# Patient Record
Sex: Male | Born: 1935 | Race: White | Hispanic: No | Smoking: Never smoker
Health system: Southern US, Community
[De-identification: ages and names within clinical notes are randomized; demographics above are authoritative.]

## PROBLEM LIST (undated history)

## (undated) DIAGNOSIS — I10 Essential (primary) hypertension: Secondary | ICD-10-CM

## (undated) DIAGNOSIS — E785 Hyperlipidemia, unspecified: Secondary | ICD-10-CM

## (undated) DIAGNOSIS — I2510 Atherosclerotic heart disease of native coronary artery without angina pectoris: Secondary | ICD-10-CM

## (undated) DIAGNOSIS — G629 Polyneuropathy, unspecified: Secondary | ICD-10-CM

## (undated) HISTORY — PX: MEDIAL PARTIAL KNEE REPLACEMENT: SHX5965

## (undated) HISTORY — PX: CORONARY ANGIOPLASTY WITH STENT PLACEMENT: SHX49

---

## 2020-01-21 ENCOUNTER — Encounter (HOSPITAL_COMMUNITY): Payer: Self-pay | Admitting: Emergency Medicine

## 2020-01-21 ENCOUNTER — Other Ambulatory Visit: Payer: Self-pay

## 2020-01-21 ENCOUNTER — Inpatient Hospital Stay (HOSPITAL_COMMUNITY)
Admission: EM | Disposition: A | Discharge status: Skilled Nursing Facility | Payer: Self-pay | Source: Home / Self Care | Attending: Thoracic Surgery (Cardiothoracic Vascular Surgery)

## 2020-01-21 ENCOUNTER — Inpatient Hospital Stay (HOSPITAL_COMMUNITY)
Admission: EM | Admit: 2020-01-21 | Discharge: 2020-02-06 | DRG: 233 | Disposition: A | Discharge status: Skilled Nursing Facility | Payer: Medicare Other | Attending: Thoracic Surgery (Cardiothoracic Vascular Surgery) | Admitting: Thoracic Surgery (Cardiothoracic Vascular Surgery)

## 2020-01-21 ENCOUNTER — Emergency Department (HOSPITAL_COMMUNITY): Payer: Medicare Other

## 2020-01-21 ENCOUNTER — Inpatient Hospital Stay (HOSPITAL_COMMUNITY): Payer: Medicare Other

## 2020-01-21 DIAGNOSIS — Z96653 Presence of artificial knee joint, bilateral: Secondary | ICD-10-CM | POA: Diagnosis present

## 2020-01-21 DIAGNOSIS — E7800 Pure hypercholesterolemia, unspecified: Secondary | ICD-10-CM

## 2020-01-21 DIAGNOSIS — E871 Hypo-osmolality and hyponatremia: Secondary | ICD-10-CM | POA: Diagnosis not present

## 2020-01-21 DIAGNOSIS — E861 Hypovolemia: Secondary | ICD-10-CM | POA: Diagnosis present

## 2020-01-21 DIAGNOSIS — N183 Chronic kidney disease, stage 3 unspecified: Secondary | ICD-10-CM | POA: Diagnosis present

## 2020-01-21 DIAGNOSIS — I9581 Postprocedural hypotension: Secondary | ICD-10-CM | POA: Diagnosis not present

## 2020-01-21 DIAGNOSIS — N179 Acute kidney failure, unspecified: Secondary | ICD-10-CM

## 2020-01-21 DIAGNOSIS — Z7901 Long term (current) use of anticoagulants: Secondary | ICD-10-CM | POA: Diagnosis not present

## 2020-01-21 DIAGNOSIS — Z955 Presence of coronary angioplasty implant and graft: Secondary | ICD-10-CM | POA: Diagnosis not present

## 2020-01-21 DIAGNOSIS — I480 Paroxysmal atrial fibrillation: Secondary | ICD-10-CM | POA: Diagnosis not present

## 2020-01-21 DIAGNOSIS — Z9889 Other specified postprocedural states: Secondary | ICD-10-CM

## 2020-01-21 DIAGNOSIS — I2510 Atherosclerotic heart disease of native coronary artery without angina pectoris: Secondary | ICD-10-CM | POA: Diagnosis present

## 2020-01-21 DIAGNOSIS — I252 Old myocardial infarction: Secondary | ICD-10-CM | POA: Diagnosis not present

## 2020-01-21 DIAGNOSIS — N184 Chronic kidney disease, stage 4 (severe): Secondary | ICD-10-CM | POA: Diagnosis not present

## 2020-01-21 DIAGNOSIS — Z20822 Contact with and (suspected) exposure to covid-19: Secondary | ICD-10-CM | POA: Diagnosis present

## 2020-01-21 DIAGNOSIS — I5043 Acute on chronic combined systolic (congestive) and diastolic (congestive) heart failure: Secondary | ICD-10-CM | POA: Diagnosis present

## 2020-01-21 DIAGNOSIS — R627 Adult failure to thrive: Secondary | ICD-10-CM | POA: Diagnosis present

## 2020-01-21 DIAGNOSIS — I340 Nonrheumatic mitral (valve) insufficiency: Secondary | ICD-10-CM

## 2020-01-21 DIAGNOSIS — E1122 Type 2 diabetes mellitus with diabetic chronic kidney disease: Secondary | ICD-10-CM | POA: Diagnosis present

## 2020-01-21 DIAGNOSIS — D62 Acute posthemorrhagic anemia: Secondary | ICD-10-CM | POA: Diagnosis not present

## 2020-01-21 DIAGNOSIS — I513 Intracardiac thrombosis, not elsewhere classified: Secondary | ICD-10-CM | POA: Diagnosis present

## 2020-01-21 DIAGNOSIS — N390 Urinary tract infection, site not specified: Secondary | ICD-10-CM | POA: Diagnosis not present

## 2020-01-21 DIAGNOSIS — Z452 Encounter for adjustment and management of vascular access device: Secondary | ICD-10-CM

## 2020-01-21 DIAGNOSIS — I351 Nonrheumatic aortic (valve) insufficiency: Secondary | ICD-10-CM | POA: Diagnosis not present

## 2020-01-21 DIAGNOSIS — Z6826 Body mass index (BMI) 26.0-26.9, adult: Secondary | ICD-10-CM | POA: Diagnosis not present

## 2020-01-21 DIAGNOSIS — L8990 Pressure ulcer of unspecified site, unspecified stage: Secondary | ICD-10-CM | POA: Insufficient documentation

## 2020-01-21 DIAGNOSIS — J939 Pneumothorax, unspecified: Secondary | ICD-10-CM

## 2020-01-21 DIAGNOSIS — D696 Thrombocytopenia, unspecified: Secondary | ICD-10-CM | POA: Diagnosis not present

## 2020-01-21 DIAGNOSIS — E785 Hyperlipidemia, unspecified: Secondary | ICD-10-CM | POA: Diagnosis present

## 2020-01-21 DIAGNOSIS — I10 Essential (primary) hypertension: Secondary | ICD-10-CM | POA: Diagnosis not present

## 2020-01-21 DIAGNOSIS — I5021 Acute systolic (congestive) heart failure: Secondary | ICD-10-CM | POA: Diagnosis not present

## 2020-01-21 DIAGNOSIS — Z951 Presence of aortocoronary bypass graft: Secondary | ICD-10-CM

## 2020-01-21 DIAGNOSIS — I25110 Atherosclerotic heart disease of native coronary artery with unstable angina pectoris: Secondary | ICD-10-CM | POA: Diagnosis present

## 2020-01-21 DIAGNOSIS — I361 Nonrheumatic tricuspid (valve) insufficiency: Secondary | ICD-10-CM

## 2020-01-21 DIAGNOSIS — I214 Non-ST elevation (NSTEMI) myocardial infarction: Secondary | ICD-10-CM | POA: Diagnosis present

## 2020-01-21 DIAGNOSIS — Z8249 Family history of ischemic heart disease and other diseases of the circulatory system: Secondary | ICD-10-CM | POA: Diagnosis not present

## 2020-01-21 DIAGNOSIS — Z79899 Other long term (current) drug therapy: Secondary | ICD-10-CM

## 2020-01-21 DIAGNOSIS — J9601 Acute respiratory failure with hypoxia: Secondary | ICD-10-CM

## 2020-01-21 DIAGNOSIS — Z01810 Encounter for preprocedural cardiovascular examination: Secondary | ICD-10-CM | POA: Diagnosis not present

## 2020-01-21 DIAGNOSIS — I255 Ischemic cardiomyopathy: Secondary | ICD-10-CM | POA: Diagnosis present

## 2020-01-21 DIAGNOSIS — I130 Hypertensive heart and chronic kidney disease with heart failure and stage 1 through stage 4 chronic kidney disease, or unspecified chronic kidney disease: Secondary | ICD-10-CM | POA: Diagnosis present

## 2020-01-21 DIAGNOSIS — Z4682 Encounter for fitting and adjustment of non-vascular catheter: Secondary | ICD-10-CM

## 2020-01-21 DIAGNOSIS — I200 Unstable angina: Secondary | ICD-10-CM

## 2020-01-21 DIAGNOSIS — J90 Pleural effusion, not elsewhere classified: Secondary | ICD-10-CM

## 2020-01-21 DIAGNOSIS — R079 Chest pain, unspecified: Secondary | ICD-10-CM | POA: Diagnosis present

## 2020-01-21 DIAGNOSIS — E876 Hypokalemia: Secondary | ICD-10-CM | POA: Diagnosis not present

## 2020-01-21 DIAGNOSIS — G629 Polyneuropathy, unspecified: Secondary | ICD-10-CM

## 2020-01-21 HISTORY — DX: Polyneuropathy, unspecified: G62.9

## 2020-01-21 HISTORY — DX: Hyperlipidemia, unspecified: E78.5

## 2020-01-21 HISTORY — PX: LEFT HEART CATH AND CORONARY ANGIOGRAPHY: CATH118249

## 2020-01-21 HISTORY — DX: Essential (primary) hypertension: I10

## 2020-01-21 HISTORY — DX: Atherosclerotic heart disease of native coronary artery without angina pectoris: I25.10

## 2020-01-21 HISTORY — PX: CORONARY/GRAFT ACUTE MI REVASCULARIZATION: CATH118305

## 2020-01-21 LAB — ECHOCARDIOGRAM COMPLETE
AR max vel: 2.08 cm2
AV Area VTI: 2.18 cm2
AV Area mean vel: 1.78 cm2
AV Mean grad: 5.4 mmHg
AV Peak grad: 8.9 mmHg
Ao pk vel: 1.49 m/s
Area-P 1/2: 6.6 cm2
Height: 69.5 in
MV M vel: 4.59 m/s
MV Peak grad: 84.3 mmHg
P 1/2 time: 454 msec
Radius: 0.3 cm
S' Lateral: 3.65 cm
Weight: 2867.74 oz

## 2020-01-21 LAB — COMPREHENSIVE METABOLIC PANEL
ALT: 27 U/L (ref 0–44)
AST: 44 U/L — ABNORMAL HIGH (ref 15–41)
Albumin: 3.8 g/dL (ref 3.5–5.0)
Alkaline Phosphatase: 81 U/L (ref 38–126)
Anion gap: 15 (ref 5–15)
BUN: 45 mg/dL — ABNORMAL HIGH (ref 8–23)
CO2: 19 mmol/L — ABNORMAL LOW (ref 22–32)
Calcium: 9.9 mg/dL (ref 8.9–10.3)
Chloride: 103 mmol/L (ref 98–111)
Creatinine, Ser: 1.8 mg/dL — ABNORMAL HIGH (ref 0.61–1.24)
GFR calc Af Amer: 38 mL/min — ABNORMAL LOW (ref >60)
GFR calc non Af Amer: 33 mL/min — ABNORMAL LOW (ref >60)
Glucose, Bld: 249 mg/dL — ABNORMAL HIGH (ref 70–99)
Potassium: 4 mmol/L (ref 3.5–5.1)
Sodium: 137 mmol/L (ref 135–145)
Total Bilirubin: 1.2 mg/dL (ref 0.3–1.2)
Total Protein: 7 g/dL (ref 6.5–8.1)

## 2020-01-21 LAB — SARS CORONAVIRUS 2 BY RT PCR (HOSPITAL ORDER, PERFORMED IN ~~LOC~~ HOSPITAL LAB): SARS Coronavirus 2: NEGATIVE

## 2020-01-21 LAB — HEMOGLOBIN A1C
Hgb A1c MFr Bld: 6 % — ABNORMAL HIGH (ref 4.8–5.6)
Mean Plasma Glucose: 125.5 mg/dL

## 2020-01-21 LAB — LIPID PANEL
Cholesterol: 181 mg/dL (ref 0–200)
HDL: 61 mg/dL (ref >40)
LDL Cholesterol: 107 mg/dL — ABNORMAL HIGH (ref 0–99)
Total CHOL/HDL Ratio: 3 RATIO
Triglycerides: 63 mg/dL (ref <150)
VLDL: 13 mg/dL (ref 0–40)

## 2020-01-21 LAB — TROPONIN I (HIGH SENSITIVITY)
Troponin I (High Sensitivity): 1825 ng/L (ref <18)
Troponin I (High Sensitivity): 4392 ng/L (ref <18)
Troponin I (High Sensitivity): 5974 ng/L (ref <18)

## 2020-01-21 LAB — HEPARIN LEVEL (UNFRACTIONATED): Heparin Unfractionated: 0.2 IU/mL — ABNORMAL LOW (ref 0.30–0.70)

## 2020-01-21 LAB — CBC
HCT: 41.2 % (ref 39.0–52.0)
Hemoglobin: 13.5 g/dL (ref 13.0–17.0)
MCH: 31.8 pg (ref 26.0–34.0)
MCHC: 32.8 g/dL (ref 30.0–36.0)
MCV: 97.2 fL (ref 80.0–100.0)
Platelets: 328 10*3/uL (ref 150–400)
RBC: 4.24 MIL/uL (ref 4.22–5.81)
RDW: 14.4 % (ref 11.5–15.5)
WBC: 13.8 10*3/uL — ABNORMAL HIGH (ref 4.0–10.5)
nRBC: 0 % (ref 0.0–0.2)

## 2020-01-21 LAB — GLUCOSE, CAPILLARY
Glucose-Capillary: 210 mg/dL — ABNORMAL HIGH (ref 70–99)
Glucose-Capillary: 188 mg/dL — ABNORMAL HIGH (ref 70–99)

## 2020-01-21 LAB — PROTIME-INR
INR: 1 (ref 0.8–1.2)
Prothrombin Time: 12.4 seconds (ref 11.4–15.2)

## 2020-01-21 LAB — MRSA PCR SCREENING: MRSA by PCR: NEGATIVE

## 2020-01-21 LAB — APTT: aPTT: 33 seconds (ref 24–36)

## 2020-01-21 LAB — BRAIN NATRIURETIC PEPTIDE: B Natriuretic Peptide: 393.8 pg/mL — ABNORMAL HIGH (ref 0.0–100.0)

## 2020-01-21 SURGERY — LEFT HEART CATH AND CORONARY ANGIOGRAPHY
Anesthesia: LOCAL

## 2020-01-21 MED ORDER — SODIUM CHLORIDE 0.9% FLUSH: 3.0000 mL | INTRAVENOUS | Status: DC | PRN

## 2020-01-21 MED ORDER — SODIUM CHLORIDE 0.9 % IV SOLN: INTRAVENOUS | Status: AC

## 2020-01-21 MED ORDER — NITROGLYCERIN IN D5W 200-5 MCG/ML-% IV SOLN
5.0000 ug/min | INTRAVENOUS | Status: DC
  Administered 2020-01-21: 5 ug/min via INTRAVENOUS

## 2020-01-21 MED ORDER — IOHEXOL 350 MG/ML SOLN
INTRAVENOUS | Status: DC | PRN
  Administered 2020-01-21: 50 mL

## 2020-01-21 MED ORDER — MIDAZOLAM HCL 2 MG/2ML IJ SOLN
INTRAMUSCULAR | Status: AC
  Filled 2020-01-21: qty 2

## 2020-01-21 MED ORDER — HEPARIN (PORCINE) IN NACL 1000-0.9 UT/500ML-% IV SOLN
INTRAVENOUS | Status: AC
  Filled 2020-01-21: qty 1500

## 2020-01-21 MED ORDER — HYDRALAZINE HCL 20 MG/ML IJ SOLN: 10.0000 mg | INTRAMUSCULAR | Status: AC | PRN

## 2020-01-21 MED ORDER — MIDAZOLAM HCL 2 MG/2ML IJ SOLN
INTRAMUSCULAR | Status: DC | PRN
  Administered 2020-01-21: 0.5 mg via INTRAVENOUS

## 2020-01-21 MED ORDER — ONDANSETRON HCL 4 MG/2ML IJ SOLN
INTRAMUSCULAR | Status: AC
  Administered 2020-01-21: 4 mg via INTRAVENOUS
  Filled 2020-01-21: qty 2

## 2020-01-21 MED ORDER — ASPIRIN 81 MG PO CHEW
324.0000 mg | CHEWABLE_TABLET | Freq: Once | ORAL | Status: AC
  Administered 2020-01-21: 324 mg via ORAL

## 2020-01-21 MED ORDER — NITROGLYCERIN 1 MG/10 ML FOR IR/CATH LAB
INTRA_ARTERIAL | Status: AC
  Filled 2020-01-21: qty 10

## 2020-01-21 MED ORDER — PROSIGHT PO TABS
1.0000 | ORAL_TABLET | Freq: Two times a day (BID) | ORAL | Status: DC
  Administered 2020-01-21 – 2020-02-06 (×31): 1 via ORAL
  Filled 2020-01-21 (×33): qty 1

## 2020-01-21 MED ORDER — VERAPAMIL HCL 2.5 MG/ML IV SOLN
INTRAVENOUS | Status: DC | PRN
  Administered 2020-01-21: 10 mL via INTRA_ARTERIAL

## 2020-01-21 MED ORDER — LIDOCAINE HCL (PF) 1 % IJ SOLN
INTRAMUSCULAR | Status: AC
  Filled 2020-01-21: qty 30

## 2020-01-21 MED ORDER — LABETALOL HCL 5 MG/ML IV SOLN: 10.0000 mg | INTRAVENOUS | Status: AC | PRN

## 2020-01-21 MED ORDER — NITROGLYCERIN IN D5W 200-5 MCG/ML-% IV SOLN
INTRAVENOUS | Status: AC | PRN
  Administered 2020-01-21: 5 ug/min via INTRAVENOUS

## 2020-01-21 MED ORDER — CHLORHEXIDINE GLUCONATE 0.12 % MT SOLN
15.0000 mL | Freq: Two times a day (BID) | OROMUCOSAL | Status: DC
  Administered 2020-01-21 – 2020-01-22 (×4): 15 mL via OROMUCOSAL
  Filled 2020-01-21 (×3): qty 15

## 2020-01-21 MED ORDER — ONDANSETRON HCL 4 MG/2ML IJ SOLN
4.0000 mg | Freq: Four times a day (QID) | INTRAMUSCULAR | Status: DC | PRN
  Administered 2020-01-21: 4 mg via INTRAVENOUS
  Filled 2020-01-21 (×2): qty 2

## 2020-01-21 MED ORDER — HEPARIN SODIUM (PORCINE) 1000 UNIT/ML IJ SOLN
INTRAMUSCULAR | Status: AC
  Filled 2020-01-21: qty 1

## 2020-01-21 MED ORDER — NITROGLYCERIN 0.4 MG SL SUBL: 0.4000 mg | SUBLINGUAL_TABLET | SUBLINGUAL | Status: DC | PRN

## 2020-01-21 MED ORDER — HEPARIN SODIUM (PORCINE) 5000 UNIT/ML IJ SOLN
4000.0000 [IU] | Freq: Once | INTRAMUSCULAR | Status: AC
  Administered 2020-01-21: 4000 [IU] via INTRAVENOUS

## 2020-01-21 MED ORDER — LIDOCAINE HCL (PF) 1 % IJ SOLN
INTRAMUSCULAR | Status: DC | PRN
  Administered 2020-01-21: 2 mL

## 2020-01-21 MED ORDER — ATORVASTATIN CALCIUM 80 MG PO TABS
80.0000 mg | ORAL_TABLET | Freq: Every day | ORAL | Status: DC
  Administered 2020-01-22 – 2020-02-06 (×16): 80 mg via ORAL
  Filled 2020-01-21 (×16): qty 1

## 2020-01-21 MED ORDER — MELATONIN 3 MG PO TABS
3.0000 mg | ORAL_TABLET | Freq: Every day | ORAL | Status: DC
  Administered 2020-01-21 – 2020-02-05 (×16): 3 mg via ORAL
  Filled 2020-01-21 (×17): qty 1

## 2020-01-21 MED ORDER — NITROGLYCERIN IN D5W 200-5 MCG/ML-% IV SOLN
INTRAVENOUS | Status: AC
  Filled 2020-01-21: qty 250

## 2020-01-21 MED ORDER — HEPARIN (PORCINE) 25000 UT/250ML-% IV SOLN
1100.0000 [IU]/h | INTRAVENOUS | Status: DC
  Filled 2020-01-21: qty 250

## 2020-01-21 MED ORDER — CHLORHEXIDINE GLUCONATE CLOTH 2 % EX PADS
6.0000 | MEDICATED_PAD | Freq: Every day | CUTANEOUS | Status: DC
  Administered 2020-01-21: 6 via TOPICAL

## 2020-01-21 MED ORDER — ACETAMINOPHEN 325 MG PO TABS
650.0000 mg | ORAL_TABLET | ORAL | Status: DC | PRN
  Administered 2020-01-22 (×3): 650 mg via ORAL
  Filled 2020-01-21 (×3): qty 2

## 2020-01-21 MED ORDER — HEPARIN BOLUS VIA INFUSION
1000.0000 [IU] | Freq: Once | INTRAVENOUS | Status: AC
  Administered 2020-01-21: 1000 [IU] via INTRAVENOUS
  Filled 2020-01-21: qty 1000

## 2020-01-21 MED ORDER — SODIUM CHLORIDE 0.9% FLUSH
3.0000 mL | Freq: Two times a day (BID) | INTRAVENOUS | Status: DC
  Administered 2020-01-21 – 2020-01-22 (×4): 3 mL via INTRAVENOUS

## 2020-01-21 MED ORDER — MELATONIN 3 MG PO TABS: 3.0000 mg | ORAL_TABLET | Freq: Every day | ORAL | Status: DC

## 2020-01-21 MED ORDER — PROCHLORPERAZINE EDISYLATE 10 MG/2ML IJ SOLN
10.0000 mg | INTRAMUSCULAR | Status: DC | PRN
  Administered 2020-01-21 – 2020-01-22 (×3): 10 mg via INTRAVENOUS
  Filled 2020-01-21 (×4): qty 2

## 2020-01-21 MED ORDER — SODIUM CHLORIDE 0.9% FLUSH: 3.0000 mL | Freq: Once | INTRAVENOUS | Status: DC

## 2020-01-21 MED ORDER — PERFLUTREN LIPID MICROSPHERE
1.0000 mL | INTRAVENOUS | Status: AC | PRN
  Administered 2020-01-21: 3 mL via INTRAVENOUS
  Filled 2020-01-21: qty 10

## 2020-01-21 MED ORDER — ORAL CARE MOUTH RINSE
15.0000 mL | Freq: Two times a day (BID) | OROMUCOSAL | Status: DC
  Administered 2020-01-21 – 2020-01-22 (×4): 15 mL via OROMUCOSAL

## 2020-01-21 MED ORDER — ONDANSETRON HCL 4 MG/2ML IJ SOLN
4.0000 mg | Freq: Once | INTRAMUSCULAR | Status: AC
  Administered 2020-01-21: 4 mg via INTRAVENOUS

## 2020-01-21 MED ORDER — HEPARIN SODIUM (PORCINE) 1000 UNIT/ML IJ SOLN
INTRAMUSCULAR | Status: DC | PRN
  Administered 2020-01-21: 4000 [IU] via INTRAVENOUS

## 2020-01-21 MED ORDER — HEPARIN (PORCINE) IN NACL 1000-0.9 UT/500ML-% IV SOLN
INTRAVENOUS | Status: DC | PRN
  Administered 2020-01-21 (×2): 500 mL

## 2020-01-21 MED ORDER — ASPIRIN 81 MG PO CHEW
81.0000 mg | CHEWABLE_TABLET | Freq: Every day | ORAL | Status: DC
  Administered 2020-01-22: 81 mg via ORAL
  Filled 2020-01-21 (×2): qty 1

## 2020-01-21 MED ORDER — VERAPAMIL HCL 2.5 MG/ML IV SOLN
INTRAVENOUS | Status: AC
  Filled 2020-01-21: qty 2

## 2020-01-21 MED ORDER — FENTANYL CITRATE (PF) 100 MCG/2ML IJ SOLN
INTRAMUSCULAR | Status: DC | PRN
  Administered 2020-01-21: 25 ug via INTRAVENOUS

## 2020-01-21 MED ORDER — FENTANYL CITRATE (PF) 100 MCG/2ML IJ SOLN
INTRAMUSCULAR | Status: AC
  Filled 2020-01-21: qty 2

## 2020-01-21 MED ORDER — SODIUM CHLORIDE 0.9 % IV SOLN: 250.0000 mL | INTRAVENOUS | Status: DC | PRN

## 2020-01-21 MED ORDER — HEPARIN (PORCINE) 25000 UT/250ML-% IV SOLN
1250.0000 [IU]/h | INTRAVENOUS | Status: DC
  Administered 2020-01-21: 1100 [IU]/h via INTRAVENOUS
  Filled 2020-01-21 (×2): qty 250

## 2020-01-21 SURGICAL SUPPLY — 10 items
CATH 5FR JL3.5 JR4 ANG PIG MP (CATHETERS) ×2 IMPLANT
DEVICE RAD COMP TR BAND LRG (VASCULAR PRODUCTS) ×2 IMPLANT
GLIDESHEATH SLEND A-KIT 6F 22G (SHEATH) ×2 IMPLANT
GUIDEWIRE INQWIRE 1.5J.035X260 (WIRE) ×1 IMPLANT
INQWIRE 1.5J .035X260CM (WIRE) ×2
KIT HEART LEFT (KITS) ×2 IMPLANT
PACK CARDIAC CATHETERIZATION (CUSTOM PROCEDURE TRAY) ×2 IMPLANT
SHEATH PROBE COVER 6X72 (BAG) ×2 IMPLANT
TRANSDUCER W/STOPCOCK (MISCELLANEOUS) ×2 IMPLANT
TUBING CIL FLEX 10 FLL-RA (TUBING) ×2 IMPLANT

## 2020-01-21 NOTE — Progress Notes (Signed)
Progress Note  Patient Name: Bryan Dunn Date of Encounter: 01/21/2020  [facility] Cardiologist: Fransico Him, MD   Subjective   Patient admitted early this morning with epigastric pain nausea vomiting diarrhea and chest pain.  He was brought to the Cath Lab earlier this morning by Dr. Tamala Julian who revealed three-vessel calcification with left main/three-vessel disease with moderate LV dysfunction.  He was thought not to be a percutaneous interventional candidate and [facility] was consulted.  He continues to have epigastric discomfort with borderline hypotension.  Inpatient Medications    Scheduled Meds: . aspirin  81 mg Oral Daily  . atorvastatin  80 mg Oral Daily  . chlorhexidine  15 mL Mouth Rinse BID  . mouth rinse  15 mL Mouth Rinse q12n4p  . melatonin  3 mg Oral QHS  . ondansetron      . sodium chloride flush  3 mL Intravenous Once  . sodium chloride flush  3 mL Intravenous Q12H   Continuous Infusions: . sodium chloride    . heparin    . nitroGLYCERIN 5 mcg/min (01/21/20 0515)   PRN Meds: sodium chloride, acetaminophen, hydrALAZINE, labetalol, nitroGLYCERIN, ondansetron (ZOFRAN) IV, sodium chloride flush   Vital Signs    Vitals:   01/21/20 0800 01/21/20 0830 01/21/20 0900 01/21/20 0930  BP: 106/80 100/67 (!) 96/62 91/69  Pulse: 99 100 102 101  Resp: 21 21 20 16   Temp:      TempSrc:      SpO2: 96% 92% (!) 89% 92%  Weight:      Height:        Intake/Output Summary (Last 24 hours) at 01/21/2020 1009 Last data filed at 01/21/2020 0956 Gross per 24 hour  Intake --  Output 1 ml  Net -1 ml   Last 3 Weights 01/21/2020 01/21/2020  Weight (lbs) 179 lb 3.7 oz 174 lb  Weight (kg) 81.3 kg 78.926 kg      Telemetry    Sinus rhythm- Personally Reviewed  ECG    Sinus tachycardia 108 with ST segment elevation in lead aVR in lead III and diffuse ST segment depression otherwise.- Personally Reviewed  Physical Exam   GEN: No acute distress.   Neck: No JVD Cardiac:  RRR, no murmurs, rubs, or gallops.  Respiratory: Clear to auscultation bilaterally. GI: Soft, nontender, non-distended  MS: No edema; No deformity. Neuro:  Nonfocal  Psych: Normal affect  Extremities: Right radial puncture site stable  Labs    High Sensitivity Troponin:   Recent Labs  Lab 01/21/20 0325 01/21/20 0702 01/21/20 0851  TROPONINIHS 1,825* 4,392* 5,974*      Chemistry Recent Labs  Lab 01/21/20 0325  NA 137  K 4.0  CL 103  CO2 19*  GLUCOSE 249*  BUN 45*  CREATININE 1.80*  CALCIUM 9.9  PROT 7.0  ALBUMIN 3.8  AST 44*  ALT 27  ALKPHOS 81  BILITOT 1.2  GFRNONAA 33*  GFRAA 38*  ANIONGAP 15     Hematology Recent Labs  Lab 01/21/20 0325  WBC 13.8*  RBC 4.24  HGB 13.5  HCT 41.2  MCV 97.2  MCH 31.8  MCHC 32.8  RDW 14.4  PLT 328    BNP Recent Labs  Lab 01/21/20 0702  BNP 393.8*     DDimer No results for input(s): DDIMER in the last 168 hours.   Radiology    CARDIAC CATHETERIZATION  Result Date: 01/21/2020  Heavily calcified severe three-vessel coronary artery disease.  Decreased LVEF, 35 to 40% with anteroapical  moderate hypokinesis.  LVEDP 22%.  Acute on chronic combined systolic and diastolic heart failure.  30% proximal left main  Heavily calcified 95% proximal LAD followed by total occlusion in the mid vessel.  99% ostial to proximal circumflex proximal to a previously placed stent which is patent.  2 large obtuse marginal branches are noted distally.  RCA is dominant.  There is eccentric 85% possible acute lesion in the RCA.  Segmental 75 to 90% distal RCA with severe diffuse disease in the PDA and LV branches with left to right collaterals.  Large acute marginal branch supplies collaterals around the apex to the LAD. RECOMMENDATIONS:  Guarded prognosis given age, LV function, and multifocal high-grade three-vessel CAD.  Needs to be evaluated to determine if he is a surgical candidate.  IV nitroglycerin  Resume IV heparin  Cycle  cardiac markers   DG Chest Portable 1 View  Result Date: 01/21/2020 CLINICAL DATA:  Chest pain EXAM: PORTABLE CHEST 1 VIEW COMPARISON:  None. FINDINGS: Cardiac shadow is at the upper limits of normal in size. Increased vascular congestion with interstitial edema is noted consistent with CHF. No bony abnormality is seen. No sizable effusion is noted. IMPRESSION: Changes of CHF. Electronically Signed   By: Inez Catalina M.D.   On: 01/21/2020 03:56    Cardiac Studies   Chronic catheterization (01/21/2020)  Conclusion   Heavily calcified severe three-vessel coronary artery disease.  Decreased LVEF, 35 to 40% with anteroapical moderate hypokinesis.  LVEDP 22%.  Acute on chronic combined systolic and diastolic heart failure.  30% proximal left main  Heavily calcified 95% proximal LAD followed by total occlusion in the mid vessel.  99% ostial to proximal circumflex proximal to a previously placed stent which is patent.  2 large obtuse marginal branches are noted distally.  RCA is dominant.  There is eccentric 85% possible acute lesion in the RCA.  Segmental 75 to 90% distal RCA with severe diffuse disease in the PDA and LV branches with left to right collaterals.  Large acute marginal branch supplies collaterals around the apex to the LAD.  RECOMMENDATIONS:   Guarded prognosis given age, LV function, and multifocal high-grade three-vessel CAD.  Needs to be evaluated to determine if he is a surgical candidate.  IV nitroglycerin  Resume IV heparin  Cycle cardiac markers  Coronary Diagrams  Diagnostic Dominance: Right  Intervention  Implants      Patient Profile     86 y.o. male married Caucasian male who lives independently with his wife.  He goes to the gym 3 days a week.  He does have a history remote CAD status post intervention in [place] over 20 years ago.  He has treated hypertension hyperlipidemia.  He is had recent chest pain with pain that was worse last  night/this morning and was brought to the ER where he was evaluated by Dr. Radford Pax and take him urgently to the Cath Lab by Dr. Tamala Julian revealing highly calcified vessels with severe left main/three-vessel disease and moderate LV dysfunction.  [facility] was consulted.  Assessment & Plan    1: Coronary artery disease-troponin is 4000.  His EKG shows ST segment elevation in lead aVR with depression otherwise suggesting global subendocardial ischemia.  Cath revealed severe calcified three-vessel disease with severe left main and subtotally occluded ostial circumflex, subtotally occluded mid LAD with right to left collaterals.  His anatomy is not suitable for percutaneous intervention.  He is high risk for CABG but this may be his only option.  He is otherwise independent.  Dr. Roxy Manns from [facility] is going to be seeing him.  He apparently had PCI over 20 years ago with a stent in his proximal circumflex.  Go to restart his IV heparin without a bolus.  He is on low-dose nitroglycerin as well.  2: Essential hypertension-on ARB and diltiazem as an outpatient.  His blood pressure is soft and these are currently on hold.  3: Hyperlipidemia-on atorvastatin with total cholesterol 181 and HDL 107.  For questions or updates, please contact [place] Please consult [email or web address] for contact info under        Signed, Quay Burow, MD  01/21/2020, 10:09 AM

## 2020-01-21 NOTE — Progress Notes (Signed)
  Echocardiogram 2D Echocardiogram has been performed.  Jennette Dubin 01/21/2020, 4:42 PM

## 2020-01-21 NOTE — ED Notes (Signed)
Cardiology at bedside.

## 2020-01-21 NOTE — ED Provider Notes (Signed)
Emergency Department Provider Note   I have reviewed the triage vital signs and the nursing notes.   HISTORY  Chief Complaint Chest Pain and Code STEMI   HPI Bryan Dunn is a 86 y.o. male with past history of hypertension and high cholesterol presents with several nights of discomfort in the chest and shoulder along with nausea.  He came in tonight because he was having trouble sleeping due to the symptoms.  Symptoms occur mainly at night over the past 3 days.  He recently started some extended release diltiazem which he takes at night and had initially attributed the symptoms to that medication.  He is not having diaphoresis.  His discomfort at this time is 3 out of 10 in severity and nonradiating.  He has no prior history of CAD and does not have a cardiologist.  Past Medical History:  Diagnosis Date  . Benign essential HTN   . CAD (coronary artery disease)    remote PCI > 20 years ago in [place]  . HLD (hyperlipidemia)   . Neuropathy     Patient Active Problem List   Diagnosis Date Noted  . Unstable angina ([place]) 01/21/2020  . CAD in native artery 01/21/2020  . NSTEMI (non-ST elevated myocardial infarction) ([place]) 01/21/2020  . Benign essential HTN   . HLD (hyperlipidemia)   . Neuropathy   . CAD (coronary artery disease)     Past Surgical History:  Procedure Laterality Date  . CORONARY ANGIOPLASTY WITH STENT PLACEMENT    . CORONARY/GRAFT ACUTE MI REVASCULARIZATION N/A 01/21/2020   Procedure: Coronary/Graft Acute MI Revascularization;  Surgeon: Belva Crome, MD;  Location: [facility];  Service: Cardiovascular;  Laterality: N/A;  . LEFT HEART CATH AND CORONARY ANGIOGRAPHY N/A 01/21/2020   Procedure: LEFT HEART CATH AND CORONARY ANGIOGRAPHY;  Surgeon: Belva Crome, MD;  Location: [facility];  Service: Cardiovascular;  Laterality: N/A;  . MEDIAL PARTIAL KNEE REPLACEMENT Bilateral     Allergies Patient has no known allergies.  Family History  Problem  Relation Age of Onset  . CAD Father     Social History Social History   Tobacco Use  . Smoking status: Not on file  Substance Use Topics  . Alcohol use: Not on file  . Drug use: Not on file    Review of Systems  Constitutional: No fever/chills Eyes: No visual changes. ENT: No sore throat. Cardiovascular: Positive chest pain. Respiratory: Denies shortness of breath. Gastrointestinal: No abdominal pain.  Positive nausea, vomiting, and diarrhea.  No constipation. Genitourinary: Negative for dysuria. Musculoskeletal: Negative for back pain. Skin: Negative for rash. Neurological: Negative for headaches, focal weakness or numbness.  10-point ROS otherwise negative.  ____________________________________________   PHYSICAL EXAM:  VITAL SIGNS: ED Triage Vitals  Enc Vitals Group     BP 01/21/20 0307 126/82     Pulse Rate 01/21/20 0307 (!) 108     Resp 01/21/20 0307 18     Temp 01/21/20 0307 97.9 F (36.6 C)     Temp Source 01/21/20 0307 Oral     SpO2 01/21/20 0307 96 %     Weight 01/21/20 0307 174 lb (78.9 kg)     Height 01/21/20 0307 5' 9.5" (1.765 m)   Constitutional: Alert and oriented. Well appearing and in no acute distress. Eyes: Conjunctivae are normal.  Head: Atraumatic. Nose: No congestion/rhinnorhea. Mouth/Throat: Mucous membranes are moist.   Neck: No stridor.   Cardiovascular: Normal rate, regular rhythm. Good peripheral circulation. Grossly normal  heart sounds.   Respiratory: Normal respiratory effort.  No retractions. Lungs CTAB. Gastrointestinal: Soft and nontender. No distention.  Musculoskeletal: No lower extremity tenderness nor edema. No gross deformities of extremities. Neurologic:  Normal speech and language. No gross focal neurologic deficits are appreciated.  Skin:  Skin is warm, dry and intact. No rash noted.  ____________________________________________   LABS (all labs ordered are listed, but only abnormal results are displayed)  Labs  Reviewed  CBC - Abnormal; Notable for the following components:      Result Value   WBC 13.8 (*)    All other components within normal limits  HEMOGLOBIN A1C - Abnormal; Notable for the following components:   Hgb A1c MFr Bld 6.0 (*)    All other components within normal limits  COMPREHENSIVE METABOLIC PANEL - Abnormal; Notable for the following components:   CO2 19 (*)    Glucose, Bld 249 (*)    BUN 45 (*)    Creatinine, Ser 1.80 (*)    AST 44 (*)    GFR calc non Af Amer 33 (*)    GFR calc Af Amer 38 (*)    All other components within normal limits  LIPID PANEL - Abnormal; Notable for the following components:   LDL Cholesterol 107 (*)    All other components within normal limits  BRAIN NATRIURETIC PEPTIDE - Abnormal; Notable for the following components:   B Natriuretic Peptide 393.8 (*)    All other components within normal limits  HEPARIN LEVEL (UNFRACTIONATED) - Abnormal; Notable for the following components:   Heparin Unfractionated 0.20 (*)    All other components within normal limits  GLUCOSE, CAPILLARY - Abnormal; Notable for the following components:   Glucose-Capillary 210 (*)    All other components within normal limits  CBC - Abnormal; Notable for the following components:   WBC 13.4 (*)    RBC 4.08 (*)    All other components within normal limits  COMPREHENSIVE METABOLIC PANEL - Abnormal; Notable for the following components:   CO2 19 (*)    Glucose, Bld 179 (*)    BUN 43 (*)    Creatinine, Ser 1.37 (*)    Total Protein 6.3 (*)    Albumin 3.3 (*)    AST 105 (*)    Total Bilirubin 1.4 (*)    GFR calc non Af Amer 46 (*)    GFR calc Af Amer 53 (*)    All other components within normal limits  LIPID PANEL - Abnormal; Notable for the following components:   LDL Cholesterol 112 (*)    All other components within normal limits  GLUCOSE, CAPILLARY - Abnormal; Notable for the following components:   Glucose-Capillary 188 (*)    All other components within normal  limits  GLUCOSE, CAPILLARY - Abnormal; Notable for the following components:   Glucose-Capillary 168 (*)    All other components within normal limits  GLUCOSE, CAPILLARY - Abnormal; Notable for the following components:   Glucose-Capillary 212 (*)    All other components within normal limits  GLUCOSE, CAPILLARY - Abnormal; Notable for the following components:   Glucose-Capillary 215 (*)    All other components within normal limits  HEPARIN LEVEL (UNFRACTIONATED) - Abnormal; Notable for the following components:   Heparin Unfractionated 0.20 (*)    All other components within normal limits  COMPREHENSIVE METABOLIC PANEL - Abnormal; Notable for the following components:   Sodium 134 (*)    CO2 20 (*)    Glucose, Bld  190 (*)    BUN 53 (*)    Creatinine, Ser 1.66 (*)    Albumin 3.2 (*)    AST 64 (*)    Total Bilirubin 1.6 (*)    GFR calc non Af Amer 37 (*)    GFR calc Af Amer 42 (*)    All other components within normal limits  CBC - Abnormal; Notable for the following components:   WBC 14.9 (*)    RBC 4.10 (*)    All other components within normal limits  GLUCOSE, CAPILLARY - Abnormal; Notable for the following components:   Glucose-Capillary 179 (*)    All other components within normal limits  HEMOGLOBIN AND HEMATOCRIT, BLOOD - Abnormal; Notable for the following components:   Hemoglobin 8.8 (*)    HCT 25.7 (*)    All other components within normal limits  CBC - Abnormal; Notable for the following components:   WBC 15.5 (*)    RBC 2.86 (*)    Hemoglobin 9.2 (*)    HCT 28.1 (*)    Platelets 141 (*)    All other components within normal limits  PROTIME-INR - Abnormal; Notable for the following components:   Prothrombin Time 18.2 (*)    INR 1.6 (*)    All other components within normal limits  APTT - Abnormal; Notable for the following components:   aPTT 40 (*)    All other components within normal limits  CBC - Abnormal; Notable for the following components:   WBC  11.6 (*)    RBC 3.16 (*)    Hemoglobin 10.1 (*)    HCT 30.7 (*)    Platelets 125 (*)    All other components within normal limits  BASIC METABOLIC PANEL - Abnormal; Notable for the following components:   Glucose, Bld 150 (*)    BUN 47 (*)    Creatinine, Ser 1.46 (*)    Calcium 8.0 (*)    GFR calc non Af Amer 43 (*)    GFR calc Af Amer 49 (*)    All other components within normal limits  MAGNESIUM - Abnormal; Notable for the following components:   Magnesium 3.3 (*)    All other components within normal limits  GLUCOSE, CAPILLARY - Abnormal; Notable for the following components:   Glucose-Capillary 154 (*)    All other components within normal limits  GLUCOSE, CAPILLARY - Abnormal; Notable for the following components:   Glucose-Capillary 173 (*)    All other components within normal limits  GLUCOSE, CAPILLARY - Abnormal; Notable for the following components:   Glucose-Capillary 172 (*)    All other components within normal limits  GLUCOSE, CAPILLARY - Abnormal; Notable for the following components:   Glucose-Capillary 158 (*)    All other components within normal limits  GLUCOSE, CAPILLARY - Abnormal; Notable for the following components:   Glucose-Capillary 152 (*)    All other components within normal limits  CBC - Abnormal; Notable for the following components:   RBC 2.84 (*)    Hemoglobin 9.1 (*)    HCT 27.2 (*)    Platelets 119 (*)    All other components within normal limits  BASIC METABOLIC PANEL - Abnormal; Notable for the following components:   Glucose, Bld 139 (*)    BUN 45 (*)    Creatinine, Ser 1.45 (*)    Calcium 7.9 (*)    GFR calc non Af Amer 43 (*)    GFR calc Af Amer 50 (*)  All other components within normal limits  MAGNESIUM - Abnormal; Notable for the following components:   Magnesium 2.6 (*)    All other components within normal limits  GLUCOSE, CAPILLARY - Abnormal; Notable for the following components:   Glucose-Capillary 127 (*)    All  other components within normal limits  GLUCOSE, CAPILLARY - Abnormal; Notable for the following components:   Glucose-Capillary 139 (*)    All other components within normal limits  GLUCOSE, CAPILLARY - Abnormal; Notable for the following components:   Glucose-Capillary 148 (*)    All other components within normal limits  GLUCOSE, CAPILLARY - Abnormal; Notable for the following components:   Glucose-Capillary 150 (*)    All other components within normal limits  GLUCOSE, CAPILLARY - Abnormal; Notable for the following components:   Glucose-Capillary 129 (*)    All other components within normal limits  GLUCOSE, CAPILLARY - Abnormal; Notable for the following components:   Glucose-Capillary 128 (*)    All other components within normal limits  GLUCOSE, CAPILLARY - Abnormal; Notable for the following components:   Glucose-Capillary 145 (*)    All other components within normal limits  GLUCOSE, CAPILLARY - Abnormal; Notable for the following components:   Glucose-Capillary 139 (*)    All other components within normal limits  GLUCOSE, CAPILLARY - Abnormal; Notable for the following components:   Glucose-Capillary 132 (*)    All other components within normal limits  GLUCOSE, CAPILLARY - Abnormal; Notable for the following components:   Glucose-Capillary 136 (*)    All other components within normal limits  POCT I-STAT, CHEM 8 - Abnormal; Notable for the following components:   BUN 48 (*)    Creatinine, Ser 1.40 (*)    Glucose, Bld 203 (*)    TCO2 20 (*)    All other components within normal limits  POCT I-STAT, CHEM 8 - Abnormal; Notable for the following components:   BUN 51 (*)    Creatinine, Ser 1.50 (*)    Glucose, Bld 189 (*)    Hemoglobin 12.9 (*)    HCT 38.0 (*)    All other components within normal limits  POCT I-STAT 7, (LYTES, BLD GAS, ICA,H+H) - Abnormal; Notable for the following components:   pH, Arterial 7.315 (*)    pO2, Arterial 286 (*)    Calcium, Ion 1.07  (*)    HCT 28.0 (*)    Hemoglobin 9.5 (*)    All other components within normal limits  POCT I-STAT, CHEM 8 - Abnormal; Notable for the following components:   BUN 48 (*)    Creatinine, Ser 1.40 (*)    Glucose, Bld 165 (*)    Calcium, Ion 1.10 (*)    Hemoglobin 8.8 (*)    HCT 26.0 (*)    All other components within normal limits  POCT I-STAT 7, (LYTES, BLD GAS, ICA,H+H) - Abnormal; Notable for the following components:   pO2, Arterial 281 (*)    Calcium, Ion 1.10 (*)    HCT 26.0 (*)    Hemoglobin 8.8 (*)    All other components within normal limits  POCT I-STAT, CHEM 8 - Abnormal; Notable for the following components:   BUN 44 (*)    Glucose, Bld 140 (*)    TCO2 21 (*)    Hemoglobin 8.5 (*)    HCT 25.0 (*)    All other components within normal limits  POCT I-STAT 7, (LYTES, BLD GAS, ICA,H+H) - Abnormal; Notable for the following components:  pO2, Arterial 415 (*)    HCT 26.0 (*)    Hemoglobin 8.8 (*)    All other components within normal limits  TROPONIN I (HIGH SENSITIVITY) - Abnormal; Notable for the following components:   Troponin I (High Sensitivity) 1,825 (*)    All other components within normal limits  TROPONIN I (HIGH SENSITIVITY) - Abnormal; Notable for the following components:   Troponin I (High Sensitivity) 4,392 (*)    All other components within normal limits  TROPONIN I (HIGH SENSITIVITY) - Abnormal; Notable for the following components:   Troponin I (High Sensitivity) 5,974 (*)    All other components within normal limits  SARS CORONAVIRUS 2 BY RT PCR (HOSPITAL ORDER, [facility])  MRSA PCR SCREENING  PROTIME-INR  APTT  HEPARIN LEVEL (UNFRACTIONATED)  HEPARIN LEVEL (UNFRACTIONATED)  SODIUM, URINE, RANDOM  PLATELET COUNT  BLOOD GAS, ARTERIAL  BASIC METABOLIC PANEL  MAGNESIUM  CBC  TYPE AND SCREEN  ABO/RH   ____________________________________________  EKG   EKG Interpretation  Date/Time:  Monday January 21 2020  03:31:30 EDT Ventricular Rate:  108 PR Interval:    QRS Duration: 114 QT Interval:  347 QTC Calculation: 466 R Axis:   -24 Text Interpretation: Sinus or ectopic atrial tachycardia LVH with IVCD and secondary repol abnrm Probable inferior infarct, acute >>> Acute MI <<< Confirmed by Randal Buba, April ([id number]) on 01/22/2020 8:56:00 AM       ____________________________________________  RADIOLOGY  CXR reviewed.  ____________________________________________   PROCEDURES  Procedure(s) performed:   Procedures  CRITICAL CARE Performed by: Margette Fast Total critical care time: 30 minutes Critical care time was exclusive of separately billable procedures and treating other patients. Critical care was necessary to treat or prevent imminent or life-threatening deterioration. Critical care was time spent personally by me on the following activities: development of treatment plan with patient and/or surrogate as well as nursing, discussions with consultants, evaluation of patient's response to treatment, examination of patient, obtaining history from patient or surrogate, ordering and performing treatments and interventions, ordering and review of laboratory studies, ordering and review of radiographic studies, pulse oximetry and re-evaluation of patient's condition.  Nanda Quinton, MD Emergency Medicine  ____________________________________________   INITIAL IMPRESSION / ASSESSMENT AND PLAN / ED COURSE  Pertinent labs & imaging results that were available during my care of the patient were reviewed by me and considered in my medical decision making (see chart for details).   Patient presents to the emergency department with discomfort in the chest and shoulders with associated nausea.  I was brought in EKG from triage showing ST elevation and aVR with associated depressions in 4 through 6 and 2 through aVF.  I immediately went out to triage to see the patient.  He is having some active  chest discomfort along with nausea.  I activated a code STEMI in this setting.  He is very well-appearing with normal vitals.   Discussed patient's case with Cardiology, Dr. Tamala Julian to request admission. Patient and family (if present) updated with plan. Care transferred to Cardiology service.  I reviewed all nursing notes, vitals, pertinent old records, EKGs, labs, imaging (as available).  ____________________________________________  FINAL CLINICAL IMPRESSION(S) / ED DIAGNOSES  Final diagnoses:  Acute hypoxemic respiratory failure (HCC)  S/P CABG x 4     MEDICATIONS GIVEN DURING THIS VISIT:  Medications  labetalol (NORMODYNE) injection 10 mg (has no administration in time range)  hydrALAZINE (APRESOLINE) injection 10 mg (has no administration in time range)  0.9 %  sodium chloride infusion ( Intravenous Rate/Dose Verify 01/21/20 1000)  atorvastatin (LIPITOR) tablet 80 mg ( Oral MAR Unhold 01/23/20 1307)  melatonin tablet 3 mg (3 mg Oral Not Given 01/23/20 2237)  multivitamin (PROSIGHT) tablet 1 tablet (1 tablet Oral Given 01/24/20 0036)  perflutren lipid microspheres (DEFINITY) IV suspension (3 mLs Intravenous Given 01/21/20 1612)  lip balm (CARMEX) ointment ( Topical MAR Unhold 01/23/20 1307)  levalbuterol (XOPENEX) nebulizer solution 0.63 mg ( Nebulization MAR Unhold 01/23/20 1307)  ipratropium (ATROVENT) nebulizer solution 0.5 mg ( Nebulization MAR Unhold 01/23/20 1307)  0.45 % sodium chloride infusion ( Intravenous Stopped 01/24/20 0544)  lactated ringers infusion ( Intravenous Not Given 01/23/20 1332)  lactated ringers infusion ( Intravenous IV Pump Association 01/23/20 1331)  sodium chloride flush (NS) 0.9 % injection 3 mL (has no administration in time range)  sodium chloride flush (NS) 0.9 % injection 3 mL (has no administration in time range)  0.9 %  sodium chloride infusion (250 mLs Intravenous Not Given 01/24/20 0540)  0.9 %  sodium chloride infusion ( Intravenous IV Pump  Association 01/23/20 1355)  nitroGLYCERIN 50 mg in dextrose 5 % 250 mL (0.2 mg/mL) infusion (0 mcg/min Intravenous IV Pump Association 01/23/20 1336)  phenylephrine (NEOSYNEPHRINE) 20-0.9 MG/250ML-% infusion (75 mcg/min Intravenous IV Pump Association 01/23/20 1338)  lactated ringers infusion 500 mL (has no administration in time range)  potassium chloride 10 mEq in 50 mL *CENTRAL LINE* IVPB (10 mEq Intravenous Not Given 01/23/20 1402)  cefUROXime (ZINACEF) 1.5 g in sodium chloride 0.9 % 100 mL IVPB ( Intravenous Stopped 01/24/20 0451)  acetaminophen (TYLENOL) tablet 1,000 mg (1,000 mg Oral Given 01/24/20 0505)    Or  acetaminophen (TYLENOL) 160 MG/5ML solution 1,000 mg ( Per Tube See Alternative 01/24/20 0505)  traMADol (ULTRAM) tablet 50-100 mg (has no administration in time range)  oxyCODONE (Oxy IR/ROXICODONE) immediate release tablet 5-10 mg (5 mg Oral Given 01/24/20 0505)  morphine 2 MG/ML injection 1-4 mg (has no administration in time range)  midazolam (VERSED) injection 2 mg (has no administration in time range)  docusate sodium (COLACE) capsule 200 mg (has no administration in time range)  bisacodyl (DULCOLAX) EC tablet 10 mg (has no administration in time range)    Or  bisacodyl (DULCOLAX) suppository 10 mg (has no administration in time range)  ondansetron (ZOFRAN) injection 4 mg (has no administration in time range)  metoprolol tartrate (LOPRESSOR) injection 2.5-5 mg (has no administration in time range)  metoprolol tartrate (LOPRESSOR) tablet 12.5 mg (12.5 mg Oral Not Given 01/23/20 1713)    Or  metoprolol tartrate (LOPRESSOR) 25 mg/10 mL oral suspension 12.5 mg ( Per Tube See Alternative 01/23/20 1713)  pantoprazole (PROTONIX) EC tablet 40 mg (has no administration in time range)  dexmedetomidine (PRECEDEX) 400 MCG/100ML (4 mcg/mL) infusion (0 mcg/kg/hr  82.3 kg Intravenous Stopped 01/23/20 1538)  insulin regular, human (MYXREDLIN) 100 units/ 100 mL infusion ( Intravenous Rate/Dose  Verify 01/24/20 0600)  dextrose 50 % solution 0-50 mL (has no administration in time range)  EPINEPHrine (ADRENALIN) 4 mg in NS 250 mL (0.016 mg/mL) premix infusion (7 mcg/min Intravenous New Bag/Given 01/23/20 2024)  norepinephrine (LEVOPHED) 4mg  in 272mL premix infusion (2 mcg/min Intravenous IV Pump Association 01/23/20 1900)  milrinone (PRIMACOR) 20 MG/100 ML (0.2 mg/mL) infusion (0 mcg/kg/min  82.3 kg Intravenous Hold 01/23/20 1354)  DOBUTamine (DOBUTREX) infusion 4000 mcg/mL (9.964 mcg/kg/min  82.3 kg Intravenous IV Pump Association 01/23/20 2150)  DULoxetine (CYMBALTA) DR capsule 20 mg (has no  administration in time range)  apixaban (ELIQUIS) tablet 5 mg (has no administration in time range)  aspirin EC tablet 81 mg (has no administration in time range)    Or  aspirin chewable tablet 81 mg (has no administration in time range)  sodium chloride flush (NS) 0.9 % injection 10-40 mL (10 mLs Intracatheter Given 01/23/20 2236)  sodium chloride flush (NS) 0.9 % injection 10-40 mL (10 mLs Intracatheter Given 01/23/20 1356)  Chlorhexidine Gluconate Cloth 2 % PADS 6 each (has no administration in time range)  vasopressin (PITRESSIN) 20 Units in sodium chloride 0.9 % 100 mL infusion-*FOR SHOCK* (0.04 Units/min Intravenous New Bag/Given 01/24/20 0335)  potassium chloride 10 mEq in 50 mL *CENTRAL LINE* IVPB (10 mEq Intravenous New Bag/Given 01/24/20 0653)  aspirin chewable tablet 324 mg (324 mg Oral Given 01/21/20 0333)  heparin injection 4,000 Units (4,000 Units Intravenous Given 01/21/20 0333)  ondansetron (ZOFRAN) injection 4 mg (4 mg Intravenous Given 01/21/20 0354)  ondansetron (ZOFRAN) 4 MG/2ML injection (  Not Given 01/21/20 0401)  nitroGLYCERIN 50 mg in dextrose 5 % 250 mL (0.2 mg/mL) infusion (5 mcg/min Intravenous New Bag/Given 01/21/20 0446)  heparin bolus via infusion 1,000 Units (1,000 Units Intravenous Bolus from Bag 01/21/20 1945)  furosemide (LASIX) injection 40 mg (40 mg Intravenous Given 01/22/20  0600)  furosemide (LASIX) injection 40 mg (40 mg Intravenous Given 01/22/20 1459)  chlorhexidine (PERIDEX) 0.12 % solution 15 mL (15 mLs Mouth/Throat Given 01/23/20 0518)  bisacodyl (DULCOLAX) EC tablet 5 mg (5 mg Oral Given 01/22/20 1135)  temazepam (RESTORIL) capsule 15 mg (15 mg Oral Given 01/22/20 2104)  metoprolol tartrate (LOPRESSOR) tablet 12.5 mg (12.5 mg Oral Given 01/23/20 0518)  Chlorhexidine Gluconate Cloth 2 % PADS 6 each (6 each Topical Given 01/22/20 2122)    And  Chlorhexidine Gluconate Cloth 2 % PADS 6 each (6 each Topical Given 01/22/20 1735)  dexmedetomidine (PRECEDEX) 400 MCG/100ML (4 mcg/mL) infusion (0.7 mcg/kg/hr  82.3 kg Intravenous Rate/Dose Change 01/23/20 1228)  insulin regular, human (MYXREDLIN) 100 units/ 100 mL infusion (1.1 Units/hr Intravenous Rate/Dose Change 01/23/20 1227)  EPINEPHrine (ADRENALIN) 4 mg in NS 250 mL (0.016 mg/mL) premix infusion (7 mcg/min Intravenous Rate/Dose Change 01/23/20 1200)  norepinephrine (LEVOPHED) 4mg  in 238mL premix infusion (4 mcg/min Intravenous Rate/Dose Change 01/23/20 1312)  phenylephrine (NEOSYNEPHRINE) 20-0.9 MG/250ML-% infusion (0 mcg/min Intravenous Stopped 01/23/20 1007)  tranexamic acid (CYKLOKAPRON) bolus via infusion - over 30 minutes 1,234.5 mg (1,234.5 mg Intravenous Given 01/23/20 0825)  tranexamic acid (CYKLOKAPRON) 2,500 mg in sodium chloride 0.9 % 250 mL (10 mg/mL) infusion (0 mg/kg/hr  82.3 kg Intravenous Stopped 01/23/20 1712)  vancomycin (VANCOREADY) IVPB 1500 mg/300 mL (1,500 mg Intravenous Given 01/23/20 0812)  cefUROXime (ZINACEF) 1.5 g in sodium chloride 0.9 % 100 mL IVPB (0 g Intravenous Stopped 01/23/20 1310)  cefUROXime (ZINACEF) 750 mg in sodium chloride 0.9 % 100 mL IVPB (750 mg Intravenous New Bag/Given 01/23/20 1213)  furosemide (LASIX) injection 60 mg (60 mg Intravenous Given 01/23/20 0100)  albumin human 5 % solution 12.5 g (12.5 g Intravenous New Bag/Given 01/23/20 1647)  magnesium sulfate IVPB 4 g 100 mL (  Intravenous Stopped 01/23/20 1815)  vancomycin (VANCOCIN) IVPB 1000 mg/200 mL premix ( Intravenous Stopped 01/23/20 2124)  acetaminophen (TYLENOL) 160 MG/5ML solution 650 mg (650 mg Per Tube Not Given 01/23/20 1713)    Or  acetaminophen (TYLENOL) suppository 650 mg ( Rectal See Alternative 01/23/20 1713)  famotidine (PEPCID) IVPB 20 mg premix ( Intravenous Rate/Dose Verify 01/23/20 2300)  chlorhexidine (PERIDEX) 0.12 % solution 15 mL (15 mLs Mouth/Throat Given 01/23/20 1355)  sodium bicarbonate injection 50 mEq (50 mEq Intravenous Given 01/23/20 1353)    Note:  This document was prepared using Dragon voice recognition software and may include unintentional dictation errors.  Nanda Quinton, MD, [facility] Emergency Medicine    Veltri, Wonda [facility], MD 01/24/20 0700

## 2020-01-21 NOTE — Progress Notes (Signed)
ANTICOAGULATION CONSULT NOTE - Initial Consult  Pharmacy Consult for heparin Indication: chest pain/ACS  No Known Allergies  Patient Measurements: Height: 5' 9.5" (176.5 cm) Weight: 78.9 kg (174 lb) IBW/kg (Calculated) : 71.85 Heparin Dosing Weight: 78.9 kg  Vital Signs: Temp: 97.9 F (36.6 C) (07/26 0307) Temp Source: Oral (07/26 0307) BP: 105/72 (07/26 0442) Pulse Rate: 93 (07/26 0442)  Labs: Recent Labs    01/21/20 0325  HGB 13.5  HCT 41.2  PLT 328  APTT 33  LABPROT 12.4  INR 1.0  CREATININE 1.80*  TROPONINIHS 1,825*    Estimated Creatinine Clearance: 29.4 mL/min (A) (by C-G formula based on SCr of 1.8 mg/dL (H)).   Medical History: Past Medical History:  Diagnosis Date  . Benign essential HTN   . CAD (coronary artery disease)    remote PCI > 20 years ago in [place]  . HLD (hyperlipidemia)   . Neuropathy     Medications:  No medications prior to admission.    Assessment: 86 yo man s/p cath to start heparin 8 hr after sheath pull.  Hg13.5, PTLC 328 Goal of Therapy:  Heparin level 0.3-0.7 units/ml Monitor platelets by anticoagulation protocol: Yes   Plan:  Start heparin at 12:45 at 1100 units/hr Check heparin level 6-8 hours after start  Bryan Dunn 01/21/2020,5:17 AM

## 2020-01-21 NOTE — H&P (Signed)
Cardiology Admission History and Physical:   Patient ID: Bryan Dunn MRN: [id number]; DOB: 1931-04-11   Admission date: 01/21/2020  Primary Care Provider: System, Pcp Not In Primary Cardiologist: None (NEW) Primary Electrophysiologist:  None   Chief Complaint:  NSTEMI  Patient Profile:   Bryan Dunn is a 86 y.o. male with a hx of very remote CAD with PCI over 20 years ago in [place], HTN, HLD and neuropathy who presented to [facility] with complaints of chest pain and found to have markedly abnormal EKG and elevated hsTrop.  History of Present Illness:   [name] is a 86 y.o. male with a hx of very remote CAD with PCI over 20 years ago in [place], HTN, HLD and neuropathy who presented to [facility] with complaints of chest pain and found to have markedly abnormal EKG and elevated hsTrop.  He states that he recently had his meds changed from taking his ARB in the evening and Diltiazem in the daytime and his PCP reversed them.  For the past 3 nights he has had chest pain on the left side with radiation to his left arm that occurs nightly around 10pm but is fine during the day.  He attributed it to the change in medication.  Tonight he had the discomfort again with radiation into his left arm and told his son as they were planning to leave to go to [place].  His son brought him to the ER for evaluation.   In the ER he continues to complain of left sided chest pain with achiness in his left arm rated a 4/10 currently.  There is no SOB or diaphoresis but each time he gets this pain he develops nausea and vomiting.  In ER EKG is markedly abnormal with ST elevation of 32mm in aVR and V1 and marked diffuse ST depression throughout the rest of the EKG.  hsTrop elevated at 1825.  Creatinine elevated at 1.8 and BS 249.  HbA1C elevated at 6%.      Past Medical History:  Diagnosis Date  . Benign essential HTN   . CAD (coronary artery disease)    remote PCI > 20 years ago in [place]  . HLD (hyperlipidemia)   . Neuropathy     Past  Surgical History:  Procedure Laterality Date  . CORONARY ANGIOPLASTY WITH STENT PLACEMENT    . MEDIAL PARTIAL KNEE REPLACEMENT Bilateral      Medications Prior to Admission: Prior to Admission medications   Not on File     Allergies:   No Known Allergies  Social History:   Social History   Socioeconomic History  . Marital status: Married    Spouse name: Not on file  . Number of children: Not on file  . Years of education: Not on file  . Highest education level: Not on file  Occupational History  . Not on file  Tobacco Use  . Smoking status: Not on file  Substance and Sexual Activity  . Alcohol use: Not on file  . Drug use: Not on file  . Sexual activity: Not on file  Other Topics Concern  . Not on file  Social History Narrative  . Not on file   Social Determinants of Health   Financial Resource Strain:   . Difficulty of Paying Living Expenses:   Food Insecurity:   . Worried About Charity fundraiser in the Last Year:   . Arboriculturist in the Last Year:   Transportation Needs:   . Lack  of Transportation (Medical):   Marland Kitchen Lack of Transportation (Non-Medical):   Physical Activity:   . Days of Exercise per Week:   . Minutes of Exercise per Session:   Stress:   . Feeling of Stress :   Social Connections:   . Frequency of Communication with Friends and Family:   . Frequency of Social Gatherings with Friends and Family:   . Attends Religious Services:   . Active Member of Clubs or Organizations:   . Attends Archivist Meetings:   Marland Kitchen Marital Status:   Intimate Partner Violence:   . Fear of Current or Ex-Partner:   . Emotionally Abused:   Marland Kitchen Physically Abused:   . Sexually Abused:     Family History:   The patient's family history includes CAD in his father.    ROS:  Please see the history of present illness.  All other ROS reviewed and negative.     Physical Exam/Data:   Vitals:   01/21/20 0427 01/21/20 0432 01/21/20 0437 01/21/20 0442  BP: (!)  123/88 122/84 114/80 105/72  Pulse: 97 (!) 108 97 93  Resp: 15 19 (!) 8 20  Temp:      TempSrc:      SpO2: 94% (!) 88% 91% 91%  Weight:      Height:       No intake or output data in the 24 hours ending 01/21/20 0519 Last 3 Weights 01/21/2020  Weight (lbs) 174 lb  Weight (kg) 78.926 kg     Body mass index is 25.33 kg/m.  General:  Well nourished, well developed, in no acute distress HEENT: normal Lymph: no adenopathy Neck: no JVD Endocrine:  No thryomegaly Vascular: No carotid bruits; decrease LE pulses Cardiac:  normal S1, S2; RRR; no murmur  Lungs:  clear to auscultation bilaterally, no wheezing, rhonchi or rales  Abd: soft, nontender, no hepatomegaly  Ext: no edema Musculoskeletal:  No deformities, BUE and BLE strength normal and equal Skin: warm and dry  Neuro:  CNs 2-12 intact, no focal abnormalities noted Psych:  Normal affect    EKG:  The ECG that was done in ER was personally reviewed and demonstrates NSR with 94mm ST elevation in aVR and V1 and marked ST depression diffusely through rest of leads  Relevant CV Studies: none  Laboratory Data:  High Sensitivity Troponin:   Recent Labs  Lab 01/21/20 0325  TROPONINIHS 1,825*      Chemistry Recent Labs  Lab 01/21/20 0325  NA 137  K 4.0  CL 103  CO2 19*  GLUCOSE 249*  BUN 45*  CREATININE 1.80*  CALCIUM 9.9  GFRNONAA 33*  GFRAA 38*  ANIONGAP 15    Recent Labs  Lab 01/21/20 0325  PROT 7.0  ALBUMIN 3.8  AST 44*  ALT 27  ALKPHOS 81  BILITOT 1.2   Hematology Recent Labs  Lab 01/21/20 0325  WBC 13.8*  RBC 4.24  HGB 13.5  HCT 41.2  MCV 97.2  MCH 31.8  MCHC 32.8  RDW 14.4  PLT 328   BNPNo results for input(s): BNP, PROBNP in the last 168 hours.  DDimer No results for input(s): DDIMER in the last 168 hours.   Radiology/Studies:  DG Chest Portable 1 View  Result Date: 01/21/2020 CLINICAL DATA:  Chest pain EXAM: PORTABLE CHEST 1 VIEW COMPARISON:  None. FINDINGS: Cardiac shadow is at the  upper limits of normal in size. Increased vascular congestion with interstitial edema is noted consistent with CHF. No bony abnormality  is seen. No sizable effusion is noted. IMPRESSION: Changes of CHF. Electronically Signed   By: Inez Catalina M.D.   On: 01/21/2020 03:56       TIMI Risk Score for Unstable Angina or Non-ST Elevation MI:   The patient's TIMI risk score is 7, which indicates a 41% risk of all cause mortality, new or recurrent myocardial infarction or need for urgent revascularization in the next 14 days.   Assessment and Plan:   1. NSTEMI -presented with 3 day hx of chest pain with radiation to the left arm associated with N/v -patient thought it was related to changing his diltiazem to evening because he was fine during the day -tonight had another episode and EKG markedly ischemic in the ER with elevated hsTrop > 1000 -patient seen with Dr. Tamala Julian and plan to take emergently to cath lab due to ongoing 4/10 CP and left arm pain -suspect will have LM dz or severe 3v CAD -start IV Heparin gtt, ASA, statin -no BB or ARB at this time due to soft BP -check FLP, HbA1C -check 2D echo in am to assess LVF  2.  ASCAD -hx of remote PCI in [place] >  20 years ago -continue ASA -will get a list of home meds from his son and verify with pharmacy  3.  HTN -BP soft at present so will hold ARB and Cardizem  4.  HLD -LDL goal < 70 -check FLP and ALT -high dose statin>>atorva 80mg  daily   Severity of Illness: The appropriate patient status for this patient is INPATIENT. Inpatient status is judged to be reasonable and necessary in order to provide the required intensity of service to ensure the patient's safety. The patient's presenting symptoms, physical exam findings, and initial radiographic and laboratory data in the context of their chronic comorbidities is felt to place them at high risk for further clinical deterioration. Furthermore, it is not anticipated that the patient will be  medically stable for discharge from the hospital within 2 midnights of admission. The following factors support the patient status of inpatient.   " The patient's presenting symptoms include chest and arm pain with N/V. " The worrisome physical exam findings include nonw. " The initial radiographic and laboratory data are worrisome because of elevated hsTroponin, AKI, abnormal EKG. " The chronic co-morbidities include CAD, HTN, HLD.   * I certify that at the point of admission it is my clinical judgment that the patient will require inpatient hospital care spanning beyond 2 midnights from the point of admission due to high intensity of service, high risk for further deterioration and high frequency of surveillance required.*    For questions or updates, please contact [place] Please consult [email or web address] for contact info under        Signed, Fransico Him, MD  01/21/2020 5:19 AM

## 2020-01-21 NOTE — Consult Note (Addendum)
[address]       [place] [address]             [phone]        Bryan Dunn Medical Record #[id number] Date of Birth: 04-Mar-1931  Referring: Berry/Smith Primary Care: System, Pcp Not In Primary Cardiologist:Bryan Radford Pax, MD  Chief Complaint:    Chief Complaint  Patient presents with  . Chest Pain  . Code STEMI   History of Present Illness:      [name] is an 86 yo white male with known history of peripheral neuropathy, HTN, Hyperlipidemia, and history of CAD with PCI to his OM.  He presented to the ED with a 3 day complaint of chest pain, nausea, vomiting, and diarrhea.  He also admits that his medications were changed recently by his PCP with him taking his ARB nightly and Cardizem in the daytime.  The patient's pain had been occurring nightly with radiation to his left arm.  His son brought him to the ED for evaluation.  EKG was obtained and was markedly abnormal with ST elevation in aVR and V1.  There was diffuse ST depression throughout the rest of the EKG.  His troponin level was also elevated and he was ruled in for NSTEMI.  His creatinine level was also elevated at 1.8.  He was admitted for further care.  He underwent cardiac catheterization this morning which showed a reduced EF of 30% and multivessel CAD with LM involvement.  TCTS consult has been requested for possible coronary bypass procedure.  Currently the patient is chest pain free.  He does continue to have nausea, vomiting, and diarrhea at times.  He overall is fairly active.  He works out 200 min per week, mostly on a stationary bike or a rowing machine.  He is able to walk some, but overall is limited due to the neuropathy of his lower extremity which is worse on the left side.  He denies history of smoking.  He denies history of diabetes.  He wishes to have surgery if he is felt to be a surgical candidate.        Current Activity/ Functional Status: Patient is independent with  mobility/ambulation, transfers, ADL's, IADL's.   Zubrod Score: At the time of surgery this patient's most appropriate activity status/level should be described as: []     0    Normal activity, no symptoms []     1    Restricted in physical strenuous activity but ambulatory, able to do out light work []     2    Ambulatory and capable of self care, unable to do work activities, up and about                 more than 50%  Of the time                            []     3    Only limited self care, in bed greater than 50% of waking hours []     4    Completely disabled, no self care, confined to bed or chair []     5    Moribund  Past Medical History:  Diagnosis Date  . Benign essential HTN   . CAD (coronary artery disease)    remote PCI > 20 years ago in [place]  . HLD (hyperlipidemia)   . Neuropathy  Past Surgical History:  Procedure Laterality Date  . CORONARY ANGIOPLASTY WITH STENT PLACEMENT    . CORONARY/GRAFT ACUTE MI REVASCULARIZATION N/A 01/21/2020   Procedure: Coronary/Graft Acute MI Revascularization;  Surgeon: Bryan Crome, MD;  Location: [facility];  Service: Cardiovascular;  Laterality: N/A;  . LEFT HEART CATH AND CORONARY ANGIOGRAPHY N/A 01/21/2020   Procedure: LEFT HEART CATH AND CORONARY ANGIOGRAPHY;  Surgeon: Bryan Crome, MD;  Location: [facility];  Service: Cardiovascular;  Laterality: N/A;  . MEDIAL PARTIAL KNEE REPLACEMENT Bilateral     Social History   Tobacco Use  Smoking Status Not on file    Social History   Substance and Sexual Activity  Alcohol Use None     No Known Allergies  Current Facility-Administered Medications  Medication Dose Route Frequency Provider Last Rate Last Admin  . 0.9 %  sodium chloride infusion  250 mL Intravenous PRN Bryan Margarita, MD 10 mL/hr at 01/21/20 1021 IV [employer] at 01/21/20 1021  . acetaminophen (TYLENOL) tablet 650 mg  650 mg Oral Q4H PRN Bryan Margarita, MD      . aspirin chewable tablet 81 mg   81 mg Oral Daily Bryan Dunn, Bryan R, MD      . atorvastatin (LIPITOR) tablet 80 mg  80 mg Oral Daily Bryan Dunn, Bryan R, MD      . chlorhexidine (PERIDEX) 0.12 % solution 15 mL  15 mL Mouth Rinse BID Bryan Crome, MD      . heparin ADULT infusion 100 units/mL (25000 units/226mL sodium chloride 0.45%)  1,100 Units/hr Intravenous Continuous Bryan Dunn, RPH 11 mL/hr at 01/21/20 1017 1,100 Units/hr at 01/21/20 1017  . MEDLINE mouth rinse  15 mL Mouth Rinse q12n4p Bryan Crome, MD      . melatonin tablet 3 mg  3 mg Oral QHS Bryan Crome, MD   3 mg at 01/21/20 1093  . multivitamin (PROSIGHT) tablet 1 tablet  1 tablet Oral BID Bryan Harp, MD      . nitroGLYCERIN (NITROSTAT) SL tablet 0.4 mg  0.4 mg Sublingual Q5 Min x 3 PRN Bryan Him R, MD      . nitroGLYCERIN 50 mg in dextrose 5 % 250 mL (0.2 mg/mL) infusion  5 mcg/min Intravenous Titrated Bryan Him R, MD 1.5 mL/hr at 01/21/20 1000 5 mcg/min at 01/21/20 1000  . ondansetron (ZOFRAN) 4 MG/2ML injection           . ondansetron (ZOFRAN) injection 4 mg  4 mg Intravenous Q6H PRN Bryan Margarita, MD   4 mg at 01/21/20 0616  . sodium chloride flush (NS) 0.9 % injection 3 mL  3 mL Intravenous Once Bryan Dunn, Bryan Olds, MD      . sodium chloride flush (NS) 0.9 % injection 3 mL  3 mL Intravenous Q12H Bryan Margarita, MD   3 mL at 01/21/20 1109  . sodium chloride flush (NS) 0.9 % injection 3 mL  3 mL Intravenous PRN Bryan Margarita, MD        Medications Prior to Admission  Medication Sig Dispense Refill Last Dose  . acetaminophen (TYLENOL) 500 MG tablet Take 1,000 mg by mouth every 6 (six) hours as needed for mild pain.   unk  . atorvastatin (LIPITOR) 40 MG tablet Take 40 mg by mouth daily.   01/20/2020 at Unknown time  . diltiazem (DILACOR XR) 120 MG 24 hr capsule Take 120 mg by mouth daily.   01/20/2020 at  Unknown time  . diphenhydramine-acetaminophen (TYLENOL PM) 25-500 MG TABS tablet Take 1 tablet by mouth at bedtime as needed (sleep).    01/20/2020 at Unknown time  . DULoxetine (CYMBALTA) 20 MG capsule Take 20 mg by mouth daily as needed (neuropathy).    01/19/2020  . hydrochlorothiazide (HYDRODIURIL) 25 MG tablet Take 25 mg by mouth daily.   01/20/2020 at Unknown time  . irbesartan (AVAPRO) 150 MG tablet Take 150 mg by mouth daily.   01/20/2020 at Unknown time  . Multiple Vitamin (MULTIVITAMIN) capsule Take 1 capsule by mouth daily.   01/20/2020 at Unknown time  . Multiple Vitamins-Minerals (PRESERVISION AREDS 2 PO) Take 1 tablet by mouth in the morning and at bedtime.   01/20/2020 at Unknown time    Family History  Problem Relation Age of Onset  . CAD Father    Review of Systems:   ROS    Cardiac Review of Systems: Y or  [    ]= no  Chest Pain [ Y, resolved   ]  Resting SOB [   ] Exertional SOB  [  ]  Orthopnea [  ]   Pedal Edema [   ]    Palpitations [  ] Syncope  [  ]   Presyncope [   ]  General Review of Systems: [Y] = yes [  ]=no Constitional: recent weight change [  ]; anorexia [  ]; fatigue [  ]; nausea [Y  ]; night sweats [  ]; fever [  ]; or chills [  ]                                                               Dental: Last Dentist visit:   Eye : blurred vision [  ]; diplopia [   ]; vision changes [  ];  Amaurosis fugax[  ]; Resp: cough [ N ];  wheezing[  ];  hemoptysis[  ]; shortness of breath[  ]; paroxysmal nocturnal dyspnea[ N ]; dyspnea on exertion[N  ]; or orthopnea[  ];  GI:  gallstones[  ], vomiting[Y  ];  dysphagia[  ]; melena[  ];  hematochezia [  ]; heartburn[  ];   Hx of  Colonoscopy[  ]; GU: kidney stones [  ]; hematuria[  ];   dysuria [  ];  nocturia[  ];  history of     obstruction [  ]; urinary frequency [  ]             Skin: rash, swelling[  ];, hair loss[  ];  peripheral edema[  ];  or itching[  ]; Musculosketetal: myalgias[  ];  joint swelling[  ];  joint erythema[  ];  joint pain[  ];  back pain[  ];  Heme/Lymph: bruising[  ];  bleeding[  ];  anemia[  ];  Neuro: TIA[  ];  headaches[  ];   stroke[  ];  vertigo[  ];  seizures[  ];   paresthesias[  ];  difficulty walking[  ];  Psych:depression[  ]; anxiety[  ];  Endocrine: diabetes[N  ];  thyroid dysfunction[N  ];  Physical Exam: BP 113/78   Pulse 97   Temp 98.2 F (36.8 C) (Oral)   Resp (!) 25   Ht 5' 9.5" (  1.765 m)   Wt 81.3 kg   SpO2 91%   BMI 26.09 kg/m   General appearance: alert, cooperative and no distress Head: Normocephalic, without obvious abnormality, atraumatic Neck: no adenopathy, no carotid bruit, no JVD, supple, symmetrical, trachea midline and thyroid not enlarged, symmetric, no tenderness/mass/nodules Resp: clear to auscultation bilaterally Cardio: regular rate and rhythm GI: soft, non-tender; bowel sounds normal; no masses,  no organomegaly Extremities: varicose veins noted Neurologic: Grossly normal  Diagnostic Studies & Laboratory data:     Recent Radiology Findings:   CARDIAC CATHETERIZATION  Result Date: 01/21/2020  Heavily calcified severe three-vessel coronary artery disease.  Decreased LVEF, 35 to 40% with anteroapical moderate hypokinesis.  LVEDP 22%.  Acute on chronic combined systolic and diastolic heart failure.  30% proximal left main  Heavily calcified 95% proximal LAD followed by total occlusion in the mid vessel.  99% ostial to proximal circumflex proximal to a previously placed stent which is patent.  2 large obtuse marginal branches are noted distally.  RCA is dominant.  There is eccentric 85% possible acute lesion in the RCA.  Segmental 75 to 90% distal RCA with severe diffuse disease in the PDA and LV branches with left to right collaterals.  Large acute marginal branch supplies collaterals around the apex to the LAD. RECOMMENDATIONS:  Guarded prognosis given age, LV function, and multifocal high-grade three-vessel CAD.  Needs to be evaluated to determine if he is a surgical candidate.  IV nitroglycerin  Resume IV heparin  Cycle cardiac markers   DG Chest Portable 1  View  Result Date: 01/21/2020 CLINICAL DATA:  Chest pain EXAM: PORTABLE CHEST 1 VIEW COMPARISON:  None. FINDINGS: Cardiac shadow is at the upper limits of normal in size. Increased vascular congestion with interstitial edema is noted consistent with CHF. No bony abnormality is seen. No sizable effusion is noted. IMPRESSION: Changes of CHF. Electronically Signed   By: Bryan Dunn M.D.   On: 01/21/2020 03:56     I have independently reviewed the above radiologic studies and discussed with the patient   Recent Lab Findings: Lab Results  Component Value Date   WBC 13.8 (H) 01/21/2020   HGB 13.5 01/21/2020   HCT 41.2 01/21/2020   PLT 328 01/21/2020   GLUCOSE 249 (H) 01/21/2020   CHOL 181 01/21/2020   TRIG 63 01/21/2020   HDL 61 01/21/2020   LDLCALC 107 (H) 01/21/2020   ALT 27 01/21/2020   AST 44 (H) 01/21/2020   NA 137 01/21/2020   K 4.0 01/21/2020   CL 103 01/21/2020   CREATININE 1.80 (H) 01/21/2020   BUN 45 (H) 01/21/2020   CO2 19 (L) 01/21/2020   INR 1.0 01/21/2020   HGBA1C 6.0 (H) 01/21/2020    Assessment / Plan:      1. CAD, H/O PCI approximately 20 years with patent stent, presented with NSTEMI with 3 vessel disease, reduced EF 30% and LM involvement.   Currently chest pain free, N/V persists 2. H/O Peripheral neuropathy- limits ambulation, but is active working out several days per week 3. H/O HTN 4. H/O Hyperlipidemia 5. Elevated creatinine, on admission was 1.8, currently on IV fluids, repeat labs ordered for tomorrow 6. Dispo- patient stable, would like to proceed with surgery if a candidate.. he denies chest pain, but N/V persists.  Dr. Kipp Dunn will follow up with further recommendations.  I  spent 55 minutes counseling the patient face to face.   Bryan Barrett, PA-C 01/21/2020 11:41 AM  Agree with above. This  is an 86 year old gentleman that presents with an NSTEMI and three-vessel coronary disease.  He does have a history of coronary artery disease and has a  patent stent, but has heavily calcified three-vessel disease.  He does have good distal targets on review of his left heart cath.  His ejection fraction is slightly down, but I am awaiting the echocardiogram for formal review of his right and left heart function.  Additionally will need further evaluation for any valvular disease.  He appears quite functional, thus likely would recover well from open heart surgery.  I would like for further evaluation of his kidney function.  On review of his labs he appears hypovolemic, but I explained to him that after contrast load he may develop a further kidney injury.  He is agreeable with following up on this tomorrow.  I will have a conversation with his family members now for further discussion of operative timing and recovery.  He is tentatively scheduled for 7/28 for CABG x4.  Bryan Dunn

## 2020-01-21 NOTE — CV Procedure (Signed)
   Presentation with nausea, vomiting, diarrhea, and shoulder discomfort.  EKG performed demonstrated widespread ST segment depression (deep) and aVR ST elevation.  CKD 4 with creatinine 1.8.  Contrast used 45 cc.  All 3 coronaries are heavily calcified.  90%, possibly thrombus containing proximal RCA.  Severe diffuse disease distally after the acute marginal branch with occlusion of the PDA and 99% stenosis of the distal vessel before the origin of the PDA and LV branches.  RCA fills distal LAD by apical collaterals.  LAD contains ostial to proximal eccentric 99% stenosis and is totally occluded after the second diagonal.  Ostial 95% circumflex stenosis within a heavily calcified segment.  2 large obtuse marginal branches arise distally.  The previously placed proximal circumflex stent is patent.   Left main contains ostial 30% narrowing.  Inferior wall hypokinesis with anterior and apical more significant hypokinesis.  Estimated ejection fraction 35%.  LVEDP 22 mmHg.  RECOMMENDATIONS: IV heparin, IV nitroglycerin, TCTS consultation to determine if the patient is a surgical revascularization candidate.  Anatomy is not amenable to PCI.  The proximal RCA could be stented but in the grand scheme of things not certain this would be very helpful.

## 2020-01-21 NOTE — ED Triage Notes (Signed)
Pt reports a change in his mediation a week ago.  Pt has been experiencing emesis for the past few days, has loose stools tonight.

## 2020-01-21 NOTE — Progress Notes (Signed)
ANTICOAGULATION CONSULT NOTE - Initial Consult  Pharmacy Consult for heparin Indication: chest pain/ACS  No Known Allergies  Patient Measurements: Height: 5' 9.5" (176.5 cm) Weight: 81.3 kg (179 lb 3.7 oz) IBW/kg (Calculated) : 71.85 Heparin Dosing Weight: 78.9 kg  Vital Signs: Temp: 98.6 F (37 C) (07/26 1500) Temp Source: Oral (07/26 1500) BP: 102/71 (07/26 1900) Pulse Rate: 94 (07/26 1900)  Labs: Recent Labs    01/21/20 0325 01/21/20 0702 01/21/20 0851 01/21/20 1829  HGB 13.5  --   --   --   HCT 41.2  --   --   --   PLT 328  --   --   --   APTT 33  --   --   --   LABPROT 12.4  --   --   --   INR 1.0  --   --   --   HEPARINUNFRC  --   --   --  0.20*  CREATININE 1.80*  --   --   --   TROPONINIHS 1,825* 4,392* 5,974*  --     Estimated Creatinine Clearance: 29.4 mL/min (A) (by C-G formula based on SCr of 1.8 mg/dL (H)).  Assessment: 86 yo m on heparin for ACS - planned CABG 7/28  Initial hep lvl low at 0.2  Goal of Therapy:  Heparin level 0.3-0.7 units/ml Monitor platelets by anticoagulation protocol: Yes   Plan:  Bolus heparin 1000 units x 1 Increase drip to 1250 units/hr Recheck lvls with am labs  Barth Kirks, PharmD, BCPS, BCCCP Clinical Pharmacist [phone]  Please check AMION for all [place] numbers  01/21/2020 7:21 PM

## 2020-01-21 NOTE — Progress Notes (Signed)
TCTS consulted for CABG evaluation. °

## 2020-01-22 ENCOUNTER — Encounter (HOSPITAL_COMMUNITY): Payer: Self-pay | Admitting: Certified Registered"

## 2020-01-22 ENCOUNTER — Inpatient Hospital Stay (HOSPITAL_COMMUNITY): Payer: Medicare Other

## 2020-01-22 DIAGNOSIS — I214 Non-ST elevation (NSTEMI) myocardial infarction: Secondary | ICD-10-CM | POA: Diagnosis not present

## 2020-01-22 DIAGNOSIS — I255 Ischemic cardiomyopathy: Secondary | ICD-10-CM

## 2020-01-22 DIAGNOSIS — I25110 Atherosclerotic heart disease of native coronary artery with unstable angina pectoris: Secondary | ICD-10-CM | POA: Diagnosis not present

## 2020-01-22 DIAGNOSIS — Z01810 Encounter for preprocedural cardiovascular examination: Secondary | ICD-10-CM

## 2020-01-22 LAB — GLUCOSE, CAPILLARY
Glucose-Capillary: 168 mg/dL — ABNORMAL HIGH (ref 70–99)
Glucose-Capillary: 212 mg/dL — ABNORMAL HIGH (ref 70–99)
Glucose-Capillary: 215 mg/dL — ABNORMAL HIGH (ref 70–99)
Glucose-Capillary: 179 mg/dL — ABNORMAL HIGH (ref 70–99)

## 2020-01-22 LAB — SODIUM, URINE, RANDOM: Sodium, Ur: 36 mmol/L

## 2020-01-22 LAB — CBC
HCT: 39.5 % (ref 39.0–52.0)
Hemoglobin: 13.3 g/dL (ref 13.0–17.0)
MCH: 32.6 pg (ref 26.0–34.0)
MCHC: 33.7 g/dL (ref 30.0–36.0)
MCV: 96.8 fL (ref 80.0–100.0)
Platelets: 233 10*3/uL (ref 150–400)
RBC: 4.08 MIL/uL — ABNORMAL LOW (ref 4.22–5.81)
RDW: 14.4 % (ref 11.5–15.5)
WBC: 13.4 10*3/uL — ABNORMAL HIGH (ref 4.0–10.5)
nRBC: 0 % (ref 0.0–0.2)

## 2020-01-22 LAB — LIPID PANEL
Cholesterol: 192 mg/dL (ref 0–200)
HDL: 68 mg/dL (ref >40)
LDL Cholesterol: 112 mg/dL — ABNORMAL HIGH (ref 0–99)
Total CHOL/HDL Ratio: 2.8 RATIO
Triglycerides: 61 mg/dL (ref <150)
VLDL: 12 mg/dL (ref 0–40)

## 2020-01-22 LAB — HEPARIN LEVEL (UNFRACTIONATED)
Heparin Unfractionated: 0.54 IU/mL (ref 0.30–0.70)
Heparin Unfractionated: 0.45 IU/mL (ref 0.30–0.70)

## 2020-01-22 LAB — COMPREHENSIVE METABOLIC PANEL
ALT: 31 U/L (ref 0–44)
AST: 105 U/L — ABNORMAL HIGH (ref 15–41)
Albumin: 3.3 g/dL — ABNORMAL LOW (ref 3.5–5.0)
Alkaline Phosphatase: 70 U/L (ref 38–126)
Anion gap: 13 (ref 5–15)
BUN: 43 mg/dL — ABNORMAL HIGH (ref 8–23)
CO2: 19 mmol/L — ABNORMAL LOW (ref 22–32)
Calcium: 9.1 mg/dL (ref 8.9–10.3)
Chloride: 105 mmol/L (ref 98–111)
Creatinine, Ser: 1.37 mg/dL — ABNORMAL HIGH (ref 0.61–1.24)
GFR calc Af Amer: 53 mL/min — ABNORMAL LOW (ref >60)
GFR calc non Af Amer: 46 mL/min — ABNORMAL LOW (ref >60)
Glucose, Bld: 179 mg/dL — ABNORMAL HIGH (ref 70–99)
Potassium: 4 mmol/L (ref 3.5–5.1)
Sodium: 137 mmol/L (ref 135–145)
Total Bilirubin: 1.4 mg/dL — ABNORMAL HIGH (ref 0.3–1.2)
Total Protein: 6.3 g/dL — ABNORMAL LOW (ref 6.5–8.1)

## 2020-01-22 LAB — ABO/RH: ABO/RH(D): A POS

## 2020-01-22 MED ORDER — TRANEXAMIC ACID 1000 MG/10ML IV SOLN
1.5000 mg/kg/h | INTRAVENOUS | Status: AC
  Administered 2020-01-23: 1.5 mg/kg/h via INTRAVENOUS
  Filled 2020-01-22: qty 25

## 2020-01-22 MED ORDER — TRANEXAMIC ACID (OHS) BOLUS VIA INFUSION
15.0000 mg/kg | INTRAVENOUS | Status: AC
  Administered 2020-01-23: 1234.5 mg via INTRAVENOUS
  Filled 2020-01-22: qty 1235

## 2020-01-22 MED ORDER — FUROSEMIDE 10 MG/ML IJ SOLN
40.0000 mg | Freq: Once | INTRAMUSCULAR | Status: AC
  Administered 2020-01-22: 40 mg via INTRAVENOUS
  Filled 2020-01-22: qty 4

## 2020-01-22 MED ORDER — SODIUM CHLORIDE 0.9 % IV SOLN
750.0000 mg | INTRAVENOUS | Status: AC
  Administered 2020-01-23: 750 mg via INTRAVENOUS
  Filled 2020-01-22: qty 750

## 2020-01-22 MED ORDER — CHLORHEXIDINE GLUCONATE CLOTH 2 % EX PADS
6.0000 | MEDICATED_PAD | Freq: Once | CUTANEOUS | Status: AC
  Administered 2020-01-22: 6 via TOPICAL

## 2020-01-22 MED ORDER — PLASMA-LYTE 148 IV SOLN
INTRAVENOUS | Status: DC
  Filled 2020-01-22: qty 2.5

## 2020-01-22 MED ORDER — VANCOMYCIN HCL 1500 MG/300ML IV SOLN
1500.0000 mg | INTRAVENOUS | Status: AC
  Administered 2020-01-23: 1500 mg via INTRAVENOUS
  Filled 2020-01-22: qty 300

## 2020-01-22 MED ORDER — POTASSIUM CHLORIDE 2 MEQ/ML IV SOLN
80.0000 meq | INTRAVENOUS | Status: DC
  Filled 2020-01-22: qty 40

## 2020-01-22 MED ORDER — INSULIN REGULAR(HUMAN) IN NACL 100-0.9 UT/100ML-% IV SOLN
INTRAVENOUS | Status: AC
  Administered 2020-01-23: 2.6 [IU]/h via INTRAVENOUS
  Filled 2020-01-22: qty 100

## 2020-01-22 MED ORDER — NITROGLYCERIN IN D5W 200-5 MCG/ML-% IV SOLN
2.0000 ug/min | INTRAVENOUS | Status: DC
  Filled 2020-01-22: qty 250

## 2020-01-22 MED ORDER — LIP MEDEX EX OINT
TOPICAL_OINTMENT | CUTANEOUS | Status: DC | PRN
  Filled 2020-01-22: qty 7

## 2020-01-22 MED ORDER — DEXMEDETOMIDINE HCL IN NACL 400 MCG/100ML IV SOLN
0.1000 ug/kg/h | INTRAVENOUS | Status: AC
  Administered 2020-01-23: .5 ug/kg/h via INTRAVENOUS
  Filled 2020-01-22: qty 100

## 2020-01-22 MED ORDER — BISACODYL 5 MG PO TBEC
5.0000 mg | DELAYED_RELEASE_TABLET | Freq: Once | ORAL | Status: AC
  Administered 2020-01-22: 5 mg via ORAL
  Filled 2020-01-22: qty 1

## 2020-01-22 MED ORDER — CHLORHEXIDINE GLUCONATE 0.12 % MT SOLN
15.0000 mL | Freq: Once | OROMUCOSAL | Status: AC
  Administered 2020-01-23: 15 mL via OROMUCOSAL
  Filled 2020-01-22: qty 15

## 2020-01-22 MED ORDER — INSULIN ASPART 100 UNIT/ML ~~LOC~~ SOLN
1.0000 [IU] | Freq: Three times a day (TID) | SUBCUTANEOUS | Status: DC
  Administered 2020-01-22 (×2): 3 [IU] via SUBCUTANEOUS

## 2020-01-22 MED ORDER — TRANEXAMIC ACID (OHS) PUMP PRIME SOLUTION
2.0000 mg/kg | INTRAVENOUS | Status: DC
  Filled 2020-01-22: qty 1.65

## 2020-01-22 MED ORDER — EPINEPHRINE HCL 5 MG/250ML IV SOLN IN NS
0.0000 ug/min | INTRAVENOUS | Status: AC
  Administered 2020-01-23: 12:00:00 5 ug/min via INTRAVENOUS
  Filled 2020-01-22: qty 250

## 2020-01-22 MED ORDER — MANNITOL 20 % IV SOLN
INTRAVENOUS | Status: DC
  Filled 2020-01-22: qty 13

## 2020-01-22 MED ORDER — SODIUM CHLORIDE 0.9 % IV SOLN
INTRAVENOUS | Status: DC
  Filled 2020-01-22: qty 30

## 2020-01-22 MED ORDER — NOREPINEPHRINE 4 MG/250ML-% IV SOLN
0.0000 ug/min | INTRAVENOUS | Status: AC
  Administered 2020-01-23: 2 ug/min via INTRAVENOUS
  Filled 2020-01-22: qty 250

## 2020-01-22 MED ORDER — TEMAZEPAM 15 MG PO CAPS
15.0000 mg | ORAL_CAPSULE | Freq: Once | ORAL | Status: AC | PRN
  Administered 2020-01-22: 15 mg via ORAL
  Filled 2020-01-22: qty 1

## 2020-01-22 MED ORDER — MILRINONE LACTATE IN DEXTROSE 20-5 MG/100ML-% IV SOLN
0.3000 ug/kg/min | INTRAVENOUS | Status: DC
  Filled 2020-01-22: qty 100

## 2020-01-22 MED ORDER — METOPROLOL TARTRATE 12.5 MG HALF TABLET
12.5000 mg | ORAL_TABLET | Freq: Once | ORAL | Status: AC
  Administered 2020-01-23: 12.5 mg via ORAL
  Filled 2020-01-22: qty 1

## 2020-01-22 MED ORDER — PHENYLEPHRINE HCL-NACL 20-0.9 MG/250ML-% IV SOLN
30.0000 ug/min | INTRAVENOUS | Status: AC
  Administered 2020-01-23: 25 ug/min via INTRAVENOUS
  Filled 2020-01-22: qty 250

## 2020-01-22 MED ORDER — SODIUM CHLORIDE 0.9 % IV SOLN
1.5000 g | INTRAVENOUS | Status: AC
  Administered 2020-01-23: 1.5 g via INTRAVENOUS
  Filled 2020-01-22: qty 1.5

## 2020-01-22 MED FILL — Heparin Sod (Porcine)-NaCl IV Soln 1000 Unit/500ML-0.9%: INTRAVENOUS | Qty: 500 | Status: AC

## 2020-01-22 MED FILL — Nitroglycerin IV Soln 100 MCG/ML in D5W: INTRA_ARTERIAL | Qty: 10 | Status: AC

## 2020-01-22 NOTE — Progress Notes (Signed)
Chaplain engaged in initial visit with Bryan Dunn.  Bryan Dunn shared with chaplain that he's having a procedure tomorrow and wanted prayer.  Chaplain and Bryan Dunn prayed together.  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    Chaplain will follow-up.

## 2020-01-22 NOTE — Progress Notes (Signed)
Discussed sternal precautions, IS (400 mL), mobility post op, and d/c planning with pt and two sons. Receptive, gave them materials.  [place], ACSM 2:21 PM 01/22/2020

## 2020-01-22 NOTE — Progress Notes (Signed)
[facility] for heparin Indication: chest pain/ACS  Labs: Recent Labs    01/21/20 0325 01/21/20 0702 01/21/20 0851 01/21/20 1829 01/22/20 0136  HGB 13.5  --   --   --  13.3  HCT 41.2  --   --   --  39.5  PLT 328  --   --   --  233  APTT 33  --   --   --   --   LABPROT 12.4  --   --   --   --   INR 1.0  --   --   --   --   HEPARINUNFRC  --   --   --  0.20* 0.54  CREATININE 1.80*  --   --   --  1.37*  TROPONINIHS 1,825* 4,392* 5,974*  --   --    Assessment: 86 yo m on heparin for ACS - planned CABG 7/28 Heparin level this am 0.54 units/ml  Goal of Therapy:  Heparin level 0.3-0.7 units/ml Monitor platelets by anticoagulation protocol: Yes   Plan:  Continue heparin drip at 1250 units/hr Recheck level in 6 hours to confirm  Thanks for allowing pharmacy to be a part of this patient's care.  Excell Seltzer, PharmD Clinical Pharmacist 01/22/2020 2:50 AM

## 2020-01-22 NOTE — Progress Notes (Signed)
Pre cabg has been completed.   Preliminary results in CV Proc.   Abram Sander 01/22/2020 11:41 AM

## 2020-01-22 NOTE — Progress Notes (Addendum)
Creatinine decreased to 1.37 Echo showed LVEF 30-35%, regional wall motion abnormalities, mild to moderate MR, mild AI Patient with some shortness of breath but better than prior to admission. He denies chest pain. Dr. Kipp Brood to discuss treatment options   Agree with above Back on nitro gtt for chest pain. Reduced LV function, no significant valvular disease.  No significant aortic calcifications on CT, but pt does have bilateral pleural effusions.  Awaiting carotid duplex.    Creatinine down  OR tomorrow for CABG 4.  Christin Mccreedy Bary Leriche

## 2020-01-22 NOTE — Anesthesia Preprocedure Evaluation (Addendum)
Anesthesia Evaluation  Patient identified by MRN, date of birth, ID band Patient awake    Reviewed: Allergy & Precautions, NPO status , Patient's Chart, lab work & pertinent test results, reviewed documented beta blocker date and time   Airway Mallampati: II  TM Distance: >3 FB Neck ROM: Full    Dental  (+) Teeth Intact, Dental Advisory Given, Poor Dentition   Pulmonary shortness of breath,    Pulmonary exam normal breath sounds clear to auscultation       Cardiovascular hypertension, Pt. on medications + angina + CAD and + Past MI  Normal cardiovascular exam Rhythm:Regular Rate:Normal  01/21/20 Echo: 1. Left ventricular ejection fraction, by estimation, is 30 to 35%. The  left ventricle has moderately decreased function. The left ventricle  demonstrates regional wall motion abnormalities (see scoring  diagram/findings for description). Left ventricular  diastolic parameters are consistent with Grade I diastolic dysfunction  (impaired relaxation).  2. Right ventricular systolic function is normal. The right ventricular  size is normal. There is mildly elevated pulmonary artery systolic  pressure. The estimated right ventricular systolic pressure is 14.7 mmHg.  3. Left atrial size was moderately dilated.  4. The mitral valve is degenerative. Mild to moderate mitral valve  regurgitation. No evidence of mitral stenosis.  5. The aortic valve was not well visualized. Aortic valve regurgitation  is mild. Mild aortic valve sclerosis is present, with no evidence of  aortic valve stenosis. Aortic regurgitation PHT measures 454 msec. Aortic  valve mean gradient measures 5.4 mmHg.  Aortic valve Vmax measures 1.49 m/s.  6. The inferior vena cava is normal in size with <50% respiratory  variability, suggesting right atrial pressure of 8 mmHg.   Conclusion(s)/Recommendation(s): Findings consistent with ischemic  cardiomyopathy.     Neuro/Psych negative neurological ROS  negative psych ROS   GI/Hepatic negative GI ROS, Neg liver ROS,   Endo/Other  negative endocrine ROS  Renal/GU negative Renal ROS     Musculoskeletal negative musculoskeletal ROS (+)   Abdominal   Peds  Hematology negative hematology ROS (+)   Anesthesia Other Findings   Reproductive/Obstetrics                           Anesthesia Physical Anesthesia Plan  ASA: IV  Anesthesia Plan: General   Post-op Pain Management:    Induction: Intravenous  PONV Risk Score and Plan: 2 and Treatment may vary due to age or medical condition  Airway Management Planned: Oral ETT  Additional Equipment: Arterial line, CVP, PA Cath, TEE and Ultrasound Guidance Line Placement  Intra-op Plan:   Post-operative Plan: Post-operative intubation/ventilation  Informed Consent: I have reviewed the patients History and Physical, chart, labs and discussed the procedure including the risks, benefits and alternatives for the proposed anesthesia with the patient or authorized representative who has indicated his/her understanding and acceptance.     Dental advisory given  Plan Discussed with: CRNA  Anesthesia Plan Comments:        Anesthesia Quick Evaluation

## 2020-01-22 NOTE — Progress Notes (Signed)
[facility] for heparin Indication: chest pain/ACS  Labs: Recent Labs    01/21/20 0325 01/21/20 0702 01/21/20 0851 01/21/20 1829 01/22/20 0136 01/22/20 0933  HGB 13.5  --   --   --  13.3  --   HCT 41.2  --   --   --  39.5  --   PLT 328  --   --   --  233  --   APTT 33  --   --   --   --   --   LABPROT 12.4  --   --   --   --   --   INR 1.0  --   --   --   --   --   HEPARINUNFRC  --   --   --  0.20* 0.54 0.45  CREATININE 1.80*  --   --   --  1.37*  --   TROPONINIHS 1,825* 4,392* 5,974*  --   --   --    Assessment: 86 yo m on heparin for ACS - planned CABG 7/28 Heparin level this am 0.45 units/ml on recheck.   Goal of Therapy:  Heparin level 0.3-0.7 units/ml Monitor platelets by anticoagulation protocol: Yes   Plan:  Continue heparin drip at 1250 units/hr  Thanks for allowing pharmacy to be a part of this patient's care.  Erin Hearing PharmD., BCPS Clinical Pharmacist 01/22/2020 11:06 AM

## 2020-01-22 NOTE — Plan of Care (Signed)
Briefly, 86 yo M with OM disease unable for revascularization.  Planned for CABG 01/23/20.  Called at 530 AM, as patient was unsettled.  Patient notes he cannot catch his breath or lie flat.  Increase in O2 requirement from 6 L to 10 L.   Lasix Naive Patient: Will trial 40 IV lasix with follow up urinary sodium.   Rudean Haskell MD

## 2020-01-22 NOTE — Progress Notes (Signed)
°   01/22/20 1500  Clinical Encounter Type  Visited With Patient and family together  Visit Type Initial;Other (Comment) (Advance Directive)  Referral From Nurse  Consult/Referral To Chaplain  This chaplain was paged by RN-Jessica to facilitate notarization of Pt. Advance Directive.  The Pt. two sons are bedside.  The Pt. stated he has no questions about the document. The document named Bryan Dunn primary HCPOA and Bryan Dunn as a second choice for HCPOA. The Pt. completed a Living Will.  Both documents were notarized. The original and two copies were given to the Pt.(folder) per David's instructions.  The third copy was put in the Pt. chart.  The chaplain understands the Pt. has another Forensic scientist in [place]. The Chaplain understands per conversation with the Spiritual Care Director, the [place] Advance Directive has authority in the state of [place].  F/U spiritual care is available as needed.

## 2020-01-22 NOTE — Progress Notes (Signed)
Progress Note  Patient Name: Bryan Dunn Date of Encounter: 01/22/2020  [facility] Cardiologist: Fransico Him, MD   Subjective   Mr. Schack is feeling better this morning.  He is on IV heparin and nitroglycerin.  He denies chest pain.  His shortness of breath improved with IV Lasix diuresing 300 cc of urine.  He apparently still on track for CABG tomorrow with Dr. Kipp Brood  Inpatient Medications    Scheduled Meds:  aspirin  81 mg Oral Daily   atorvastatin  80 mg Oral Daily   chlorhexidine  15 mL Mouth Rinse BID   Chlorhexidine Gluconate Cloth  6 each Topical Daily   mouth rinse  15 mL Mouth Rinse q12n4p   melatonin  3 mg Oral QHS   multivitamin  1 tablet Oral BID   sodium chloride flush  3 mL Intravenous Once   sodium chloride flush  3 mL Intravenous Q12H   Continuous Infusions:  sodium chloride 10 mL/hr at 01/21/20 2300   heparin 1,250 Units/hr (01/21/20 2300)   nitroGLYCERIN Stopped (01/21/20 1334)   PRN Meds: sodium chloride, acetaminophen, nitroGLYCERIN, ondansetron (ZOFRAN) IV, prochlorperazine, sodium chloride flush   Vital Signs    Vitals:   01/22/20 0500 01/22/20 0600 01/22/20 0650 01/22/20 0700  BP: 110/67 113/77  117/83  Pulse: 99 104  101  Resp: 17 12  (!) 25  Temp:   97.6 F (36.4 C)   TempSrc:   Oral   SpO2: 94% 96%  (!) 88%  Weight: 82.3 kg     Height:        Intake/Output Summary (Last 24 hours) at 01/22/2020 0824 Last data filed at 01/22/2020 0745 Gross per 24 hour  Intake 920.06 ml  Output 534 ml  Net 386.06 ml   Last 3 Weights 01/22/2020 01/21/2020 01/21/2020  Weight (lbs) 181 lb 7 oz 179 lb 3.7 oz 174 lb  Weight (kg) 82.3 kg 81.3 kg 78.926 kg      Telemetry    Sinus rhythm- Personally Reviewed  ECG    Sinus tachycardia 108 with ST segment elevation in lead aVR in lead III and diffuse ST segment depression otherwise.- Personally Reviewed  Physical Exam   GEN: No acute distress.   Neck: No JVD Cardiac: RRR, no  murmurs, rubs, or gallops.  Respiratory: Clear to auscultation bilaterally. GI: Soft, nontender, non-distended  MS: No edema; No deformity. Neuro:  Nonfocal  Psych: Normal affect  Extremities: Right radial puncture site stable  Labs    High Sensitivity Troponin:   Recent Labs  Lab 01/21/20 0325 01/21/20 0702 01/21/20 0851  TROPONINIHS 1,825* 4,392* 5,974*      Chemistry Recent Labs  Lab 01/21/20 0325 01/22/20 0136  NA 137 137  K 4.0 4.0  CL 103 105  CO2 19* 19*  GLUCOSE 249* 179*  BUN 45* 43*  CREATININE 1.80* 1.37*  CALCIUM 9.9 9.1  PROT 7.0 6.3*  ALBUMIN 3.8 3.3*  AST 44* 105*  ALT 27 31  ALKPHOS 81 70  BILITOT 1.2 1.4*  GFRNONAA 33* 46*  GFRAA 38* 53*  ANIONGAP 15 13     Hematology Recent Labs  Lab 01/21/20 0325 01/22/20 0136  WBC 13.8* 13.4*  RBC 4.24 4.08*  HGB 13.5 13.3  HCT 41.2 39.5  MCV 97.2 96.8  MCH 31.8 32.6  MCHC 32.8 33.7  RDW 14.4 14.4  PLT 328 233    BNP Recent Labs  Lab 01/21/20 0702  BNP 393.8*     DDimer No results  for input(s): DDIMER in the last 168 hours.   Radiology    CT CHEST WO CONTRAST  Result Date: 01/22/2020 CLINICAL DATA:  History of recent STEMI and chest pain, evaluate for aortic disease EXAM: CT CHEST WITHOUT CONTRAST TECHNIQUE: Multidetector CT imaging of the chest was performed following the standard protocol without IV contrast. COMPARISON:  Chest x-ray from the previous day. FINDINGS: Cardiovascular: Thoracic aorta demonstrates atherosclerotic calcifications of a mild degree. Mild dilatation of the ascending aorta to 4.3 cm is noted. Heavy coronary calcifications are noted consistent with the given clinical history. No cardiac enlargement is noted. Pulmonary artery as visualized appears within normal limits. Mediastinum/Nodes: Thoracic inlet is unremarkable. No sizable hilar or mediastinal adenopathy is noted. The esophagus is within normal limits. Lungs/Pleura: Lungs are well aerated bilaterally. Bilateral  pleural effusions are seen with mild lower lobe consolidation identified. Interstitial thickening is noted as well as patchy central airspace opacity most consistent with congestive failure. These changes are slightly greater on the right than the left. They appear to have progressed somewhat in the interval from the prior plain film examination. No pneumothorax is seen. Upper Abdomen: Calcification in the gallbladder wall is noted. No other focal abnormality in the upper abdomen is seen. Musculoskeletal: Degenerative changes of the thoracic spine are noted. IMPRESSION: Changes of congestive failure with bilateral pleural effusions increased from the prior plain film examination. Lower lobe consolidation is noted as well as pulmonary airspace and interstitial edema. 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 Recommend annual imaging followup by CTA or MRA. This recommendation follows 2010 ACCF/AHA/AATS/ACR/ASA/SCA/SCAI/SIR/STS/SVM Guidelines for the Diagnosis and Management of Patients with Thoracic Aortic Disease. Circulation. 2010; 121: U272-Z366. Aortic aneurysm NOS (ICD10-I71.9) Calcification in the gallbladder wall without focal mass. 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 Heavy coronary calcifications consistent with the given clinical history of recent STEMI. Aortic Atherosclerosis (ICD10-I70.0). Electronically Signed   By: Inez Catalina M.D.   On: 01/22/2020 00:27   CARDIAC CATHETERIZATION  Result Date: 01/21/2020  Heavily calcified severe three-vessel coronary artery disease.  Decreased LVEF, 35 to 40% with anteroapical moderate hypokinesis.  LVEDP 22%.  Acute on chronic combined systolic and diastolic heart failure.  30% proximal left main  Heavily calcified 95% proximal LAD followed by total occlusion in the mid vessel.  99% ostial to proximal circumflex proximal to a previously placed stent which is patent.  2 large obtuse marginal branches are noted  distally.  RCA is dominant.  There is eccentric 85% possible acute lesion in the RCA.  Segmental 75 to 90% distal RCA with severe diffuse disease in the PDA and LV branches with left to right collaterals.  Large acute marginal branch supplies collaterals around the apex to the LAD. RECOMMENDATIONS:  Guarded prognosis given age, LV function, and multifocal high-grade three-vessel CAD.  Needs to be evaluated to determine if he is a surgical candidate.  IV nitroglycerin  Resume IV heparin  Cycle cardiac markers   DG Chest Portable 1 View  Result Date: 01/21/2020 CLINICAL DATA:  Chest pain EXAM: PORTABLE CHEST 1 VIEW COMPARISON:  None. FINDINGS: Cardiac shadow is at the upper limits of normal in size. Increased vascular congestion with interstitial edema is noted consistent with CHF. No bony abnormality is seen. No sizable effusion is noted. IMPRESSION: Changes of CHF. Electronically Signed   By: Inez Catalina M.D.   On: 01/21/2020 03:56   ECHOCARDIOGRAM COMPLETE  Result Date: 01/21/2020    ECHOCARDIOGRAM REPORT   Patient Name:  Moses Sheldon Date of Exam: 01/21/2020 Medical Rec #:  [id number]    Height:       69.5 in Accession #:    [id number]   Weight:       179.2 lb Date of Birth:  07-12-1930    BSA:          1.982 m Patient Age:    39 years     BP:           122/92 mmHg Patient Gender: M            HR:           99 bpm. Exam Location:  Inpatient Procedure: 2D Echo and Intracardiac Opacification Agent Indications:    Chest Pain R07.9  History:        Patient has no prior history of Echocardiogram examinations.                 CAD; Risk Factors:Hypertension and Dyslipidemia.  Sonographer:    Mikki Santee RDCS (AE) Referring Phys: [id number] TRACI R TURNER IMPRESSIONS  1. Left ventricular ejection fraction, by estimation, is 30 to 35%. The left ventricle has moderately decreased function. The left ventricle demonstrates regional wall motion abnormalities (see scoring diagram/findings for description). Left  ventricular  diastolic parameters are consistent with Grade I diastolic dysfunction (impaired relaxation).  2. Right ventricular systolic function is normal. The right ventricular size is normal. There is mildly elevated pulmonary artery systolic pressure. The estimated right ventricular systolic pressure is 34.1 mmHg.  3. Left atrial size was moderately dilated.  4. The mitral valve is degenerative. Mild to moderate mitral valve regurgitation. No evidence of mitral stenosis.  5. The aortic valve was not well visualized. Aortic valve regurgitation is mild. Mild aortic valve sclerosis is present, with no evidence of aortic valve stenosis. Aortic regurgitation PHT measures 454 msec. Aortic valve mean gradient measures 5.4 mmHg.  Aortic valve Vmax measures 1.49 m/s.  6. The inferior vena cava is normal in size with <50% respiratory variability, suggesting right atrial pressure of 8 mmHg. Conclusion(s)/Recommendation(s): Findings consistent with ischemic cardiomyopathy. FINDINGS  Left Ventricle: Left ventricular ejection fraction, by estimation, is 30 to 35%. The left ventricle has moderately decreased function. The left ventricle demonstrates regional wall motion abnormalities. Definity contrast agent was given IV to delineate the left ventricular endocardial borders. The left ventricular internal cavity size was normal in size. There is no left ventricular hypertrophy. Left ventricular diastolic parameters are consistent with Grade I diastolic dysfunction (impaired relaxation).  LV Wall Scoring: The mid and distal anterior wall, mid and distal anterior septum, apical inferior segment, and apex are hypokinetic. Right Ventricle: The right ventricular size is normal. No increase in right ventricular wall thickness. Right ventricular systolic function is normal. There is mildly elevated pulmonary artery systolic pressure. The tricuspid regurgitant velocity is 3.00  m/s, and with an assumed right atrial pressure of 8 mmHg,  the estimated right ventricular systolic pressure is 96.2 mmHg. Left Atrium: Left atrial size was moderately dilated. Right Atrium: Right atrial size was normal in size. Pericardium: There is no evidence of pericardial effusion. Mitral Valve: The mitral valve is degenerative in appearance. There is moderate calcification of the posterior mitral valve leaflet(s). Mild to moderate mitral annular calcification. Mild to moderate mitral valve regurgitation. No evidence of mitral valve stenosis. Tricuspid Valve: The tricuspid valve is grossly normal. Tricuspid valve regurgitation is mild . No evidence of tricuspid stenosis. Aortic Valve: The aortic valve was not well  visualized. Aortic valve regurgitation is mild. Aortic regurgitation PHT measures 454 msec. Mild aortic valve sclerosis is present, with no evidence of aortic valve stenosis. Aortic valve mean gradient measures  5.4 mmHg. Aortic valve peak gradient measures 8.9 mmHg. Aortic valve area, by VTI measures 2.18 cm. Pulmonic Valve: The pulmonic valve was grossly normal. Pulmonic valve regurgitation is not visualized. No evidence of pulmonic stenosis. Aorta: The aortic root and ascending aorta are structurally normal, with no evidence of dilitation. Venous: The inferior vena cava is normal in size with less than 50% respiratory variability, suggesting right atrial pressure of 8 mmHg. IAS/Shunts: The atrial septum is grossly normal.  LEFT VENTRICLE PLAX 2D LVIDd:         4.73 cm LVIDs:         3.65 cm LV PW:         1.04 cm LV IVS:        1.05 cm LVOT diam:     2.10 cm LV SV:         50 LV SV Index:   25 LVOT Area:     3.46 cm  RIGHT VENTRICLE RV S prime:     7.40 cm/s TAPSE (M-mode): 0.9 cm LEFT ATRIUM              Index       RIGHT ATRIUM           Index LA diam:        4.00 cm  2.02 cm/m  RA Area:     18.60 cm LA Vol (A2C):   113.0 ml 57.02 ml/m RA Volume:   46.60 ml  23.51 ml/m LA Vol (A4C):   91.5 ml  46.17 ml/m LA Biplane Vol: 103.0 ml 51.97 ml/m   AORTIC VALVE AV Area (Vmax):    2.08 cm AV Area (Vmean):   1.78 cm AV Area (VTI):     2.18 cm AV Vmax:           148.77 cm/s AV Vmean:          111.939 cm/s AV VTI:            0.228 m AV Peak Grad:      8.9 mmHg AV Mean Grad:      5.4 mmHg LVOT Vmax:         89.50 cm/s LVOT Vmean:        57.500 cm/s LVOT VTI:          0.143 m LVOT/AV VTI ratio: 0.63 AI PHT:            454 msec  AORTA Ao Root diam: 3.80 cm Ao Asc diam:  3.80 cm MITRAL VALVE                 TRICUSPID VALVE MV Area (PHT): 6.60 cm      TR Peak grad:   36.0 mmHg MV Decel Time: 115 msec      TR Vmax:        300.00 cm/s MR Peak grad:    84.3 mmHg MR Mean grad:    49.0 mmHg   SHUNTS MR Vmax:         459.00 cm/s Systemic VTI:  0.14 m MR Vmean:        326.0 cm/s  Systemic Diam: 2.10 cm MR PISA:         0.57 cm MR PISA Eff ROA: 5 mm MR PISA Radius:  0.30 cm MV E velocity: 134.50 cm/s  MV A velocity: 73.50 cm/s MV E/A ratio:  1.83 Eleonore Chiquito MD Electronically signed by Eleonore Chiquito MD Signature Date/Time: 01/21/2020/5:41:04 PM    Final     Cardiac Studies   Chronic catheterization (01/21/2020)  Conclusion   Heavily calcified severe three-vessel coronary artery disease.  Decreased LVEF, 35 to 40% with anteroapical moderate hypokinesis.  LVEDP 22%.  Acute on chronic combined systolic and diastolic heart failure.  30% proximal left main  Heavily calcified 95% proximal LAD followed by total occlusion in the mid vessel.  99% ostial to proximal circumflex proximal to a previously placed stent which is patent.  2 large obtuse marginal branches are noted distally.  RCA is dominant.  There is eccentric 85% possible acute lesion in the RCA.  Segmental 75 to 90% distal RCA with severe diffuse disease in the PDA and LV branches with left to right collaterals.  Large acute marginal branch supplies collaterals around the apex to the LAD.  RECOMMENDATIONS:   Guarded prognosis given age, LV function, and multifocal high-grade three-vessel CAD.   Needs to be evaluated to determine if he is a surgical candidate.  IV nitroglycerin  Resume IV heparin  Cycle cardiac markers  Coronary Diagrams  Diagnostic Dominance: Right  Intervention  Implants    2D echocardiogram (01/21/2020)  IMPRESSIONS    1. Left ventricular ejection fraction, by estimation, is 30 to 35%. The  left ventricle has moderately decreased function. The left ventricle  demonstrates regional wall motion abnormalities (see scoring  diagram/findings for description). Left ventricular  diastolic parameters are consistent with Grade I diastolic dysfunction  (impaired relaxation).  2. Right ventricular systolic function is normal. The right ventricular  size is normal. There is mildly elevated pulmonary artery systolic  pressure. The estimated right ventricular systolic pressure is 74.2 mmHg.  3. Left atrial size was moderately dilated.  4. The mitral valve is degenerative. Mild to moderate mitral valve  regurgitation. No evidence of mitral stenosis.  5. The aortic valve was not well visualized. Aortic valve regurgitation  is mild. Mild aortic valve sclerosis is present, with no evidence of  aortic valve stenosis. Aortic regurgitation PHT measures 454 msec. Aortic  valve mean gradient measures 5.4 mmHg.  Aortic valve Vmax measures 1.49 m/s.  6. The inferior vena cava is normal in size with <50% respiratory  variability, suggesting right atrial pressure of 8 mmHg.   Conclusion(s)/Recommendation(s): Findings consistent with ischemic  cardiomyopathy.    Patient Profile     86 y.o. male married Caucasian male who lives independently with his wife.  He goes to the gym 3 days a week.  He does have a history remote CAD status post intervention in [place] over 20 years ago.  He has treated hypertension hyperlipidemia.  He is had recent chest pain with pain that was worse last night/this morning and was brought to the ER where he was evaluated by Dr.  Radford Pax and take him urgently to the Cath Lab by Dr. Tamala Julian revealing highly calcified vessels with severe left main/three-vessel disease and moderate LV dysfunction.  [facility] was consulted.  Assessment & Plan    1: Coronary artery disease-troponin is 4000.  His EKG shows ST segment elevation in lead aVR with depression otherwise suggesting global subendocardial ischemia.  Cath revealed severe calcified three-vessel disease with severe left main and subtotally occluded ostial circumflex, subtotally occluded mid LAD with right to left collaterals.  His anatomy is not suitable for percutaneous intervention.  He is high risk for CABG  but this may be his only option.  He is otherwise independent.  Dr. Kipp Brood from [facility] saw him yesterday and agreed that he was an acceptable surgical candidate.  He wanted to follow his renal function and 2D echocardiogram.  He is on IV nitroglycerin.  He denies chest pain.  2: Essential hypertension-on ARB and diltiazem as an outpatient.  His blood pressure is soft and these are currently on hold.  3: Hyperlipidemia-on atorvastatin with total cholesterol 181 and HDL 107.  4: Ischemic cardiomyopathy-EF 30% by 2D echo with mild to moderate MR and mild left ear.  I suspect this will improve with revascularization.  He was complaining of shortness of breath and orthopnea which was bonded to 40 mg of IV Lasix with a moderate diuresis.  We will repeat this today.  5: Moderate renal insufficiency-serum creatinine improved from 1.8 down to 1.37.  LV dysfunction not unexpected.  Renal function improving.  Anticipate surgical revascularization tomorrow per Dr. Kipp Brood.  For questions or updates, please contact [place] Please consult [email or web address] for contact info under        Signed, Quay Burow, MD  01/22/2020, 8:24 AM

## 2020-01-23 ENCOUNTER — Inpatient Hospital Stay (HOSPITAL_COMMUNITY): Payer: Medicare Other

## 2020-01-23 ENCOUNTER — Inpatient Hospital Stay (HOSPITAL_COMMUNITY)
Admission: EM | Disposition: A | Discharge status: Skilled Nursing Facility | Payer: Self-pay | Source: Home / Self Care | Attending: Thoracic Surgery (Cardiothoracic Vascular Surgery)

## 2020-01-23 DIAGNOSIS — I214 Non-ST elevation (NSTEMI) myocardial infarction: Secondary | ICD-10-CM

## 2020-01-23 DIAGNOSIS — I25110 Atherosclerotic heart disease of native coronary artery with unstable angina pectoris: Secondary | ICD-10-CM

## 2020-01-23 HISTORY — PX: TEE WITHOUT CARDIOVERSION: SHX5443

## 2020-01-23 HISTORY — PX: CORONARY ARTERY BYPASS GRAFT: SHX141

## 2020-01-23 LAB — POCT I-STAT 7, (LYTES, BLD GAS, ICA,H+H)
Acid-Base Excess: 1 mmol/L (ref 0.0–2.0)
Acid-base deficit: 2 mmol/L (ref 0.0–2.0)
Acid-base deficit: 1 mmol/L (ref 0.0–2.0)
Bicarbonate: 24.2 mmol/L (ref 20.0–28.0)
Bicarbonate: 25.1 mmol/L (ref 20.0–28.0)
Bicarbonate: 24.9 mmol/L (ref 20.0–28.0)
Calcium, Ion: 1.07 mmol/L — ABNORMAL LOW (ref 1.15–1.40)
Calcium, Ion: 1.1 mmol/L — ABNORMAL LOW (ref 1.15–1.40)
Calcium, Ion: 1.27 mmol/L (ref 1.15–1.40)
HCT: 28 % — ABNORMAL LOW (ref 39.0–52.0)
HCT: 26 % — ABNORMAL LOW (ref 39.0–52.0)
HCT: 26 % — ABNORMAL LOW (ref 39.0–52.0)
Hemoglobin: 9.5 g/dL — ABNORMAL LOW (ref 13.0–17.0)
Hemoglobin: 8.8 g/dL — ABNORMAL LOW (ref 13.0–17.0)
Hemoglobin: 8.8 g/dL — ABNORMAL LOW (ref 13.0–17.0)
O2 Saturation: 100 %
O2 Saturation: 100 %
O2 Saturation: 100 %
Potassium: 3.8 mmol/L (ref 3.5–5.1)
Potassium: 4.7 mmol/L (ref 3.5–5.1)
Potassium: 4.1 mmol/L (ref 3.5–5.1)
Sodium: 137 mmol/L (ref 135–145)
Sodium: 137 mmol/L (ref 135–145)
Sodium: 138 mmol/L (ref 135–145)
TCO2: 26 mmol/L (ref 22–32)
TCO2: 26 mmol/L (ref 22–32)
TCO2: 26 mmol/L (ref 22–32)
pCO2 arterial: 47.5 mmHg (ref 32.0–48.0)
pCO2 arterial: 37.2 mmHg (ref 32.0–48.0)
pCO2 arterial: 44.4 mmHg (ref 32.0–48.0)
pH, Arterial: 7.315 — ABNORMAL LOW (ref 7.350–7.450)
pH, Arterial: 7.438 (ref 7.350–7.450)
pH, Arterial: 7.358 (ref 7.350–7.450)
pO2, Arterial: 286 mmHg — ABNORMAL HIGH (ref 83.0–108.0)
pO2, Arterial: 281 mmHg — ABNORMAL HIGH (ref 83.0–108.0)
pO2, Arterial: 415 mmHg — ABNORMAL HIGH (ref 83.0–108.0)

## 2020-01-23 LAB — POCT I-STAT, CHEM 8
BUN: 48 mg/dL — ABNORMAL HIGH (ref 8–23)
BUN: 51 mg/dL — ABNORMAL HIGH (ref 8–23)
BUN: 48 mg/dL — ABNORMAL HIGH (ref 8–23)
BUN: 44 mg/dL — ABNORMAL HIGH (ref 8–23)
Calcium, Ion: 1.21 mmol/L (ref 1.15–1.40)
Calcium, Ion: 1.25 mmol/L (ref 1.15–1.40)
Calcium, Ion: 1.1 mmol/L — ABNORMAL LOW (ref 1.15–1.40)
Calcium, Ion: 1.37 mmol/L (ref 1.15–1.40)
Chloride: 100 mmol/L (ref 98–111)
Chloride: 101 mmol/L (ref 98–111)
Chloride: 101 mmol/L (ref 98–111)
Chloride: 99 mmol/L (ref 98–111)
Creatinine, Ser: 1.4 mg/dL — ABNORMAL HIGH (ref 0.61–1.24)
Creatinine, Ser: 1.5 mg/dL — ABNORMAL HIGH (ref 0.61–1.24)
Creatinine, Ser: 1.4 mg/dL — ABNORMAL HIGH (ref 0.61–1.24)
Creatinine, Ser: 1.2 mg/dL (ref 0.61–1.24)
Glucose, Bld: 203 mg/dL — ABNORMAL HIGH (ref 70–99)
Glucose, Bld: 189 mg/dL — ABNORMAL HIGH (ref 70–99)
Glucose, Bld: 165 mg/dL — ABNORMAL HIGH (ref 70–99)
Glucose, Bld: 140 mg/dL — ABNORMAL HIGH (ref 70–99)
HCT: 40 % (ref 39.0–52.0)
HCT: 38 % — ABNORMAL LOW (ref 39.0–52.0)
HCT: 26 % — ABNORMAL LOW (ref 39.0–52.0)
HCT: 25 % — ABNORMAL LOW (ref 39.0–52.0)
Hemoglobin: 13.6 g/dL (ref 13.0–17.0)
Hemoglobin: 12.9 g/dL — ABNORMAL LOW (ref 13.0–17.0)
Hemoglobin: 8.8 g/dL — ABNORMAL LOW (ref 13.0–17.0)
Hemoglobin: 8.5 g/dL — ABNORMAL LOW (ref 13.0–17.0)
Potassium: 4.1 mmol/L (ref 3.5–5.1)
Potassium: 4.1 mmol/L (ref 3.5–5.1)
Potassium: 4 mmol/L (ref 3.5–5.1)
Potassium: 4.4 mmol/L (ref 3.5–5.1)
Sodium: 135 mmol/L (ref 135–145)
Sodium: 135 mmol/L (ref 135–145)
Sodium: 136 mmol/L (ref 135–145)
Sodium: 137 mmol/L (ref 135–145)
TCO2: 20 mmol/L — ABNORMAL LOW (ref 22–32)
TCO2: 22 mmol/L (ref 22–32)
TCO2: 25 mmol/L (ref 22–32)
TCO2: 21 mmol/L — ABNORMAL LOW (ref 22–32)

## 2020-01-23 LAB — CBC
HCT: 39.9 % (ref 39.0–52.0)
HCT: 28.1 % — ABNORMAL LOW (ref 39.0–52.0)
HCT: 30.7 % — ABNORMAL LOW (ref 39.0–52.0)
Hemoglobin: 13.4 g/dL (ref 13.0–17.0)
Hemoglobin: 9.2 g/dL — ABNORMAL LOW (ref 13.0–17.0)
Hemoglobin: 10.1 g/dL — ABNORMAL LOW (ref 13.0–17.0)
MCH: 32.7 pg (ref 26.0–34.0)
MCH: 32.2 pg (ref 26.0–34.0)
MCH: 32 pg (ref 26.0–34.0)
MCHC: 33.6 g/dL (ref 30.0–36.0)
MCHC: 32.7 g/dL (ref 30.0–36.0)
MCHC: 32.9 g/dL (ref 30.0–36.0)
MCV: 97.3 fL (ref 80.0–100.0)
MCV: 98.3 fL (ref 80.0–100.0)
MCV: 97.2 fL (ref 80.0–100.0)
Platelets: 286 10*3/uL (ref 150–400)
Platelets: 141 10*3/uL — ABNORMAL LOW (ref 150–400)
Platelets: 125 10*3/uL — ABNORMAL LOW (ref 150–400)
RBC: 4.1 MIL/uL — ABNORMAL LOW (ref 4.22–5.81)
RBC: 2.86 MIL/uL — ABNORMAL LOW (ref 4.22–5.81)
RBC: 3.16 MIL/uL — ABNORMAL LOW (ref 4.22–5.81)
RDW: 14.4 % (ref 11.5–15.5)
RDW: 14.2 % (ref 11.5–15.5)
RDW: 14.4 % (ref 11.5–15.5)
WBC: 14.9 10*3/uL — ABNORMAL HIGH (ref 4.0–10.5)
WBC: 15.5 10*3/uL — ABNORMAL HIGH (ref 4.0–10.5)
WBC: 11.6 10*3/uL — ABNORMAL HIGH (ref 4.0–10.5)
nRBC: 0 % (ref 0.0–0.2)
nRBC: 0 % (ref 0.0–0.2)
nRBC: 0 % (ref 0.0–0.2)

## 2020-01-23 LAB — COMPREHENSIVE METABOLIC PANEL
ALT: 41 U/L (ref 0–44)
AST: 64 U/L — ABNORMAL HIGH (ref 15–41)
Albumin: 3.2 g/dL — ABNORMAL LOW (ref 3.5–5.0)
Alkaline Phosphatase: 82 U/L (ref 38–126)
Anion gap: 13 (ref 5–15)
BUN: 53 mg/dL — ABNORMAL HIGH (ref 8–23)
CO2: 20 mmol/L — ABNORMAL LOW (ref 22–32)
Calcium: 9.2 mg/dL (ref 8.9–10.3)
Chloride: 101 mmol/L (ref 98–111)
Creatinine, Ser: 1.66 mg/dL — ABNORMAL HIGH (ref 0.61–1.24)
GFR calc Af Amer: 42 mL/min — ABNORMAL LOW (ref >60)
GFR calc non Af Amer: 37 mL/min — ABNORMAL LOW (ref >60)
Glucose, Bld: 190 mg/dL — ABNORMAL HIGH (ref 70–99)
Potassium: 4.5 mmol/L (ref 3.5–5.1)
Sodium: 134 mmol/L — ABNORMAL LOW (ref 135–145)
Total Bilirubin: 1.6 mg/dL — ABNORMAL HIGH (ref 0.3–1.2)
Total Protein: 6.5 g/dL (ref 6.5–8.1)

## 2020-01-23 LAB — HEPARIN LEVEL (UNFRACTIONATED): Heparin Unfractionated: 0.2 IU/mL — ABNORMAL LOW (ref 0.30–0.70)

## 2020-01-23 LAB — BASIC METABOLIC PANEL
Anion gap: 10 (ref 5–15)
BUN: 47 mg/dL — ABNORMAL HIGH (ref 8–23)
CO2: 22 mmol/L (ref 22–32)
Calcium: 8 mg/dL — ABNORMAL LOW (ref 8.9–10.3)
Chloride: 106 mmol/L (ref 98–111)
Creatinine, Ser: 1.46 mg/dL — ABNORMAL HIGH (ref 0.61–1.24)
GFR calc Af Amer: 49 mL/min — ABNORMAL LOW (ref >60)
GFR calc non Af Amer: 43 mL/min — ABNORMAL LOW (ref >60)
Glucose, Bld: 150 mg/dL — ABNORMAL HIGH (ref 70–99)
Potassium: 3.9 mmol/L (ref 3.5–5.1)
Sodium: 138 mmol/L (ref 135–145)

## 2020-01-23 LAB — PROTIME-INR
INR: 1.6 — ABNORMAL HIGH (ref 0.8–1.2)
Prothrombin Time: 18.2 seconds — ABNORMAL HIGH (ref 11.4–15.2)

## 2020-01-23 LAB — GLUCOSE, CAPILLARY
Glucose-Capillary: 154 mg/dL — ABNORMAL HIGH (ref 70–99)
Glucose-Capillary: 173 mg/dL — ABNORMAL HIGH (ref 70–99)
Glucose-Capillary: 172 mg/dL — ABNORMAL HIGH (ref 70–99)
Glucose-Capillary: 158 mg/dL — ABNORMAL HIGH (ref 70–99)
Glucose-Capillary: 152 mg/dL — ABNORMAL HIGH (ref 70–99)
Glucose-Capillary: 127 mg/dL — ABNORMAL HIGH (ref 70–99)
Glucose-Capillary: 139 mg/dL — ABNORMAL HIGH (ref 70–99)
Glucose-Capillary: 148 mg/dL — ABNORMAL HIGH (ref 70–99)
Glucose-Capillary: 150 mg/dL — ABNORMAL HIGH (ref 70–99)
Glucose-Capillary: 129 mg/dL — ABNORMAL HIGH (ref 70–99)

## 2020-01-23 LAB — ECHO INTRAOPERATIVE TEE
Height: 69.5 in
Weight: 2903.02 oz

## 2020-01-23 LAB — HEMOGLOBIN AND HEMATOCRIT, BLOOD
HCT: 25.7 % — ABNORMAL LOW (ref 39.0–52.0)
Hemoglobin: 8.8 g/dL — ABNORMAL LOW (ref 13.0–17.0)

## 2020-01-23 LAB — APTT: aPTT: 40 seconds — ABNORMAL HIGH (ref 24–36)

## 2020-01-23 LAB — PLATELET COUNT: Platelets: 162 10*3/uL (ref 150–400)

## 2020-01-23 LAB — MAGNESIUM: Magnesium: 3.3 mg/dL — ABNORMAL HIGH (ref 1.7–2.4)

## 2020-01-23 SURGERY — CORONARY ARTERY BYPASS GRAFTING (CABG)
Anesthesia: General | Site: Chest

## 2020-01-23 MED ORDER — ONDANSETRON HCL 4 MG/2ML IJ SOLN
4.0000 mg | Freq: Four times a day (QID) | INTRAMUSCULAR | Status: DC | PRN
  Administered 2020-01-26 – 2020-02-05 (×6): 4 mg via INTRAVENOUS
  Filled 2020-01-23 (×6): qty 2

## 2020-01-23 MED ORDER — INSULIN REGULAR(HUMAN) IN NACL 100-0.9 UT/100ML-% IV SOLN: INTRAVENOUS | Status: DC

## 2020-01-23 MED ORDER — IPRATROPIUM BROMIDE 0.02 % IN SOLN: 0.5000 mg | Freq: Four times a day (QID) | RESPIRATORY_TRACT | Status: DC | PRN

## 2020-01-23 MED ORDER — MORPHINE SULFATE (PF) 2 MG/ML IV SOLN: 1.0000 mg | INTRAVENOUS | Status: DC | PRN

## 2020-01-23 MED ORDER — ROCURONIUM BROMIDE 10 MG/ML (PF) SYRINGE
PREFILLED_SYRINGE | INTRAVENOUS | Status: DC | PRN
  Administered 2020-01-23: 50 mg via INTRAVENOUS
  Administered 2020-01-23: 100 mg via INTRAVENOUS
  Administered 2020-01-23 (×2): 50 mg via INTRAVENOUS

## 2020-01-23 MED ORDER — CHLORHEXIDINE GLUCONATE CLOTH 2 % EX PADS
6.0000 | MEDICATED_PAD | Freq: Every day | CUTANEOUS | Status: DC
  Administered 2020-01-24 – 2020-02-03 (×11): 6 via TOPICAL

## 2020-01-23 MED ORDER — POTASSIUM CHLORIDE 10 MEQ/50ML IV SOLN: 10.0000 meq | INTRAVENOUS | Status: AC

## 2020-01-23 MED ORDER — SODIUM CHLORIDE (PF) 0.9 % IJ SOLN
OROMUCOSAL | Status: DC | PRN
  Administered 2020-01-23 (×3): 4 mL via TOPICAL

## 2020-01-23 MED ORDER — VASOPRESSIN 20 UNITS/100 ML INFUSION FOR SHOCK
0.0000 [IU]/min | INTRAVENOUS | Status: DC
  Administered 2020-01-23 – 2020-01-25 (×5): 0.04 [IU]/min via INTRAVENOUS
  Filled 2020-01-23 (×6): qty 100

## 2020-01-23 MED ORDER — FUROSEMIDE 10 MG/ML IJ SOLN
60.0000 mg | Freq: Once | INTRAMUSCULAR | Status: AC
  Administered 2020-01-23: 60 mg via INTRAVENOUS
  Filled 2020-01-23: qty 6

## 2020-01-23 MED ORDER — SODIUM CHLORIDE 0.9% FLUSH: 3.0000 mL | INTRAVENOUS | Status: DC | PRN

## 2020-01-23 MED ORDER — MAGNESIUM SULFATE 4 GM/100ML IV SOLN: 4.0000 g | Freq: Once | INTRAVENOUS | Status: AC

## 2020-01-23 MED ORDER — ACETAMINOPHEN 650 MG RE SUPP
650.0000 mg | Freq: Once | RECTAL | Status: AC
  Administered 2020-01-23: 650 mg via RECTAL

## 2020-01-23 MED ORDER — BISACODYL 5 MG PO TBEC
10.0000 mg | DELAYED_RELEASE_TABLET | Freq: Every day | ORAL | Status: DC
  Administered 2020-01-24 – 2020-02-06 (×14): 10 mg via ORAL
  Filled 2020-01-23 (×14): qty 2

## 2020-01-23 MED ORDER — FUROSEMIDE 10 MG/ML IJ SOLN
10.0000 mg/h | INTRAVENOUS | Status: DC
  Administered 2020-01-23: 10 mg/h via INTRAVENOUS
  Filled 2020-01-23: qty 25

## 2020-01-23 MED ORDER — NOREPINEPHRINE 4 MG/250ML-% IV SOLN
0.0000 ug/min | INTRAVENOUS | Status: DC
  Administered 2020-01-24: 5 ug/min via INTRAVENOUS
  Administered 2020-01-24: 6 ug/min via INTRAVENOUS
  Filled 2020-01-23 (×2): qty 250

## 2020-01-23 MED ORDER — LACTATED RINGERS IV SOLN: INTRAVENOUS | Status: DC | PRN

## 2020-01-23 MED ORDER — FAMOTIDINE IN NACL 20-0.9 MG/50ML-% IV SOLN
INTRAVENOUS | Status: AC
  Administered 2020-01-23: 20 mg
  Filled 2020-01-23: qty 50

## 2020-01-23 MED ORDER — VANCOMYCIN HCL IN DEXTROSE 1-5 GM/200ML-% IV SOLN
1000.0000 mg | Freq: Once | INTRAVENOUS | Status: AC
  Administered 2020-01-23: 1000 mg via INTRAVENOUS
  Filled 2020-01-23: qty 200

## 2020-01-23 MED ORDER — NITROGLYCERIN IN D5W 200-5 MCG/ML-% IV SOLN: 0.0000 ug/min | INTRAVENOUS | Status: DC

## 2020-01-23 MED ORDER — ACETAMINOPHEN 160 MG/5ML PO SOLN: 1000.0000 mg | Freq: Four times a day (QID) | ORAL | Status: DC

## 2020-01-23 MED ORDER — POTASSIUM CHLORIDE ER 10 MEQ PO TBCR
20.0000 meq | EXTENDED_RELEASE_TABLET | Freq: Every day | ORAL | Status: DC
  Administered 2020-01-23: 20 meq via ORAL
  Filled 2020-01-23: qty 2

## 2020-01-23 MED ORDER — DEXMEDETOMIDINE HCL IN NACL 400 MCG/100ML IV SOLN: 0.0000 ug/kg/h | INTRAVENOUS | Status: DC

## 2020-01-23 MED ORDER — EPINEPHRINE HCL 5 MG/250ML IV SOLN IN NS
0.0000 ug/min | INTRAVENOUS | Status: DC
  Administered 2020-01-23: 20:00:00 7 ug/min via INTRAVENOUS
  Filled 2020-01-23: qty 250

## 2020-01-23 MED ORDER — ORAL CARE MOUTH RINSE: 15.0000 mL | OROMUCOSAL | Status: DC

## 2020-01-23 MED ORDER — DOBUTAMINE IN D5W 4-5 MG/ML-% IV SOLN
2.5000 ug/kg/min | INTRAVENOUS | Status: DC
  Administered 2020-01-24: 7.5 ug/kg/min via INTRAVENOUS
  Administered 2020-01-26: 5 ug/kg/min via INTRAVENOUS
  Administered 2020-01-28: 6.974 ug/kg/min via INTRAVENOUS
  Administered 2020-01-29: 20 ug/kg/min via INTRAVENOUS
  Filled 2020-01-23 (×3): qty 250

## 2020-01-23 MED ORDER — BISACODYL 10 MG RE SUPP: 10.0000 mg | Freq: Every day | RECTAL | Status: DC

## 2020-01-23 MED ORDER — POTASSIUM CHLORIDE ER 10 MEQ PO TBCR: 20.0000 meq | EXTENDED_RELEASE_TABLET | Freq: Every day | ORAL | Status: DC

## 2020-01-23 MED ORDER — LACTATED RINGERS IV SOLN
INTRAVENOUS | Status: DC
  Administered 2020-01-27: 20 mL/h via INTRAVENOUS

## 2020-01-23 MED ORDER — ETOMIDATE 2 MG/ML IV SOLN
INTRAVENOUS | Status: DC | PRN
  Administered 2020-01-23: 16 mg via INTRAVENOUS

## 2020-01-23 MED ORDER — PHENYLEPHRINE HCL (PRESSORS) 10 MG/ML IV SOLN
INTRAVENOUS | Status: DC | PRN
Start: 2020-01-23 — End: 2020-01-23
  Administered 2020-01-23 (×4): 80 ug via INTRAVENOUS

## 2020-01-23 MED ORDER — SODIUM CHLORIDE 0.9% FLUSH
10.0000 mL | INTRAVENOUS | Status: DC | PRN
  Administered 2020-01-23: 10 mL

## 2020-01-23 MED ORDER — DOCUSATE SODIUM 100 MG PO CAPS
200.0000 mg | ORAL_CAPSULE | Freq: Every day | ORAL | Status: DC
  Administered 2020-01-24 – 2020-02-06 (×15): 200 mg via ORAL
  Filled 2020-01-23 (×15): qty 2

## 2020-01-23 MED ORDER — ACETAMINOPHEN 500 MG PO TABS
1000.0000 mg | ORAL_TABLET | Freq: Four times a day (QID) | ORAL | Status: DC
  Administered 2020-01-24 – 2020-01-27 (×14): 1000 mg via ORAL
  Filled 2020-01-23 (×14): qty 2

## 2020-01-23 MED ORDER — LACTATED RINGERS IV SOLN
INTRAVENOUS | Status: DC | PRN
Start: 2020-01-23 — End: 2020-01-23

## 2020-01-23 MED ORDER — ASPIRIN 81 MG PO CHEW
81.0000 mg | CHEWABLE_TABLET | Freq: Every day | ORAL | Status: DC
  Administered 2020-01-26 – 2020-01-28 (×2): 81 mg
  Filled 2020-01-23 (×2): qty 1

## 2020-01-23 MED ORDER — LACTATED RINGERS IV SOLN: INTRAVENOUS | Status: DC

## 2020-01-23 MED ORDER — SODIUM CHLORIDE 0.9% FLUSH
3.0000 mL | Freq: Two times a day (BID) | INTRAVENOUS | Status: DC
  Administered 2020-01-24 – 2020-02-03 (×17): 3 mL via INTRAVENOUS

## 2020-01-23 MED ORDER — MIDAZOLAM HCL 2 MG/2ML IJ SOLN: 2.0000 mg | INTRAMUSCULAR | Status: DC | PRN

## 2020-01-23 MED ORDER — LEVALBUTEROL HCL 0.63 MG/3ML IN NEBU: 0.6300 mg | INHALATION_SOLUTION | Freq: Four times a day (QID) | RESPIRATORY_TRACT | Status: DC | PRN

## 2020-01-23 MED ORDER — SODIUM CHLORIDE 0.9% FLUSH
10.0000 mL | Freq: Two times a day (BID) | INTRAVENOUS | Status: DC
  Administered 2020-01-23 – 2020-01-25 (×4): 10 mL
  Administered 2020-01-25: 20 mL
  Administered 2020-01-26 – 2020-02-01 (×12): 10 mL
  Administered 2020-02-01 – 2020-02-02 (×2): 20 mL
  Administered 2020-02-02 – 2020-02-05 (×6): 10 mL

## 2020-01-23 MED ORDER — ATORVASTATIN CALCIUM 40 MG PO TABS: 40.0000 mg | ORAL_TABLET | Freq: Every day | ORAL | Status: DC

## 2020-01-23 MED ORDER — SODIUM BICARBONATE 8.4 % IV SOLN
50.0000 meq | Freq: Once | INTRAVENOUS | Status: AC
  Administered 2020-01-23: 50 meq via INTRAVENOUS

## 2020-01-23 MED ORDER — CHLORHEXIDINE GLUCONATE 0.12 % MT SOLN
15.0000 mL | OROMUCOSAL | Status: AC
  Administered 2020-01-23: 15 mL via OROMUCOSAL

## 2020-01-23 MED ORDER — MAGNESIUM SULFATE 4 GM/100ML IV SOLN
INTRAVENOUS | Status: AC
  Administered 2020-01-23: 4 g via INTRAVENOUS
  Filled 2020-01-23: qty 100

## 2020-01-23 MED ORDER — SODIUM CHLORIDE 0.9 % IV SOLN
1.5000 g | Freq: Two times a day (BID) | INTRAVENOUS | Status: AC
  Administered 2020-01-23 – 2020-01-25 (×4): 1.5 g via INTRAVENOUS
  Filled 2020-01-23 (×4): qty 1.5

## 2020-01-23 MED ORDER — CHLORHEXIDINE GLUCONATE 0.12% ORAL RINSE (MEDLINE KIT)
15.0000 mL | Freq: Two times a day (BID) | OROMUCOSAL | Status: DC
  Administered 2020-01-23: 15 mL via OROMUCOSAL

## 2020-01-23 MED ORDER — PROPOFOL 10 MG/ML IV BOLUS
INTRAVENOUS | Status: AC
  Filled 2020-01-23: qty 20

## 2020-01-23 MED ORDER — ASPIRIN EC 81 MG PO TBEC
81.0000 mg | DELAYED_RELEASE_TABLET | Freq: Every day | ORAL | Status: DC
  Administered 2020-01-24 – 2020-02-06 (×13): 81 mg via ORAL
  Filled 2020-01-23 (×13): qty 1

## 2020-01-23 MED ORDER — MIDAZOLAM HCL 5 MG/5ML IJ SOLN
INTRAMUSCULAR | Status: DC | PRN
  Administered 2020-01-23 (×3): 2 mg via INTRAVENOUS

## 2020-01-23 MED ORDER — PLASMA-LYTE 148 IV SOLN
INTRAVENOUS | Status: DC | PRN
  Administered 2020-01-23: 500 mL

## 2020-01-23 MED ORDER — LACTATED RINGERS IV SOLN: 500.0000 mL | Freq: Once | INTRAVENOUS | Status: DC | PRN

## 2020-01-23 MED ORDER — SODIUM CHLORIDE 0.45 % IV SOLN: INTRAVENOUS | Status: DC | PRN

## 2020-01-23 MED ORDER — ASPIRIN EC 325 MG PO TBEC: 325.0000 mg | DELAYED_RELEASE_TABLET | Freq: Every day | ORAL | Status: DC

## 2020-01-23 MED ORDER — HEPARIN SODIUM (PORCINE) 1000 UNIT/ML IJ SOLN
INTRAMUSCULAR | Status: DC | PRN
  Administered 2020-01-23: 2500 [IU] via INTRAVENOUS
  Administered 2020-01-23: 22500 [IU] via INTRAVENOUS
  Administered 2020-01-23: 5000 [IU] via INTRAVENOUS

## 2020-01-23 MED ORDER — SODIUM CHLORIDE 0.9 % IV SOLN: 250.0000 mL | INTRAVENOUS | Status: DC

## 2020-01-23 MED ORDER — APIXABAN 5 MG PO TABS
5.0000 mg | ORAL_TABLET | Freq: Two times a day (BID) | ORAL | Status: DC
  Administered 2020-01-24 – 2020-01-31 (×16): 5 mg via ORAL
  Filled 2020-01-23 (×15): qty 1

## 2020-01-23 MED ORDER — OXYCODONE HCL 5 MG PO TABS
5.0000 mg | ORAL_TABLET | ORAL | Status: DC | PRN
  Administered 2020-01-24 (×2): 5 mg via ORAL
  Administered 2020-01-24: 10 mg via ORAL
  Administered 2020-01-25 – 2020-01-28 (×4): 5 mg via ORAL
  Filled 2020-01-23 (×2): qty 1
  Filled 2020-01-23: qty 2
  Filled 2020-01-23 (×2): qty 1
  Filled 2020-01-23: qty 2
  Filled 2020-01-23 (×2): qty 1

## 2020-01-23 MED ORDER — PROTAMINE SULFATE 10 MG/ML IV SOLN
INTRAVENOUS | Status: DC | PRN
  Administered 2020-01-23 (×2): 50 mg via INTRAVENOUS
  Administered 2020-01-23: 30 mg via INTRAVENOUS
  Administered 2020-01-23: 80 mg via INTRAVENOUS

## 2020-01-23 MED ORDER — LIDOCAINE HCL (CARDIAC) PF 100 MG/5ML IV SOSY: PREFILLED_SYRINGE | INTRAVENOUS | Status: DC | PRN

## 2020-01-23 MED ORDER — LIDOCAINE 2% (20 MG/ML) 5 ML SYRINGE
INTRAMUSCULAR | Status: DC | PRN
  Administered 2020-01-23: 80 mg via INTRAVENOUS

## 2020-01-23 MED ORDER — DULOXETINE HCL 20 MG PO CPEP
20.0000 mg | ORAL_CAPSULE | Freq: Every day | ORAL | Status: DC | PRN
  Administered 2020-01-26 – 2020-02-05 (×2): 20 mg via ORAL
  Filled 2020-01-23 (×4): qty 1

## 2020-01-23 MED ORDER — FAMOTIDINE IN NACL 20-0.9 MG/50ML-% IV SOLN
20.0000 mg | Freq: Two times a day (BID) | INTRAVENOUS | Status: AC
  Administered 2020-01-23: 20 mg via INTRAVENOUS
  Filled 2020-01-23: qty 50

## 2020-01-23 MED ORDER — SODIUM CHLORIDE 0.9 % IV SOLN: INTRAVENOUS | Status: DC | PRN

## 2020-01-23 MED ORDER — ACETAMINOPHEN 160 MG/5ML PO SOLN: 650.0000 mg | Freq: Once | ORAL | Status: AC

## 2020-01-23 MED ORDER — METOPROLOL TARTRATE 12.5 MG HALF TABLET
12.5000 mg | ORAL_TABLET | Freq: Two times a day (BID) | ORAL | Status: DC
  Administered 2020-01-24 – 2020-01-30 (×3): 12.5 mg via ORAL
  Filled 2020-01-23 (×5): qty 1

## 2020-01-23 MED ORDER — DEXTROSE 50 % IV SOLN: 0.0000 mL | INTRAVENOUS | Status: DC | PRN

## 2020-01-23 MED ORDER — VASOPRESSIN 20 UNIT/ML IV SOLN
INTRAVENOUS | Status: DC | PRN
  Administered 2020-01-23: .4 [IU]/min via INTRAVENOUS

## 2020-01-23 MED ORDER — METOPROLOL TARTRATE 5 MG/5ML IV SOLN: 2.5000 mg | INTRAVENOUS | Status: DC | PRN

## 2020-01-23 MED ORDER — MIDAZOLAM HCL (PF) 10 MG/2ML IJ SOLN
INTRAMUSCULAR | Status: AC
  Filled 2020-01-23: qty 2

## 2020-01-23 MED ORDER — PHENYLEPHRINE HCL-NACL 20-0.9 MG/250ML-% IV SOLN
0.0000 ug/min | INTRAVENOUS | Status: DC
  Filled 2020-01-23: qty 250

## 2020-01-23 MED ORDER — EPHEDRINE SULFATE 50 MG/ML IJ SOLN
INTRAMUSCULAR | Status: DC | PRN
  Administered 2020-01-23 (×2): 5 mg via INTRAVENOUS

## 2020-01-23 MED ORDER — SODIUM CHLORIDE 0.9 % IV SOLN: INTRAVENOUS | Status: DC

## 2020-01-23 MED ORDER — 0.9 % SODIUM CHLORIDE (POUR BTL) OPTIME
TOPICAL | Status: DC | PRN
  Administered 2020-01-23: 6000 mL

## 2020-01-23 MED ORDER — PANTOPRAZOLE SODIUM 40 MG PO TBEC
40.0000 mg | DELAYED_RELEASE_TABLET | Freq: Every day | ORAL | Status: DC
  Administered 2020-01-25 – 2020-02-06 (×13): 40 mg via ORAL
  Filled 2020-01-23 (×13): qty 1

## 2020-01-23 MED ORDER — MILRINONE LACTATE IN DEXTROSE 20-5 MG/100ML-% IV SOLN: 0.3000 ug/kg/min | INTRAVENOUS | Status: DC

## 2020-01-23 MED ORDER — NOREPINEPHRINE BITARTRATE 1 MG/ML IV SOLN
INTRAVENOUS | Status: DC | PRN
  Administered 2020-01-23 (×2): 8 mL via INTRAVENOUS

## 2020-01-23 MED ORDER — TRAMADOL HCL 50 MG PO TABS
50.0000 mg | ORAL_TABLET | ORAL | Status: DC | PRN
  Administered 2020-01-24 – 2020-01-26 (×2): 100 mg via ORAL
  Administered 2020-01-31: 50 mg via ORAL
  Filled 2020-01-23: qty 2
  Filled 2020-01-23: qty 1
  Filled 2020-01-23: qty 2

## 2020-01-23 MED ORDER — FENTANYL CITRATE (PF) 250 MCG/5ML IJ SOLN
INTRAMUSCULAR | Status: DC | PRN
  Administered 2020-01-23 (×2): 50 ug via INTRAVENOUS
  Administered 2020-01-23: 100 ug via INTRAVENOUS

## 2020-01-23 MED ORDER — DOBUTAMINE IN D5W 4-5 MG/ML-% IV SOLN
INTRAVENOUS | Status: DC | PRN
  Administered 2020-01-23: 2.5 ug/kg/min via INTRAVENOUS

## 2020-01-23 MED ORDER — ALBUMIN HUMAN 5 % IV SOLN
250.0000 mL | INTRAVENOUS | Status: AC | PRN
  Administered 2020-01-23 (×4): 12.5 g via INTRAVENOUS
  Filled 2020-01-23 (×2): qty 250

## 2020-01-23 MED ORDER — FENTANYL CITRATE (PF) 250 MCG/5ML IJ SOLN
INTRAMUSCULAR | Status: AC
  Filled 2020-01-23: qty 25

## 2020-01-23 MED ORDER — ASPIRIN 81 MG PO CHEW: 324.0000 mg | CHEWABLE_TABLET | Freq: Every day | ORAL | Status: DC

## 2020-01-23 MED ORDER — METOPROLOL TARTRATE 25 MG/10 ML ORAL SUSPENSION: 12.5000 mg | Freq: Two times a day (BID) | ORAL | Status: DC

## 2020-01-23 SURGICAL SUPPLY — 87 items
BAG DECANTER FOR FLEXI CONT (MISCELLANEOUS) ×4 IMPLANT
BLADE CLIPPER SURG (BLADE) ×4 IMPLANT
BLADE NEEDLE 3 SS STRL (BLADE) ×3 IMPLANT
BLADE NEEDLE 3MM SS STRL (BLADE) ×1
BLADE STERNUM SYSTEM 6 (BLADE) ×4 IMPLANT
BLADE SURG 11 STRL SS (BLADE) ×4 IMPLANT
BNDG ELASTIC 4X5.8 VLCR STR LF (GAUZE/BANDAGES/DRESSINGS) ×4 IMPLANT
BNDG ELASTIC 6X5.8 VLCR STR LF (GAUZE/BANDAGES/DRESSINGS) ×4 IMPLANT
BNDG GAUZE ELAST 4 BULKY (GAUZE/BANDAGES/DRESSINGS) ×4 IMPLANT
CABLE SURGICAL S-101-97-12 (CABLE) ×4 IMPLANT
CANISTER SUCT 3000ML PPV (MISCELLANEOUS) ×4 IMPLANT
CANNULA MC2 2 STG 29/37 NON-V (CANNULA) ×2 IMPLANT
CANNULA MC2 TWO STAGE (CANNULA) ×2
CANNULA NON VENT 20FR 12 (CANNULA) ×4 IMPLANT
CATH ROBINSON RED A/P 18FR (CATHETERS) ×8 IMPLANT
CLIP RETRACTION 3.0MM CORONARY (MISCELLANEOUS) ×4 IMPLANT
CLIP VESOCCLUDE MED 24/CT (CLIP) IMPLANT
CLIP VESOCCLUDE SM WIDE 24/CT (CLIP) ×4 IMPLANT
CONN ST 1/2X1/2  BEN (MISCELLANEOUS) ×2
CONN ST 1/2X1/2 BEN (MISCELLANEOUS) ×2 IMPLANT
CONNECTOR BLAKE 2:1 CARIO BLK (MISCELLANEOUS) ×4 IMPLANT
DEFOGGER ANTIFOG KIT (MISCELLANEOUS) ×4 IMPLANT
DERMABOND ADVANCED (GAUZE/BANDAGES/DRESSINGS) ×4
DERMABOND ADVANCED .7 DNX12 (GAUZE/BANDAGES/DRESSINGS) ×4 IMPLANT
DRAIN CHANNEL 19F RND (DRAIN) ×12 IMPLANT
DRAIN CONNECTOR BLAKE 1:1 (MISCELLANEOUS) ×4 IMPLANT
DRAPE CARDIOVASCULAR INCISE (DRAPES) ×2
DRAPE INCISE IOBAN 66X45 STRL (DRAPES) IMPLANT
DRAPE SLUSH/WARMER DISC (DRAPES) ×4 IMPLANT
DRAPE SRG 135X102X78XABS (DRAPES) ×2 IMPLANT
DRSG COVADERM 4X14 (GAUZE/BANDAGES/DRESSINGS) ×4 IMPLANT
ELECT BLADE 6.5 EXT (BLADE) ×4 IMPLANT
ELECT REM PT RETURN 9FT ADLT (ELECTROSURGICAL) ×8
ELECTRODE REM PT RTRN 9FT ADLT (ELECTROSURGICAL) ×4 IMPLANT
FELT TEFLON 1X6 (MISCELLANEOUS) ×4 IMPLANT
GAUZE SPONGE 4X4 12PLY STRL (GAUZE/BANDAGES/DRESSINGS) ×8 IMPLANT
GAUZE SPONGE 4X4 12PLY STRL LF (GAUZE/BANDAGES/DRESSINGS) ×8 IMPLANT
GLOVE BIO SURGEON STRL SZ7 (GLOVE) ×8 IMPLANT
GLOVE BIOGEL M STRL SZ7.5 (GLOVE) ×8 IMPLANT
GOWN STRL REUS W/ TWL LRG LVL3 (GOWN DISPOSABLE) ×8 IMPLANT
GOWN STRL REUS W/ TWL XL LVL3 (GOWN DISPOSABLE) ×4 IMPLANT
GOWN STRL REUS W/TWL LRG LVL3 (GOWN DISPOSABLE) ×8
GOWN STRL REUS W/TWL XL LVL3 (GOWN DISPOSABLE) ×4
HEMOSTAT POWDER SURGIFOAM 1G (HEMOSTASIS) ×12 IMPLANT
KIT BASIN OR (CUSTOM PROCEDURE TRAY) ×4 IMPLANT
KIT SUCTION CATH 14FR (SUCTIONS) ×4 IMPLANT
KIT TURNOVER KIT B (KITS) ×4 IMPLANT
KIT VASOVIEW HEMOPRO 2 VH 4000 (KITS) ×4 IMPLANT
LEAD PACING MYOCARDI (MISCELLANEOUS) ×8 IMPLANT
MARKER GRAFT CORONARY BYPASS (MISCELLANEOUS) ×12 IMPLANT
NS IRRIG 1000ML POUR BTL (IV SOLUTION) ×20 IMPLANT
PACK ACCESSORY CANNULA KIT (KITS) ×4 IMPLANT
PACK E OPEN HEART (SUTURE) ×4 IMPLANT
PACK OPEN HEART (CUSTOM PROCEDURE TRAY) ×4 IMPLANT
PAD ARMBOARD 7.5X6 YLW CONV (MISCELLANEOUS) ×8 IMPLANT
PAD ELECT DEFIB RADIOL ZOLL (MISCELLANEOUS) ×4 IMPLANT
PENCIL BUTTON HOLSTER BLD 10FT (ELECTRODE) ×4 IMPLANT
POSITIONER HEAD DONUT 9IN (MISCELLANEOUS) ×4 IMPLANT
PUNCH AORTIC ROTATE 4.0MM (MISCELLANEOUS) ×4 IMPLANT
SET CARDIOPLEGIA MPS 5001102 (MISCELLANEOUS) ×4 IMPLANT
SPONGE LAP 18X18 RF (DISPOSABLE) ×4 IMPLANT
SUPPORT HEART JANKE-BARRON (MISCELLANEOUS) ×4 IMPLANT
SUT BONE WAX W31G (SUTURE) ×4 IMPLANT
SUT ETHIBOND X763 2 0 SH 1 (SUTURE) ×8 IMPLANT
SUT MNCRL AB 3-0 PS2 18 (SUTURE) ×8 IMPLANT
SUT MNCRL AB 4-0 PS2 18 (SUTURE) ×4 IMPLANT
SUT PDS AB 1 CTX 36 (SUTURE) ×8 IMPLANT
SUT PROLENE 4 0 SH DA (SUTURE) ×4 IMPLANT
SUT PROLENE 5 0 C 1 36 (SUTURE) ×16 IMPLANT
SUT PROLENE 7 0 BV 1 (SUTURE) ×8 IMPLANT
SUT PROLENE 7 0 BV1 MDA (SUTURE) ×4 IMPLANT
SUT STEEL 6MS V (SUTURE) ×8 IMPLANT
SUT VIC AB 2-0 CT1 27 (SUTURE) ×2
SUT VIC AB 2-0 CT1 TAPERPNT 27 (SUTURE) ×2 IMPLANT
SYR 20ML ECCENTRIC (SYRINGE) ×4 IMPLANT
SYSTEM SAHARA CHEST DRAIN ATS (WOUND CARE) ×4 IMPLANT
TAPE CLOTH SURG 4X10 WHT LF (GAUZE/BANDAGES/DRESSINGS) ×4 IMPLANT
TAPE PAPER 2X10 WHT MICROPORE (GAUZE/BANDAGES/DRESSINGS) ×4 IMPLANT
TOWEL GREEN STERILE (TOWEL DISPOSABLE) ×4 IMPLANT
TOWEL GREEN STERILE FF (TOWEL DISPOSABLE) ×4 IMPLANT
TRAY FOLEY SLVR 16FR TEMP STAT (SET/KITS/TRAYS/PACK) ×4 IMPLANT
TUBE CONNECTING 12'X1/4 (SUCTIONS) ×1
TUBE CONNECTING 12X1/4 (SUCTIONS) ×3 IMPLANT
TUBING LAP HI FLOW INSUFFLATIO (TUBING) ×4 IMPLANT
UNDERPAD 30X36 HEAVY ABSORB (UNDERPADS AND DIAPERS) ×4 IMPLANT
WATER STERILE IRR 1000ML POUR (IV SOLUTION) ×8 IMPLANT
YANKAUER SUCT BULB TIP NO VENT (SUCTIONS) ×4 IMPLANT

## 2020-01-23 NOTE — Progress Notes (Signed)
Paged [phone] regarding worsening hypoxemic respiratory failure.  Evaluated at bedside.  Feels more dyspneic and wheezy.  Less dyspneic with elevation of HOB.  Exam significant for BP 120/80, rate controlled AF VR 90, RR 28 with O2 94% on 12L O2NC, desaturation with movement.  Bilateral wheezes, JVP grossly elevated.  Reviewed objective data.  Moderately/severely reduced LVEF with LVEDP 23 mmHg at time of LHC.  Renal function improved yesterday with diuretics.  CXR reviewed.  No flash edema but clear pulmonary venous congestion.  CT prior to surgery with bilateral pleural effusions, pulmonary edema, ?RLLL consolidation  Plan - Furosemide 60 mg x1 and start lasix infusion 10 mg/hr to facilitate aggressive diuresis prior to planned CABG.  If no significant improvement, review plans for OR today. -KCL 20 mEq, check electrolytes early morning. - Xopenex/ipratropium now and PRN to follow.  Suspect bronchospasm with contributing pulmonary edema. - Place Foley for accurate I&O  Albertha Ghee MD Cardiology Fellow [place]

## 2020-01-23 NOTE — Progress Notes (Signed)
     [address]       [place] [address]             [phone]       No events Today's Vitals   01/23/20 0200 01/23/20 0300 01/23/20 0400 01/23/20 0500  BP: 96/72 100/76 93/75 (!) 114/87  Pulse: 71 102 94 98  Resp: (!) 29 20 (!) 28 (!) 24  Temp:      TempSrc:      SpO2: 90% 94% 94% 97%  Weight:      Height:      PainSc:       Body mass index is 26.41 kg/m. Alert NAD Sinus  OR today for CABG [place]

## 2020-01-23 NOTE — Progress Notes (Signed)
  Echocardiogram Echocardiogram Transesophageal has been performed.  Bryan Dunn 01/23/2020, 9:50 AM

## 2020-01-23 NOTE — Anesthesia Procedure Notes (Addendum)
Procedure Name: Intubation Date/Time: 01/23/2020 8:01 AM Performed by: Glynda Jaeger, CRNA Pre-anesthesia Checklist: Patient identified, Emergency Drugs available, Suction available and Patient being monitored Patient Re-evaluated:Patient Re-evaluated prior to induction Oxygen Delivery Method: Circle System Utilized Preoxygenation: Pre-oxygenation with 100% oxygen Induction Type: IV induction Ventilation: Mask ventilation without difficulty Laryngoscope Size: Mac and 4 Grade View: Grade II Tube type: Oral Tube size: 8.0 mm Number of attempts: 1 Airway Equipment and Method: Stylet and Oral airway Placement Confirmation: ETT inserted through vocal cords under direct vision,  positive ETCO2 and breath sounds checked- equal and bilateral Secured at: 25 cm Tube secured with: Tape Dental Injury: Teeth and Oropharynx as per pre-operative assessment  Comments: Placed by D. [place], [place]

## 2020-01-23 NOTE — Anesthesia Procedure Notes (Signed)
Arterial Line Insertion Start/End7/28/2021 6:45 AM, 01/23/2020 6:50 AM Performed by: Glynda Jaeger, CRNA, CRNA  Patient location: Pre-op. Preanesthetic checklist: patient identified, IV checked, site marked, risks and benefits discussed, surgical consent, monitors and equipment checked, pre-op evaluation, timeout performed and anesthesia consent Lidocaine 1% used for infiltration radial was placed Catheter size: 20 Fr Hand hygiene performed  and maximum sterile barriers used   Attempts: 1 Procedure performed without using ultrasound guided technique. Following insertion, dressing applied and Biopatch. Post procedure assessment: normal and unchanged  Patient tolerated the procedure well with no immediate complications. Additional procedure comments: Placed by D. [place], [place].

## 2020-01-23 NOTE — Progress Notes (Signed)
ANTICOAGULATION CONSULT NOTE - Initial Consult  Pharmacy Consult for Apixaban Indication: Left atrial appendage thrombus  No Known Allergies  Patient Measurements: Height: 5' 9.5" (176.5 cm) Weight: 82.3 kg (181 lb 7 oz) IBW/kg (Calculated) : 71.85  Vital Signs: BP: 90/58 (07/28 1315) Pulse Rate: 115 (07/28 1315)  Labs: Recent Labs     0000 01/21/20 0325 01/21/20 0702 01/21/20 0851 01/21/20 1829 01/22/20 0136 01/22/20 0136 01/22/20 0933 01/23/20 0538 01/23/20 0823 01/23/20 0948 01/23/20 1015 01/23/20 1056 01/23/20 1056 01/23/20 1101 01/23/20 1101 01/23/20 1121 01/23/20 1121 01/23/20 1159 01/23/20 1220  HGB   < > 13.5  --   --   --  13.3   < >  --  13.4   < > 12.9*   < > 8.8*   < > 8.8*   < > 8.8*   < > 8.5* 8.8*  HCT   < > 41.2  --   --   --  39.5   < >  --  39.9   < > 38.0*   < > 26.0*   < > 25.7*   < > 26.0*  --  25.0* 26.0*  PLT   < > 328  --   --   --  233  --   --  286  --   --   --   --   --  162  --   --   --   --   --   APTT  --  33  --   --   --   --   --   --   --   --   --   --   --   --   --   --   --   --   --   --   LABPROT  --  12.4  --   --   --   --   --   --   --   --   --   --   --   --   --   --   --   --   --   --   INR  --  1.0  --   --   --   --   --   --   --   --   --   --   --   --   --   --   --   --   --   --   HEPARINUNFRC  --   --   --   --    < > 0.54  --  0.45 0.20*  --   --   --   --   --   --   --   --   --   --   --   CREATININE   < > 1.80*  --   --   --  1.37*   < >  --  1.66*   < > 1.50*  --  1.40*  --   --   --   --   --  1.20  --   TROPONINIHS  --  1,825* 4,392* 5,974*  --   --   --   --   --   --   --   --   --   --   --   --   --   --   --   --    < > =  values in this interval not displayed.    Estimated Creatinine Clearance: 44.1 mL/min (by C-G formula based on SCr of 1.2 mg/dL).   Medical History: Past Medical History:  Diagnosis Date   Benign essential HTN    CAD (coronary artery disease)    remote PCI > 20 years  ago in [place]   HLD (hyperlipidemia)    Neuropathy     Assessment: 86 year old male admitted with ACS/multivessel CAD. Patient underwent CABG today and a large left atrial appendage was noted intra-op. Orders received to start apixaban for thrombus tomorrow.   Hgb 13.4>8.8 postop, will order dose but follow up with surgery in am.   Goal of Therapy:  Monitor platelets by anticoagulation protocol: Yes   Plan:  Apixaban 5mg  bid Expecting to pull pacing wires in am prior administering   Erin Hearing PharmD., BCPS Clinical Pharmacist 01/23/2020 1:33 PM

## 2020-01-23 NOTE — Procedures (Signed)
Extubation Procedure Note  Patient Details:   Name: Bryan Dunn DOB: 1930-12-27 MRN: [id number]   Airway Documentation:    Vent end date: 01/23/20 Vent end time: 2209   Evaluation  O2 sats: stable throughout Complications: No apparent complications Patient did tolerate procedure well. Bilateral Breath Sounds: Clear   Yes  No stridor noted. Pt. Was able to speak name. VC 0.7L, NIF -20. Placed 4LNC, no distress noted at this time. RT will continue to monitor.  Gifford Shave 01/23/2020, 10:10 PM

## 2020-01-23 NOTE — Transfer of Care (Signed)
Immediate Anesthesia Transfer of Care Note  Patient: Bryan Dunn  Procedure(s) Performed: CORONARY ARTERY BYPASS GRAFTING (CABG), ON PUMP, TIMES FOUR, USING LEFT INTERNAL MAMMARY ARTERY AND ENDOSCOPICALLY HARVESTED LEFT GREATER SAPHENOUS VEIN (N/A Chest) TRANSESOPHAGEAL ECHOCARDIOGRAM (TEE) (N/A )  Patient Location: ICU  Anesthesia Type:General  Level of Consciousness: Patient remains intubated per anesthesia plan  Airway & Oxygen Therapy: Patient remains intubated per anesthesia plan and Patient placed on Ventilator (see vital sign flow sheet for setting)  Post-op Assessment: Report given to RN and Post -op Vital signs reviewed and stable  Post vital signs: Reviewed and stable  Last Vitals:  Vitals Value Taken Time  BP    Temp    Pulse    Resp    SpO2      Last Pain:  Vitals:   01/23/20 0000  TempSrc: Oral  PainSc:       Patients Stated Pain Goal: 1 (36/06/77 0340)  Complications: No complications documented.

## 2020-01-23 NOTE — Op Note (Signed)
[address]       [place] [id number]             [phone]                                          01/23/2020 Patient:  Milagros Reap Pre-Op Dx:  NSTEMI   Three-vessel coronary artery disease   Ischemic cardiomyopathy with an EF of 30%   Diabetes mellitus Post-op Dx:  Same   Left atrial appendage thrombus Procedure: CABG X 4.  LIMA LAD, reverse saphenous vein graft to PLV, reverse saphenous vein graft to OM1, reverse saphenous vein graft to first diagonal Endoscopic greater saphenous vein harvest on the left Intra-operative Transesophageal Echocardiogram  Surgeon and Role:      * Kervin Bones, Lucile Crater, MD - Primary    *E. Barrett, PA-C- assisting Anesthesia  general EBL: 500 ml Blood Administration: None Xclamp Time: 52 min Pump Time: 112 min  Drains: 19 F blake drain: R, L, mediastinal  Wires: Ventricular Counts: correct   Indications: This is an 86 year old gentleman that presents with an NSTEMI and three-vessel coronary disease.  He does have a history of coronary artery disease and has a patent stent, but has heavily calcified three-vessel disease.  He does have good distal targets on review of his left heart cath.  His ejection fraction is slightly down, but I am awaiting the echocardiogram for formal review of his right and left heart function.  Additionally will need further evaluation for any valvular disease.  He appears quite functional, thus likely would recover well from open heart surgery.  I would like for further evaluation of his kidney function.  On review of his labs he appears hypovolemic, but I explained to him that after contrast load he may develop a further kidney injury.  He is agreeable with following up on this tomorrow.  I will have a conversation with his family members now for further discussion of operative timing and recovery.  He is tentatively scheduled for 7/28 for CABG x4.  Findings: Preoperative transesophageal  echocardiogram showed severely reduced LV function, and moderately reduced RV function.  Additionally there was evidence of left atrial appendage thrombus.  There is good conduit, and a large LIMA.  Calcified vessels, but a good target on the LAD the obtuse marginal, and PLV.  There was a large first diagonal that was also a good target.  Good flows on vein grafts.  On post cardiopulmonary bypass TEE, the anterior wall looked improved.  He did require some inotropic and vasopressor support to separate from cardiopulmonary bypass.  Operative Technique: All invasive lines were placed in pre-op holding.  After the risks, benefits and alternatives were thoroughly discussed, the patient was brought to the operative theatre.  Anesthesia was induced, and the patient was prepped and draped in normal sterile fashion.  An appropriate surgical pause was performed, and pre-operative antibiotics were dosed accordingly.  We began with simultaneous incisions were made along the left leg for harvesting of the greater saphenous vein and the chest for the sternotomy.  In regards to the sternotomy, this was carried down with bovie cautery, and the sternum was divided with a reciprocating saw.  Meticulous hemostasis was obtained.  The left internal thoracic artery was exposed and harvested in in pedicled fashion.  The patient was systemically heparinized, and the artery  was divided distally, and placed in a papaverine sponge.    The sternal elevator was removed, and a retractor was placed.  The pericardium was divided in the midline and fashioned into a cradle with pericardial stitches.   After we confirmed an appropriate ACT, the ascending aorta was cannulated in standard fashion.  The right atrial appendage was used for venous cannulation site.  Cardiopulmonary bypass was initiated, and the heart retractor was placed. The cross clamp was applied, and a dose of anterograde cardioplegia was given with good arrest of the heart.   We moved to the posterior wall of the heart, and found a good target on the PLV.  An arteriotomy was made, and the vein graft was anastomosed to it in an end to side fashion.  Next we exposed the lateral wall, and found a good target on the: 1.  An end to side anastomosis with the vein graft was then created.  Next, we exposed the anterior wall of the heart and identified a good target on first diagonal.   An arteriotomy was created.  The vein was anastomosed in an end to side fashion.  Finally, we exposed a good target on the LAD, and fashioned an end to side anastomosis between it and the LITA.  We began to re-warm, and a re-animation dose of cardioplegia was given.  The heart was de-aired, and the cross clamp was removed.  Meticulous hemostasis was obtained.    A partial occludding clamp was then placed on the ascending aorta, and we created an end to side anastomosis between it and the proximal vein grafts.  The proximal sites were marked with rings.  Hemostasis was obtained, and we separated from cardiopulmonary bypass without event.the heparin was reversed with protamine.  Chest tubes and wires were placed, and the sternum was re-approximated with with sternal wires.  The soft tissue and skin were re-approximated wth absorbable suture.    The patient tolerated the procedure without any immediate complications, and was transferred to the ICU in guarded condition.  Tyon Cerasoli Bary Leriche

## 2020-01-23 NOTE — Progress Notes (Signed)
Pt chest and legs clipped for pre-op, CHG bath given and AM metoprolol administered. Report given to anesthesia, pt ring and two necklaces given to sons.

## 2020-01-23 NOTE — Progress Notes (Signed)
RT started heart wean at 20:41. Pt. Met all perimeters for wean process. Pt. Placed on 40% RR 4. RT reevaluated for next phase. Pt. Met perimeters and placed in CPAP/PS 10/5 40%. RT will continue to monitor .

## 2020-01-23 NOTE — Anesthesia Postprocedure Evaluation (Signed)
Anesthesia Post Note  Patient: Bryan Dunn  Procedure(s) Performed: CORONARY ARTERY BYPASS GRAFTING (CABG), ON PUMP, TIMES FOUR, USING LEFT INTERNAL MAMMARY ARTERY AND ENDOSCOPICALLY HARVESTED LEFT GREATER SAPHENOUS VEIN (N/A Chest) TRANSESOPHAGEAL ECHOCARDIOGRAM (TEE) (N/A )     Patient location during evaluation: SICU Anesthesia Type: General Level of consciousness: sedated Pain management: pain level controlled Vital Signs Assessment: post-procedure vital signs reviewed and stable Respiratory status: patient remains intubated per anesthesia plan Cardiovascular status: supported on multiple vasoactive infusions. Postop Assessment: no apparent nausea or vomiting Anesthetic complications: no   No complications documented.  Last Vitals:  Vitals:   01/23/20 1600 01/23/20 1700  BP: 112/78 (!) 80/54  Pulse: (!) 113 105  Resp: 16 16  Temp: 36.7 C 36.6 C  SpO2: 100% 100%    Last Pain:  Vitals:   01/23/20 1330  TempSrc: Core  PainSc:                  Catalina Gravel

## 2020-01-23 NOTE — Discharge Summary (Signed)
Physician Discharge Summary       [facility]       [place] [address]             [phone]    Patient ID: Bryan Dunn MRN: [id number] DOB/AGE: 28-Jun-1931 86 y.o.  Admit date: 01/21/2020 Discharge date: 02/06/2020  Admission Diagnoses: 1. NSTEMI (non-ST elevated myocardial infarction) ([place]) 2. Unstable angina ([place]) 3. CAD (coronary artery disease)  Discharge Diagnoses:  1. S/p CABG x4 2. Atrial fibrillation 3. Expected post op blood loss anemia 4. History of Benign essential HTN 5. History of HLD (hyperlipidemia) 6. History of Neuropathy      Consults: physicial therapy  Procedure (s):  CABG X 4.  LIMA LAD, reverse saphenous vein graft to PLV, reverse saphenous vein graft to OM1, reverse saphenous vein graft to first diagonal Endoscopic greater saphenous vein harvest on the left Intra-operative Transesophageal Echocardiogram by Dr. Kipp Brood on 01/23/2020.  History of Presenting Illness: Bryan Dunn is an 86 yo white male with known history of peripheral neuropathy, HTN, Hyperlipidemia, and history of CAD with PCI to his OM.  He presented to the ED with a 3 day complaint of chest pain, nausea, vomiting, and diarrhea.  He also admits that his medications were changed recently by his PCP with him taking his ARB nightly and Cardizem in the daytime.  The patient's pain had been occurring nightly with radiation to his left arm.  His son brought him to the ED for evaluation.  EKG was obtained and was markedly abnormal with ST elevation in aVR and V1.  There was diffuse ST depression throughout the rest of the EKG.  His troponin level was also elevated and he was ruled in for NSTEMI.  His creatinine level was also elevated at 1.8.  He was admitted for further care.  He underwent cardiac catheterization this morning which showed a reduced EF of 30% and multivessel CAD with LM involvement.  TCTS consult has been requested for possible coronary bypass procedure.  Currently  the patient is chest pain free.  He does continue to have nausea, vomiting, and diarrhea at times.  He overall is fairly active.  He works out 200 min per week, mostly on a stationary bike or a rowing machine.  He is able to walk some, but overall is limited due to the neuropathy of his lower extremity which is worse on the left side.  He denies history of smoking.  He denies history of diabetes.  He wishes to have surgery if he is felt to be a surgical candidate. Echo done 07/26 showed LVEF 30-35%, mild to moderate MR, and no other significant valvular disease. Dr. Kipp Brood discussed the need for coronary artery bypass grafting surgery. Potential risks, benefits, and complications of the surgery were discussed with the patient and he agreed to proceed with surgery. Pre operative carotid duplex US showed no significant internal carotid artery stenosis bilaterally. He underwent a CABG x 4 on 01/23/2020.  Brief Hospital Course:  The patient was extubated the evening of surgery without difficulty. He remained afebrile and hemodynamically stable. He was on multiple drips (Dobutamine, Neo Synephrine, Levophed, Epi, and Vasopressin). These were weaned as able for the first few days pos top. Gordy Councilman, a line, chest tubes, and foley were removed early in the post operative course.  In the operating room the TEE showed a thrombus in the patient's left atrial appendage.  He was started on Eliquis at 5 mg BID . He went into  a fib on 07/29 and was put on Amiodarone drip.  He has been hypotensive during his stay and was started on Midodrine to assist with this.  He developed worsening creatinine/renal function.  Nephrology was consulted and they felt the patient was likely experiencing ATN with decreased urinary output.  He ultimately required placement of dialysis catheter and had 1 session of HD on 02/01/2020.  His creatinine responded well.  He was started on IV diuresis and his urinary output picked up.  He required no  further dialysis sessions.  He was started on Cipro for a UTI.  He had ABL anemia. He did require a post op transfusion. Last H and H was 9.3/27.7.  He was weaned off the insulin drip.   The patient's glucose remained well controlled.The patient's HGA1C pre op was 6. He will be provided nutrition information with discharge paperwork. The patient was felt surgically stable for transfer from the ICU to PCTU for further convalescence on 02/03/2020 .He continues to progress with cardiac rehab. He was ambulating on room air with assistance.  His urinary output continues to improve.  His creatinine level has improved to 2.41. He has been tolerating a diet and has had a bowel movement.  Chest tube sutures will be removed the day of discharge. The patient is felt surgically stable for discharge today.   Latest Vital Signs: Blood pressure (!) 88/46, pulse (!) 56, temperature 98.1 F (36.7 C), temperature source Oral, resp. rate 17, height 5' 9.5" (1.765 m), weight 81.1 kg, SpO2 96 %.  Physical Exam:  General appearance: alert, cooperative and no distress Heart: regular rate and rhythm, S1, S2 normal, no murmur, click, rub or gallop Lungs: clear to auscultation bilaterally Abdomen: soft, non-tender; bowel sounds normal; no masses,  no organomegaly Extremities: 2+ pitting pedal edema Wound: clean and dry  Discharge Condition:  stable  Recent laboratory studies:  Lab Results  Component Value Date   WBC 12.3 (H) 02/06/2020   HGB 9.1 (L) 02/06/2020   HCT 27.8 (L) 02/06/2020   MCV 93.9 02/06/2020   PLT 361 02/06/2020   Lab Results  Component Value Date   NA 134 (L) 02/06/2020   K 4.1 02/06/2020   CL 96 (L) 02/06/2020   CO2 26 02/06/2020   CREATININE 2.38 (H) 02/06/2020   GLUCOSE 174 (H) 02/06/2020      Diagnostic Studies: DG Chest 1 View  Result Date: 01/28/2020 CLINICAL DATA:  Chest pain EXAM: CHEST  1 VIEW COMPARISON:  January 26, 2020 FINDINGS: Central catheter tip is in the superior vena  cava. There are mediastinal drains and a left chest tube, unchanged. No pneumothorax. There is mild bibasilar interstitial edema with small left pleural effusion. No consolidation. There is cardiomegaly with pulmonary vascularity normal. Patient is status post coronary artery bypass grafting. There is aortic atherosclerosis as well as foci of carotid artery calcification bilaterally. No adenopathy. No bone lesions. IMPRESSION: Stable cardiomegaly. Small left pleural effusion with bibasilar interstitial edema. There may be a degree of persistent congestive heart failure. No airspace consolidation. Tube and catheter positions as described without pneumothorax. Electronically Signed   By: Lowella Grip III M.D.   On: 01/28/2020 08:08   CT CHEST WO CONTRAST  Result Date: 01/22/2020 CLINICAL DATA:  History of recent STEMI and chest pain, evaluate for aortic disease EXAM: CT CHEST WITHOUT CONTRAST TECHNIQUE: Multidetector CT imaging of the chest was performed following the standard protocol without IV contrast. COMPARISON:  Chest x-ray from the previous day. FINDINGS:  Cardiovascular: Thoracic aorta demonstrates atherosclerotic calcifications of a mild degree. Mild dilatation of the ascending aorta to 4.3 cm is noted. Heavy coronary calcifications are noted consistent with the given clinical history. No cardiac enlargement is noted. Pulmonary artery as visualized appears within normal limits. Mediastinum/Nodes: Thoracic inlet is unremarkable. No sizable hilar or mediastinal adenopathy is noted. The esophagus is within normal limits. Lungs/Pleura: Lungs are well aerated bilaterally. Bilateral pleural effusions are seen with mild lower lobe consolidation identified. Interstitial thickening is noted as well as patchy central airspace opacity most consistent with congestive failure. These changes are slightly greater on the right than the left. They appear to have progressed somewhat in the interval from the prior  plain film examination. No pneumothorax is seen. Upper Abdomen: Calcification in the gallbladder wall is noted. No other focal abnormality in the upper abdomen is seen. Musculoskeletal: Degenerative changes of the thoracic spine are noted. IMPRESSION: Changes of congestive failure with bilateral pleural effusions increased from the prior plain film examination. Lower lobe consolidation is noted as well as pulmonary airspace and interstitial edema. 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 Recommend annual imaging followup by CTA or MRA. This recommendation follows 2010 ACCF/AHA/AATS/ACR/ASA/SCA/SCAI/SIR/STS/SVM Guidelines for the Diagnosis and Management of Patients with Thoracic Aortic Disease. Circulation. 2010; 121: D408-X448. Aortic aneurysm NOS (ICD10-I71.9) Calcification in the gallbladder wall without focal mass. 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 Heavy coronary calcifications consistent with the given clinical history of recent STEMI. Aortic Atherosclerosis (ICD10-I70.0). Electronically Signed   By: Inez Catalina M.D.   On: 01/22/2020 00:27   CARDIAC CATHETERIZATION  Result Date: 01/21/2020  Heavily calcified severe three-vessel coronary artery disease.  Decreased LVEF, 35 to 40% with anteroapical moderate hypokinesis.  LVEDP 22%.  Acute on chronic combined systolic and diastolic heart failure.  30% proximal left main  Heavily calcified 95% proximal LAD followed by total occlusion in the mid vessel.  99% ostial to proximal circumflex proximal to a previously placed stent which is patent.  2 large obtuse marginal branches are noted distally.  RCA is dominant.  There is eccentric 85% possible acute lesion in the RCA.  Segmental 75 to 90% distal RCA with severe diffuse disease in the PDA and LV branches with left to right collaterals.  Large acute marginal branch supplies collaterals around the apex to the LAD. RECOMMENDATIONS:  Guarded prognosis given age,  LV function, and multifocal high-grade three-vessel CAD.  Needs to be evaluated to determine if he is a surgical candidate.  IV nitroglycerin  Resume IV heparin  Cycle cardiac markers   DG Chest Port 1 View  Result Date: 02/03/2020 CLINICAL DATA:  Chest tube removal EXAM: PORTABLE CHEST 1 VIEW COMPARISON:  01/31/2020 chest radiograph. FINDINGS: Removal of left chest tube and mediastinal drains. Removal of right internal jugular central venous sheath. Left internal jugular central venous catheter terminates in the upper third of the SVC. Intact sternotomy wires. Stable cardiomediastinal silhouette with mild cardiomegaly. No pneumothorax. Small left pleural effusion. No right pleural effusion. Cephalization of the pulmonary vasculature without overt pulmonary edema. Mild bibasilar atelectasis, improved. IMPRESSION: 1. No pneumothorax. 2. Small left pleural effusion. 3. Mild bibasilar atelectasis, improved. 4. Stable mild cardiomegaly without overt pulmonary edema. Electronically Signed   By: Ilona Sorrel M.D.   On: 02/03/2020 14:05   DG CHEST PORT 1 VIEW  Result Date: 01/31/2020 CLINICAL DATA:  Line placement. EXAM: PORTABLE CHEST 1 VIEW COMPARISON:  January 28, 2020. FINDINGS: Stable cardiomegaly. Status post coronary  bypass graft. Right internal jugular catheter is unchanged in position. Interval placement of left internal jugular catheter with distal tip in expected position of the SVC. Left-sided chest tube is noted without pneumothorax. Probable mild bibasilar subsegmental atelectasis is noted. Bony thorax is unremarkable. IMPRESSION: Interval placement of left internal jugular catheter with distal tip in expected position of the SVC. Left-sided chest tube is noted without pneumothorax. Probable mild bibasilar subsegmental atelectasis. Electronically Signed   By: Marijo Conception M.D.   On: 01/31/2020 11:28   DG CHEST PORT 1 VIEW  Result Date: 01/26/2020 CLINICAL DATA:  Pneumothorax EXAM: PORTABLE CHEST  1 VIEW COMPARISON:  Chest x-rays dated 01/25/2020 and 01/24/2020. FINDINGS: Heart size and mediastinal contours are stable. RIGHT IJ sheath is stable in position. Mediastinal drains are stable in position. Mild central pulmonary vascular congestion. Probable mild bibasilar atelectasis. Probable small LEFT pleural effusion. No pneumothorax is seen. IMPRESSION: 1. Stable chest x-ray. Mild central pulmonary vascular congestion suggesting mild CHF/volume overload. 2. Probable mild bibasilar atelectasis and small LEFT pleural effusion. Electronically Signed   By: Franki Cabot M.D.   On: 01/26/2020 11:59   DG CHEST PORT 1 VIEW  Result Date: 01/25/2020 CLINICAL DATA:  Swan-Ganz catheter removal. Recent coronary artery bypass grafting EXAM: PORTABLE CHEST 1 VIEW COMPARISON:  January 24, 2020 FINDINGS: Swan-Ganz catheter has been removed. Cordis tip is near the junction of the right jugular vein and superior vena cava. Chest tube on the left remains. There are mediastinal drains, unchanged in position. No pneumothorax. There is a minimal left pleural effusion with slight bibasilar atelectasis. No edema or airspace opacity. There is stable cardiomegaly. No adenopathy. Bones are osteoporotic. IMPRESSION: And catheter positions as described without pneumothorax. Small left pleural effusion with mild bibasilar atelectasis. No consolidation. Stable cardiac prominence. Electronically Signed   By: Lowella Grip III M.D.   On: 01/25/2020 07:16   DG Chest Port 1 View  Result Date: 01/24/2020 CLINICAL DATA:  Open-heart surgery.  Chest tube.  Sore chest. EXAM: PORTABLE CHEST 1 VIEW COMPARISON:  01/23/2020. FINDINGS: Interim extubation and removal of NG tube. Swan-Ganz catheter and mediastinal drainage catheters in stable position. Prior CABG. Stable cardiomegaly. Low lung volumes with mild bilateral subsegmental atelectasis. No pleural effusion or pneumothorax. IMPRESSION: 1. Interim extubation removal of NG tube. Swan-Ganz  catheter and mediastinal drainage catheters in stable position. 2.  Prior CABG.  Stable cardiomegaly. 3.  Low lung volumes with mild bilateral subsegmental atelectasis. Electronically Signed   By: Marcello Moores  Register   On: 01/24/2020 07:00   DG Chest Port 1 View  Result Date: 01/23/2020 CLINICAL DATA:  Status post coronary bypass graft. EXAM: PORTABLE CHEST 1 VIEW COMPARISON:  Same day. FINDINGS: Stable cardiomegaly. Endotracheal and nasogastric tubes are in grossly good position. Right internal jugular Swan-Ganz catheter is noted with distal tip in expected position of main pulmonary artery. No pneumothorax or significant pleural effusion is noted. Lungs are clear. Bony thorax is unremarkable. IMPRESSION: Endotracheal and nasogastric tubes in grossly good position. No pneumothorax is noted. No acute cardiopulmonary abnormality seen. Electronically Signed   By: Marijo Conception M.D.   On: 01/23/2020 13:54   DG CHEST PORT 1 VIEW  Result Date: 01/23/2020 CLINICAL DATA:  Respiratory failure EXAM: PORTABLE CHEST 1 VIEW COMPARISON:  01/21/2020 FINDINGS: Cardiac shadow is stable. Vascular congestion with edema is again identified and stable. The degree of central edema is increased particularly on the right. No bony abnormality is noted. IMPRESSION: Persistent changes of CHF with  edema. The known effusions are not as well appreciated as on prior CT. Electronically Signed   By: Inez Catalina M.D.   On: 01/23/2020 00:49   DG Chest Portable 1 View  Result Date: 01/21/2020 CLINICAL DATA:  Chest pain EXAM: PORTABLE CHEST 1 VIEW COMPARISON:  None. FINDINGS: Cardiac shadow is at the upper limits of normal in size. Increased vascular congestion with interstitial edema is noted consistent with CHF. No bony abnormality is seen. No sizable effusion is noted. IMPRESSION: Changes of CHF. Electronically Signed   By: Inez Catalina M.D.   On: 01/21/2020 03:56   ECHOCARDIOGRAM COMPLETE  Result Date: 01/21/2020    ECHOCARDIOGRAM  REPORT   Patient Name:   RHONE Parkinson Date of Exam: 01/21/2020 Medical Rec #:  [id number]    Height:       69.5 in Accession #:    [id number]   Weight:       179.2 lb Date of Birth:  1931-03-14    BSA:          1.982 m Patient Age:    64 years     BP:           122/92 mmHg Patient Gender: M            HR:           99 bpm. Exam Location:  Inpatient Procedure: 2D Echo and Intracardiac Opacification Agent Indications:    Chest Pain R07.9  History:        Patient has no prior history of Echocardiogram examinations.                 CAD; Risk Factors:Hypertension and Dyslipidemia.  Sonographer:    Mikki Santee RDCS (AE) Referring Phys: [id number] TRACI R TURNER IMPRESSIONS  1. Left ventricular ejection fraction, by estimation, is 30 to 35%. The left ventricle has moderately decreased function. The left ventricle demonstrates regional wall motion abnormalities (see scoring diagram/findings for description). Left ventricular  diastolic parameters are consistent with Grade I diastolic dysfunction (impaired relaxation).  2. Right ventricular systolic function is normal. The right ventricular size is normal. There is mildly elevated pulmonary artery systolic pressure. The estimated right ventricular systolic pressure is 54.5 mmHg.  3. Left atrial size was moderately dilated.  4. The mitral valve is degenerative. Mild to moderate mitral valve regurgitation. No evidence of mitral stenosis.  5. The aortic valve was not well visualized. Aortic valve regurgitation is mild. Mild aortic valve sclerosis is present, with no evidence of aortic valve stenosis. Aortic regurgitation PHT measures 454 msec. Aortic valve mean gradient measures 5.4 mmHg.  Aortic valve Vmax measures 1.49 m/s.  6. The inferior vena cava is normal in size with <50% respiratory variability, suggesting right atrial pressure of 8 mmHg. Conclusion(s)/Recommendation(s): Findings consistent with ischemic cardiomyopathy. FINDINGS  Left Ventricle: Left ventricular ejection  fraction, by estimation, is 30 to 35%. The left ventricle has moderately decreased function. The left ventricle demonstrates regional wall motion abnormalities. Definity contrast agent was given IV to delineate the left ventricular endocardial borders. The left ventricular internal cavity size was normal in size. There is no left ventricular hypertrophy. Left ventricular diastolic parameters are consistent with Grade I diastolic dysfunction (impaired relaxation).  LV Wall Scoring: The mid and distal anterior wall, mid and distal anterior septum, apical inferior segment, and apex are hypokinetic. Right Ventricle: The right ventricular size is normal. No increase in right ventricular wall thickness. Right ventricular systolic function is normal. There is mildly  elevated pulmonary artery systolic pressure. The tricuspid regurgitant velocity is 3.00  m/s, and with an assumed right atrial pressure of 8 mmHg, the estimated right ventricular systolic pressure is 50.0 mmHg. Left Atrium: Left atrial size was moderately dilated. Right Atrium: Right atrial size was normal in size. Pericardium: There is no evidence of pericardial effusion. Mitral Valve: The mitral valve is degenerative in appearance. There is moderate calcification of the posterior mitral valve leaflet(s). Mild to moderate mitral annular calcification. Mild to moderate mitral valve regurgitation. No evidence of mitral valve stenosis. Tricuspid Valve: The tricuspid valve is grossly normal. Tricuspid valve regurgitation is mild . No evidence of tricuspid stenosis. Aortic Valve: The aortic valve was not well visualized. Aortic valve regurgitation is mild. Aortic regurgitation PHT measures 454 msec. Mild aortic valve sclerosis is present, with no evidence of aortic valve stenosis. Aortic valve mean gradient measures  5.4 mmHg. Aortic valve peak gradient measures 8.9 mmHg. Aortic valve area, by VTI measures 2.18 cm. Pulmonic Valve: The pulmonic valve was grossly  normal. Pulmonic valve regurgitation is not visualized. No evidence of pulmonic stenosis. Aorta: The aortic root and ascending aorta are structurally normal, with no evidence of dilitation. Venous: The inferior vena cava is normal in size with less than 50% respiratory variability, suggesting right atrial pressure of 8 mmHg. IAS/Shunts: The atrial septum is grossly normal.  LEFT VENTRICLE PLAX 2D LVIDd:         4.73 cm LVIDs:         3.65 cm LV PW:         1.04 cm LV IVS:        1.05 cm LVOT diam:     2.10 cm LV SV:         50 LV SV Index:   25 LVOT Area:     3.46 cm  RIGHT VENTRICLE RV S prime:     7.40 cm/s TAPSE (M-mode): 0.9 cm LEFT ATRIUM              Index       RIGHT ATRIUM           Index LA diam:        4.00 cm  2.02 cm/m  RA Area:     18.60 cm LA Vol (A2C):   113.0 ml 57.02 ml/m RA Volume:   46.60 ml  23.51 ml/m LA Vol (A4C):   91.5 ml  46.17 ml/m LA Biplane Vol: 103.0 ml 51.97 ml/m  AORTIC VALVE AV Area (Vmax):    2.08 cm AV Area (Vmean):   1.78 cm AV Area (VTI):     2.18 cm AV Vmax:           148.77 cm/s AV Vmean:          111.939 cm/s AV VTI:            0.228 m AV Peak Grad:      8.9 mmHg AV Mean Grad:      5.4 mmHg LVOT Vmax:         89.50 cm/s LVOT Vmean:        57.500 cm/s LVOT VTI:          0.143 m LVOT/AV VTI ratio: 0.63 AI PHT:            454 msec  AORTA Ao Root diam: 3.80 cm Ao Asc diam:  3.80 cm MITRAL VALVE  TRICUSPID VALVE MV Area (PHT): 6.60 cm      TR Peak grad:   36.0 mmHg MV Decel Time: 115 msec      TR Vmax:        300.00 cm/s MR Peak grad:    84.3 mmHg MR Mean grad:    49.0 mmHg   SHUNTS MR Vmax:         459.00 cm/s Systemic VTI:  0.14 m MR Vmean:        326.0 cm/s  Systemic Diam: 2.10 cm MR PISA:         0.57 cm MR PISA Eff ROA: 5 mm MR PISA Radius:  0.30 cm MV E velocity: 134.50 cm/s MV A velocity: 73.50 cm/s MV E/A ratio:  1.83 Eleonore Chiquito MD Electronically signed by Eleonore Chiquito MD Signature Date/Time: 01/21/2020/5:41:04 PM    Final    ECHO  INTRAOPERATIVE TEE  Result Date: 01/23/2020  *INTRAOPERATIVE TRANSESOPHAGEAL REPORT *  Patient Name:   [facility] Date of Exam: 01/23/2020 Medical Rec #:  [id number]    Height:       69.5 in Accession #:    [id number]   Weight:       181.4 lb Date of Birth:  Jun 21, 1931    BSA:          1.99 m Patient Age:    64 years     BP:           114/87 mmHg Patient Gender: M            HR:           92 bpm. Exam Location:  Inpatient Transesophogeal exam was perform intraoperatively during surgical procedure. Patient was closely monitored under general anesthesia during the entirety of examination. Indications:     CABG Performing Phys: [id number] Lucile Crater LIGHTFOOT Diagnosing Phys: Hoy Morn MD Complications: No known complications during this procedure. POST-OP IMPRESSIONS - Aorta: The aorta appears unchanged from pre-bypass. - Left Atrial Appendage: The left atrial appendage appears unchanged from pre-bypass. - Aortic Valve: The aortic valve appears unchanged from pre-bypass. - Mitral Valve: The mitral valve appears unchanged from pre-bypass. - Tricuspid Valve: The tricuspid valve appears unchanged from pre-bypass. - Interatrial Septum: The interatrial septum appears unchanged from pre-bypass. PRE-OP FINDINGS  Left Ventricle: The left ventricle has severely reduced systolic function, with an ejection fraction of 25-30%. The cavity size was normal. There is no increase in left ventricular wall thickness. Left ventricular diffuse hypokinesis. Right Ventricle: The right ventricle has moderately reduced systolic function. The cavity was moderately enlarged. There is no increase in right ventricular wall thickness. Left Atrium: Left atrial size was dilated. The left atrial appendage is well visualized and there is evidence of thrombus present. Left atrial appendage velocity is reduced at less than 40 cm/s. Right Atrium: Right atrial size was normal in size. Right atrial pressure is estimated at 10 mmHg. Interatrial Septum: No  atrial level shunt detected by color flow Doppler. Pericardium: A small pericardial effusion is present. There is a large pleural effusion in both left and right lateral regions. Mitral Valve: The mitral valve is degenerative in appearance. Mild calcification of the mitral valve leaflet. Mitral valve regurgitation is mild by color flow Doppler. There is No evidence of mitral stenosis. Tricuspid Valve: The tricuspid valve was normal in structure. Tricuspid valve regurgitation is mild by color flow Doppler. Aortic Valve: The aortic valve is tricuspid There is mild thickening of the aortic valve and There is  mild calcification of the aortic valve Aortic valve regurgitation is mild by color flow Doppler. Pulmonic Valve: The pulmonic valve was normal in structure. Pulmonic valve regurgitation is not visualized by color flow Doppler. Aorta: The aortic root, ascending aorta and aortic arch are normal in size and structure. +---------------+-----------++ TRICUSPID VALVE            +---------------+-----------++ TR Peak grad:  31.4 mmHg   +---------------+-----------++ TR Vmax:       280.00 cm/s +---------------+-----------++  Hoy Morn MD Electronically signed by Hoy Morn MD Signature Date/Time: 01/23/2020/5:21:20 PM    Final    VAS US DOPPLER PRE CABG  Result Date: 01/22/2020 PREOPERATIVE VASCULAR EVALUATION  Indications:      Pre-CABG. Risk Factors:     Hypertension, coronary artery disease. Comparison Study: no prior Performing Technologist: Abram Sander RVS  Examination Guidelines: A complete evaluation includes B-mode imaging, spectral Doppler, color Doppler, and power Doppler as needed of all accessible portions of each vessel. Bilateral testing is considered an integral part of a complete examination. Limited examinations for reoccurring indications may be performed as noted.  Right Carotid Findings: +----------+--------+--------+--------+------------+--------+           PSV cm/sEDV  cm/sStenosisDescribe    Comments +----------+--------+--------+--------+------------+--------+ CCA Prox  49      16              heterogenous         +----------+--------+--------+--------+------------+--------+ CCA Distal41      14              heterogenous         +----------+--------+--------+--------+------------+--------+ ICA Prox  82      25      1-39%   heterogenous         +----------+--------+--------+--------+------------+--------+ ICA Distal45      16                                   +----------+--------+--------+--------+------------+--------+ ECA       138     37                                   +----------+--------+--------+--------+------------+--------+ Portions of this table do not appear on this page. +----------+--------+-------+--------+------------+           PSV cm/sEDV cmsDescribeArm Pressure +----------+--------+-------+--------+------------+ [id number]                                  +----------+--------+-------+--------+------------+ +---------+--------+--+--------+--+---------+ VertebralPSV cm/s54EDV cm/s11Antegrade +---------+--------+--+--------+--+---------+ Left Carotid Findings: +----------+--------+--------+--------+------------+--------+           PSV cm/sEDV cm/sStenosisDescribe    Comments +----------+--------+--------+--------+------------+--------+ CCA Prox  58      15              heterogenous         +----------+--------+--------+--------+------------+--------+ CCA Distal75      25              heterogenous         +----------+--------+--------+--------+------------+--------+ ICA Prox  61      22      1-39%   heterogenous         +----------+--------+--------+--------+------------+--------+ ICA Distal63      19                                   +----------+--------+--------+--------+------------+--------+  ECA       79      14                                    +----------+--------+--------+--------+------------+--------+ +----------+--------+--------+--------+------------+ SubclavianPSV cm/sEDV cm/sDescribeArm Pressure +----------+--------+--------+--------+------------+           47                                   +----------+--------+--------+--------+------------+ +---------+--------+--------+--------------+ VertebralPSV cm/sEDV cm/sNot identified +---------+--------+--------+--------------+  ABI Findings: +--------+------------------+-----+---------+--------+ Right   Rt Pressure (mmHg)IndexWaveform Comment  +--------+------------------+-----+---------+--------+ Brachial                       triphasic         +--------+------------------+-----+---------+--------+ ATA                            triphasic         +--------+------------------+-----+---------+--------+ PTA                            triphasic         +--------+------------------+-----+---------+--------+ +--------+------------------+-----+---------+-------+ Left    Lt Pressure (mmHg)IndexWaveform Comment +--------+------------------+-----+---------+-------+ [id number]                    triphasic        +--------+------------------+-----+---------+-------+ ATA                            triphasic        +--------+------------------+-----+---------+-------+ PTA                            triphasic        +--------+------------------+-----+---------+-------+  Right Doppler Findings: +--------+--------+-----+---------+--------+ Site    PressureIndexDoppler  Comments +--------+--------+-----+---------+--------+ Brachial             triphasic         +--------+--------+-----+---------+--------+ Radial               triphasic         +--------+--------+-----+---------+--------+ Ulnar                triphasic         +--------+--------+-----+---------+--------+  Left Doppler Findings:  +--------+--------+-----+---------+--------+ Site    PressureIndexDoppler  Comments +--------+--------+-----+---------+--------+ [id number]          triphasic         +--------+--------+-----+---------+--------+ Radial               triphasic         +--------+--------+-----+---------+--------+ Ulnar                triphasic         +--------+--------+-----+---------+--------+  Summary: Right Carotid: Velocities in the right ICA are consistent with a 1-39% stenosis. Left Carotid: Velocities in the left ICA are consistent with a 1-39% stenosis. Vertebrals: Right vertebral artery demonstrates antegrade flow. Left vertebral             artery was not visualized. Right Upper Extremity: Doppler waveforms remain within normal limits with right radial compression. Doppler waveforms remain within normal limits with right ulnar compression. Left Upper Extremity: Doppler waveforms remain within normal limits  with left radial compression. Doppler waveform obliterate with left ulnar compression.  Electronically signed by Deitra Mayo MD on 01/22/2020 at 2:49:16 PM.    Final    ECHOCARDIOGRAM LIMITED  Result Date: 01/30/2020    ECHOCARDIOGRAM LIMITED REPORT   Patient Name:   MADEX Mccree Date of Exam: 01/30/2020 Medical Rec #:  [id number]    Height:       69.5 in Accession #:    [id number]   Weight:       201.7 lb Date of Birth:  May 28, 1931    BSA:          2.084 m Patient Age:    57 years     BP:           115/61 mmHg Patient Gender: M            HR:           63 bpm. Exam Location:  Inpatient Procedure: Limited Echo, Limited Color Doppler and Cardiac Doppler Indications:    Cardiomyopathy-Ischemic 414.8 / I25.5  History:        Patient has prior history of Echocardiogram examinations. CAD;                 Risk Factors:Dyslipidemia and Hypertension.  Sonographer:    Darlina Sicilian RDCS Referring Phys: [id number] [place]  1. Left ventricular ejection fraction, by estimation, is  45 to 50%. The left ventricle has mildly decreased function. The left ventricle demonstrates regional wall motion abnormalities (see scoring diagram/findings for description). There is mild concentric left ventricular hypertrophy. Left ventricular diastolic parameters are consistent with Grade II diastolic dysfunction (pseudonormalization). Elevated left atrial pressure. There is moderate of the left ventricular, mid-apical anteroseptal wall and anterior wall.  2. Left atrial size was severely dilated.  3. The mitral valve is normal in structure.  4. The aortic valve is tricuspid. Aortic valve regurgitation is mild. Mild to moderate aortic valve sclerosis/calcification is present, without any evidence of aortic stenosis.  5. Aortic dilatation noted. There is borderline dilatation of the ascending aorta measuring 39 mm.  6. There is mildly elevated pulmonary artery systolic pressure.  7. The inferior vena cava is dilated in size with <50% respiratory variability, suggesting right atrial pressure of 15 mmHg. Comparison(s): Prior images reviewed side by side. The left ventricular function has improved. The left ventricular wall motion abnormality is improved. Conclusion(s)/Recommendation(s): Diminutive atrial "kick" on mitral annulus tissue Doppler and mitral inflow suggests left atrial mechanical failure or stunning (such as following recent episode of atrial fibrillation). FINDINGS  Left Ventricle: Left ventricular ejection fraction, by estimation, is 45 to 50%. The left ventricle has mildly decreased function. The left ventricle demonstrates regional wall motion abnormalities. The left ventricular internal cavity size was normal in size. There is mild concentric left ventricular hypertrophy. Abnormal (paradoxical) septal motion consistent with post-operative status. Elevated left atrial pressure. Right Ventricle: There is mildly elevated pulmonary artery systolic pressure. The tricuspid regurgitant velocity is 2.04  m/s, and with an assumed right atrial pressure of 15 mmHg, the estimated right ventricular systolic pressure is 11.9 mmHg. Left Atrium: Left atrial size was severely dilated. Mitral Valve: The mitral valve is normal in structure. Mild mitral annular calcification. Tricuspid Valve: The tricuspid valve is normal in structure. Tricuspid valve regurgitation is trivial. Aortic Valve: The aortic valve is tricuspid. . There is moderate thickening and moderate calcification of the aortic valve. Aortic valve regurgitation is mild. Mild to moderate aortic valve sclerosis/calcification is  present, without any evidence of aortic stenosis. There is moderate thickening of the aortic valve. There is moderate calcification of the aortic valve. Pulmonic Valve: The pulmonic valve was grossly normal. Pulmonic valve regurgitation is not visualized. Aorta: Aortic dilatation noted and the aortic root is normal in size and structure. There is borderline dilatation of the ascending aorta measuring 39 mm. Venous: The inferior vena cava is dilated in size with less than 50% respiratory variability, suggesting right atrial pressure of 15 mmHg. LEFT VENTRICLE PLAX 2D LVIDd:         4.63 cm      Diastology LVIDs:         3.70 cm      LV e' lateral:   12.90 cm/s LV PW:         1.27 cm      LV E/e' lateral: 7.4 LV IVS:        1.25 cm      LV e' medial:    4.03 cm/s                             LV E/e' medial:  23.7  LV Volumes (MOD) LV vol d, MOD A2C: 92.5 ml LV vol d, MOD A4C: 113.0 ml LV vol s, MOD A2C: 51.5 ml LV vol s, MOD A4C: 70.8 ml LV SV MOD A2C:     41.0 ml LV SV MOD A4C:     113.0 ml LV SV MOD BP:      42.2 ml LEFT ATRIUM         Index LA diam:    5.20 cm 2.50 cm/m  AORTIC VALVE LVOT Vmax:   86.20 cm/s LVOT Vmean:  52.800 cm/s LVOT VTI:    0.135 m MITRAL VALVE               TRICUSPID VALVE MV Area (PHT): 4.15 cm    TR Peak grad:   16.6 mmHg MV Decel Time: 183 msec    TR Vmax:        204.00 cm/s MV E velocity: 95.50 cm/s MV A velocity:  36.80 cm/s  SHUNTS MV E/A ratio:  2.60        Systemic VTI: 0.14 m Dani Gobble Croitoru MD Electronically signed by Sanda Klein MD Signature Date/Time: 01/30/2020/2:21:22 PM    Final    ECHOCARDIOGRAM LIMITED  Result Date: 01/27/2020    ECHOCARDIOGRAM LIMITED REPORT   Patient Name:   HAYDON Reichl Date of Exam: 01/27/2020 Medical Rec #:  [id number]    Height:       69.5 in Accession #:    [id number]   Weight:       196.3 lb Date of Birth:  [birth year]    BSA:          2.060 m Patient Age:    35 years     BP:           91/76 mmHg Patient Gender: M            HR:           61 bpm. Exam Location:  Inpatient Procedure: Limited Echo, Limited Color Doppler and Cardiac Doppler Indications:    acute systolic chf 767.34  History:        Patient has prior history of Echocardiogram examinations, most                 recent 01/21/2020. Prior CABG; Risk Factors:Hypertension  and                 Dyslipidemia.  Sonographer:    Johny Chess Referring Phys: [id number] JIRCVEL Z ATKINS IMPRESSIONS  1. Left ventricular ejection fraction, by estimation, is 35 to 40%. The left ventricle has moderately decreased function. The left ventricle demonstrates regional wall motion abnormalities (see scoring diagram/findings for description).  2. Right ventricular systolic function is normal. The right ventricular size is normal. There is normal pulmonary artery systolic pressure.  3. The mitral valve is normal in structure. Mild mitral valve regurgitation. No evidence of mitral stenosis.  4. Tricuspid valve regurgitation is mild to moderate.  5. The aortic valve is normal in structure. Aortic valve regurgitation is moderate. Mild to moderate aortic valve sclerosis/calcification is present, without any evidence of aortic stenosis.  6. Aortic dilatation noted. There is mild dilatation of the ascending aorta measuring 38 mm.  7. The inferior vena cava is normal in size with greater than 50% respiratory variability, suggesting right atrial pressure of 3  mmHg. Comparison(s): The left ventricular function has improved. Prior EF 30-35%. FINDINGS  Left Ventricle: Left ventricular ejection fraction, by estimation, is 35 to 40%. The left ventricle has moderately decreased function. The left ventricle demonstrates regional wall motion abnormalities. The left ventricular internal cavity size was normal in size. There is no left ventricular hypertrophy.  LV Wall Scoring: The mid and distal anterior wall and mid anterolateral segment are hypokinetic. The mid inferolateral segment is normal. Right Ventricle: The right ventricular size is normal. No increase in right ventricular wall thickness. Right ventricular systolic function is normal. There is normal pulmonary artery systolic pressure. The tricuspid regurgitant velocity is 2.67 m/s, and  with an assumed right atrial pressure of 3 mmHg, the estimated right ventricular systolic pressure is 38.1 mmHg. Left Atrium: Left atrial size was normal in size. Right Atrium: Right atrial size was normal in size. Pericardium: There is no evidence of pericardial effusion. Mitral Valve: The mitral valve is normal in structure. Normal mobility of the mitral valve leaflets. Severe mitral annular calcification. Mild mitral valve regurgitation. No evidence of mitral valve stenosis. Tricuspid Valve: The tricuspid valve is normal in structure. Tricuspid valve regurgitation is mild to moderate. No evidence of tricuspid stenosis. Aortic Valve: The aortic valve is normal in structure.. There is severe thickening and severe calcifcation of the aortic valve. Aortic valve regurgitation is moderate. Aortic regurgitation PHT measures 483 msec. Mild to moderate aortic valve sclerosis/calcification is present, without any evidence of aortic stenosis. There is severe thickening of the aortic valve. There is severe calcifcation of the aortic valve. Pulmonic Valve: The pulmonic valve was normal in structure. Pulmonic valve regurgitation is not visualized.  No evidence of pulmonic stenosis. Aorta: Aortic dilatation noted. There is mild dilatation of the ascending aorta measuring 38 mm. Venous: The inferior vena cava is normal in size with greater than 50% respiratory variability, suggesting right atrial pressure of 3 mmHg. IAS/Shunts: No atrial level shunt detected by color flow Doppler. LEFT VENTRICLE PLAX 2D LVIDd:         5.10 cm      Diastology LVIDs:         4.20 cm      LV e' lateral:   9.57 cm/s LV PW:         1.30 cm      LV E/e' lateral: 11.2 LV IVS:        1.10 cm      LV  e' medial:    6.20 cm/s LVOT diam:     2.50 cm      LV E/e' medial:  17.3 LV SV:         91 LV SV Index:   44 LVOT Area:     4.91 cm  LV Volumes (MOD) LV vol d, MOD A4C: 101.0 ml LV vol s, MOD A4C: 65.1 ml LV SV MOD A4C:     101.0 ml IVC IVC diam: 1.50 cm LEFT ATRIUM         Index LA diam:    4.90 cm 2.38 cm/m  AORTIC VALVE LVOT Vmax:   98.90 cm/s LVOT Vmean:  72.500 cm/s LVOT VTI:    0.185 m AI PHT:      483 msec  AORTA Ao Root diam: 3.70 cm Ao Asc diam:  3.80 cm MITRAL VALVE                TRICUSPID VALVE MV Area (PHT): 4.80 cm     TR Peak grad:   28.5 mmHg MV Decel Time: 158 msec     TR Vmax:        267.00 cm/s MV E velocity: 107.00 cm/s MV A velocity: 42.60 cm/s   SHUNTS MV E/A ratio:  2.51         Systemic VTI:  0.18 m                             Systemic Diam: 2.50 cm Candee Furbish MD Electronically signed by Candee Furbish MD Signature Date/Time: 01/27/2020/10:56:05 AM    Final     Discharge Medications: Allergies as of 02/06/2020   No Known Allergies     Medication List    STOP taking these medications   diltiazem 120 MG 24 hr capsule Commonly known as: DILACOR XR   diphenhydramine-acetaminophen 25-500 MG Tabs tablet Commonly known as: TYLENOL PM   hydrochlorothiazide 25 MG tablet Commonly known as: HYDRODIURIL   irbesartan 150 MG tablet Commonly known as: AVAPRO   multivitamin capsule     TAKE these medications   acetaminophen 500 MG tablet Commonly known as:  TYLENOL Take 1,000 mg by mouth every 6 (six) hours as needed for mild pain.   amiodarone 200 MG tablet Commonly known as: PACERONE Take 1 tablet (200 mg total) by mouth 2 (two) times daily.   apixaban 2.5 MG Tabs tablet Commonly known as: ELIQUIS Take 1 tablet (2.5 mg total) by mouth 2 (two) times daily.   aspirin 81 MG EC tablet Take 1 tablet (81 mg total) by mouth daily. Swallow whole. Start taking on: February 07, 2020   atorvastatin 80 MG tablet Commonly known as: LIPITOR Take 1 tablet (80 mg total) by mouth daily. Start taking on: February 07, 2020 What changed:   medication strength  how much to take   DULoxetine 20 MG capsule Commonly known as: CYMBALTA Take 20 mg by mouth daily as needed (neuropathy).   furosemide 40 MG tablet Commonly known as: LASIX Take 1 tablet (40 mg total) by mouth daily. Start taking on: February 07, 2020   melatonin 3 MG Tabs tablet Take 1 tablet (3 mg total) by mouth at bedtime.   midodrine 5 MG tablet Commonly known as: PROAMATINE Take 3 tablets (15 mg total) by mouth 3 (three) times daily with meals.   ondansetron 4 MG tablet Commonly known as: Zofran Take 1 tablet (4 mg total) by mouth every 8 (  eight) hours as needed for nausea or vomiting.   potassium chloride SA 20 MEQ tablet Commonly known as: KLOR-CON Take 2 tablets (40 mEq total) by mouth daily. Start taking on: February 07, 2020   PRESERVISION AREDS 2 PO Take 1 tablet by mouth in the morning and at bedtime.   traMADol 50 MG tablet Commonly known as: ULTRAM Take 1-2 tablets (50-100 mg total) by mouth every 4 (four) hours as needed for moderate pain.      The patient has been discharged on:   1.Beta Blocker:  Yes [   ]                              No   [ no  ]                              If No, reason: hypotension   2.Ace Inhibitor/ARB: Yes [   ]                                     No  [  no  ]                                     If No, reason:  hypotension  3.Statin:   Yes [ yes  ]                  No  [   ]                  If No, reason:  4.Ecasa:  Yes  [ yes  ]                  No   [   ]                  If No, reason:  Follow Up Appointments:  Contact information for follow-up providers    Turner, Eber Hong, MD Follow up.   Specialty: Cardiology Contact information: [address] [place] [address] [phone]        Lajuana Matte, MD Follow up on 02/15/2020.   Specialty: Cardiothoracic Surgery Why: Appointment time is at 11:45am. Please bring your hospital paperwork Contact information: [place] [address] [phone]        Imogene Burn, PA-C Follow up.   Specialty: Cardiology Why: Your appointment is on 8/31 at 11:15am. please bring your hospital paperwork Contact information: [place] [address] [phone]            Contact information for after-discharge care    Destination    [facility] Preferred SNF .   Service: Skilled Nursing Contact information: [address] [place] [address] [phone]                  Signed: Terance Hart ContePA-C 02/06/2020, 11:02 AM

## 2020-01-23 NOTE — Brief Op Note (Signed)
01/21/2020 - 01/23/2020  8:29 AM  PATIENT:  Bryan Dunn  86 y.o. male  PRE-OPERATIVE DIAGNOSIS:  Coronary Artery Disease  POST-OPERATIVE DIAGNOSIS:  Coronary Artery Disease, Thrombus  PROCEDURE:  Procedure(s) with comments:  CORONARY ARTERY BYPASS GRAFTING x 4 -LIMA to LAD -SVG to OM -SVG to DIAGONAL -SVG to PDA  ENDOSCOPIC HARVEST GREATER SAPHENOUS VEIN -Left Leg  (Harvest time 30 min, Prep Time 15 min)  DRAINAGE OF BILATERAL PLEURAL EFFUSIONS  TRANSESOPHAGEAL ECHOCARDIOGRAM (TEE) (N/A)  SURGEON:  Surgeon(s) and Role:    * Lightfoot, Lucile Crater, MD - Primary  PHYSICIAN ASSISTANT: Ellwood Handler PA-C  ANESTHESIA:   general  EBL:  870 mL   BLOOD ADMINISTERED: CELLSAVER  DRAINS: Right and Left Pleural Chest Tubes   LOCAL MEDICATIONS USED:  NONE  SPECIMEN:  No Specimen  DISPOSITION OF SPECIMEN:  N/A  COUNTS:  YES  TOURNIQUET:  * No tourniquets in log *  DICTATION: .Dragon Dictation  PLAN OF CARE: Admit to inpatient   PATIENT DISPOSITION:  ICU - intubated and hemodynamically stable.   Delay start of Pharmacological VTE agent (>24hrs) due to surgical blood loss or risk of bleeding: yes

## 2020-01-24 ENCOUNTER — Encounter (HOSPITAL_COMMUNITY): Payer: Self-pay | Admitting: Thoracic Surgery (Cardiothoracic Vascular Surgery)

## 2020-01-24 ENCOUNTER — Inpatient Hospital Stay (HOSPITAL_COMMUNITY): Payer: Medicare Other

## 2020-01-24 LAB — POCT I-STAT 7, (LYTES, BLD GAS, ICA,H+H)
Acid-Base Excess: 1 mmol/L (ref 0.0–2.0)
Acid-Base Excess: 1 mmol/L (ref 0.0–2.0)
Acid-base deficit: 4 mmol/L — ABNORMAL HIGH (ref 0.0–2.0)
Bicarbonate: 23.6 mmol/L (ref 20.0–28.0)
Bicarbonate: 26.1 mmol/L (ref 20.0–28.0)
Bicarbonate: 26.2 mmol/L (ref 20.0–28.0)
Calcium, Ion: 1.25 mmol/L (ref 1.15–1.40)
Calcium, Ion: 1.12 mmol/L — ABNORMAL LOW (ref 1.15–1.40)
Calcium, Ion: 1.11 mmol/L — ABNORMAL LOW (ref 1.15–1.40)
HCT: 33 % — ABNORMAL LOW (ref 39.0–52.0)
HCT: 27 % — ABNORMAL LOW (ref 39.0–52.0)
HCT: 27 % — ABNORMAL LOW (ref 39.0–52.0)
Hemoglobin: 11.2 g/dL — ABNORMAL LOW (ref 13.0–17.0)
Hemoglobin: 9.2 g/dL — ABNORMAL LOW (ref 13.0–17.0)
Hemoglobin: 9.2 g/dL — ABNORMAL LOW (ref 13.0–17.0)
O2 Saturation: 94 %
O2 Saturation: 100 %
O2 Saturation: 98 %
Patient temperature: 37
Patient temperature: 36.8
Patient temperature: 36.7
Potassium: 4 mmol/L (ref 3.5–5.1)
Potassium: 4.3 mmol/L (ref 3.5–5.1)
Potassium: 5.1 mmol/L (ref 3.5–5.1)
Sodium: 138 mmol/L (ref 135–145)
Sodium: 139 mmol/L (ref 135–145)
Sodium: 138 mmol/L (ref 135–145)
TCO2: 25 mmol/L (ref 22–32)
TCO2: 27 mmol/L (ref 22–32)
TCO2: 28 mmol/L (ref 22–32)
pCO2 arterial: 52.5 mmHg — ABNORMAL HIGH (ref 32.0–48.0)
pCO2 arterial: 41.3 mmHg (ref 32.0–48.0)
pCO2 arterial: 42.6 mmHg (ref 32.0–48.0)
pH, Arterial: 7.26 — ABNORMAL LOW (ref 7.350–7.450)
pH, Arterial: 7.408 (ref 7.350–7.450)
pH, Arterial: 7.396 (ref 7.350–7.450)
pO2, Arterial: 81 mmHg — ABNORMAL LOW (ref 83.0–108.0)
pO2, Arterial: 176 mmHg — ABNORMAL HIGH (ref 83.0–108.0)
pO2, Arterial: 112 mmHg — ABNORMAL HIGH (ref 83.0–108.0)

## 2020-01-24 LAB — BASIC METABOLIC PANEL
Anion gap: 11 (ref 5–15)
Anion gap: 14 (ref 5–15)
BUN: 45 mg/dL — ABNORMAL HIGH (ref 8–23)
BUN: 45 mg/dL — ABNORMAL HIGH (ref 8–23)
CO2: 22 mmol/L (ref 22–32)
CO2: 16 mmol/L — ABNORMAL LOW (ref 22–32)
Calcium: 7.9 mg/dL — ABNORMAL LOW (ref 8.9–10.3)
Calcium: 7.9 mg/dL — ABNORMAL LOW (ref 8.9–10.3)
Chloride: 104 mmol/L (ref 98–111)
Chloride: 104 mmol/L (ref 98–111)
Creatinine, Ser: 1.45 mg/dL — ABNORMAL HIGH (ref 0.61–1.24)
Creatinine, Ser: 1.57 mg/dL — ABNORMAL HIGH (ref 0.61–1.24)
GFR calc Af Amer: 50 mL/min — ABNORMAL LOW (ref >60)
GFR calc Af Amer: 45 mL/min — ABNORMAL LOW (ref >60)
GFR calc non Af Amer: 43 mL/min — ABNORMAL LOW (ref >60)
GFR calc non Af Amer: 39 mL/min — ABNORMAL LOW (ref >60)
Glucose, Bld: 139 mg/dL — ABNORMAL HIGH (ref 70–99)
Glucose, Bld: 218 mg/dL — ABNORMAL HIGH (ref 70–99)
Potassium: 3.7 mmol/L (ref 3.5–5.1)
Potassium: 4.2 mmol/L (ref 3.5–5.1)
Sodium: 137 mmol/L (ref 135–145)
Sodium: 134 mmol/L — ABNORMAL LOW (ref 135–145)

## 2020-01-24 LAB — CBC
HCT: 27.2 % — ABNORMAL LOW (ref 39.0–52.0)
HCT: 27.5 % — ABNORMAL LOW (ref 39.0–52.0)
Hemoglobin: 9.1 g/dL — ABNORMAL LOW (ref 13.0–17.0)
Hemoglobin: 9.2 g/dL — ABNORMAL LOW (ref 13.0–17.0)
MCH: 32 pg (ref 26.0–34.0)
MCH: 31.9 pg (ref 26.0–34.0)
MCHC: 33.5 g/dL (ref 30.0–36.0)
MCHC: 33.5 g/dL (ref 30.0–36.0)
MCV: 95.8 fL (ref 80.0–100.0)
MCV: 95.5 fL (ref 80.0–100.0)
Platelets: 119 10*3/uL — ABNORMAL LOW (ref 150–400)
Platelets: 120 10*3/uL — ABNORMAL LOW (ref 150–400)
RBC: 2.84 MIL/uL — ABNORMAL LOW (ref 4.22–5.81)
RBC: 2.88 MIL/uL — ABNORMAL LOW (ref 4.22–5.81)
RDW: 14.3 % (ref 11.5–15.5)
RDW: 14.5 % (ref 11.5–15.5)
WBC: 7.9 10*3/uL (ref 4.0–10.5)
WBC: 8.7 10*3/uL (ref 4.0–10.5)
nRBC: 0 % (ref 0.0–0.2)
nRBC: 0 % (ref 0.0–0.2)

## 2020-01-24 LAB — GLUCOSE, CAPILLARY
Glucose-Capillary: 101 mg/dL — ABNORMAL HIGH (ref 70–99)
Glucose-Capillary: 121 mg/dL — ABNORMAL HIGH (ref 70–99)
Glucose-Capillary: 128 mg/dL — ABNORMAL HIGH (ref 70–99)
Glucose-Capillary: 132 mg/dL — ABNORMAL HIGH (ref 70–99)
Glucose-Capillary: 136 mg/dL — ABNORMAL HIGH (ref 70–99)
Glucose-Capillary: 139 mg/dL — ABNORMAL HIGH (ref 70–99)
Glucose-Capillary: 145 mg/dL — ABNORMAL HIGH (ref 70–99)
Glucose-Capillary: 190 mg/dL — ABNORMAL HIGH (ref 70–99)
Glucose-Capillary: 204 mg/dL — ABNORMAL HIGH (ref 70–99)
Glucose-Capillary: 224 mg/dL — ABNORMAL HIGH (ref 70–99)

## 2020-01-24 LAB — MAGNESIUM
Magnesium: 2.6 mg/dL — ABNORMAL HIGH (ref 1.7–2.4)
Magnesium: 2.6 mg/dL — ABNORMAL HIGH (ref 1.7–2.4)

## 2020-01-24 MED ORDER — PROTAMINE SULFATE 10 MG/ML IV SOLN
INTRAVENOUS | Status: AC
  Filled 2020-01-24: qty 25

## 2020-01-24 MED ORDER — PHENYLEPHRINE 40 MCG/ML (10ML) SYRINGE FOR IV PUSH (FOR BLOOD PRESSURE SUPPORT)
PREFILLED_SYRINGE | INTRAVENOUS | Status: AC
  Filled 2020-01-24: qty 10

## 2020-01-24 MED ORDER — INSULIN ASPART 100 UNIT/ML ~~LOC~~ SOLN
0.0000 [IU] | SUBCUTANEOUS | Status: DC
  Administered 2020-01-24 (×2): 8 [IU] via SUBCUTANEOUS
  Administered 2020-01-24: 4 [IU] via SUBCUTANEOUS
  Administered 2020-01-25: 2 [IU] via SUBCUTANEOUS
  Administered 2020-01-25: 4 [IU] via SUBCUTANEOUS
  Administered 2020-01-25 (×2): 2 [IU] via SUBCUTANEOUS
  Administered 2020-01-26 (×2): 4 [IU] via SUBCUTANEOUS

## 2020-01-24 MED ORDER — HEPARIN SODIUM (PORCINE) 1000 UNIT/ML IJ SOLN
INTRAMUSCULAR | Status: AC
  Filled 2020-01-24: qty 1

## 2020-01-24 MED ORDER — ALBUMIN HUMAN 5 % IV SOLN
25.0000 g | Freq: Once | INTRAVENOUS | Status: AC
  Administered 2020-01-24: 25 g via INTRAVENOUS
  Filled 2020-01-24: qty 500

## 2020-01-24 MED ORDER — FERROUS GLUCONATE 324 (38 FE) MG PO TABS
324.0000 mg | ORAL_TABLET | Freq: Every day | ORAL | Status: DC
  Administered 2020-01-25 – 2020-02-06 (×13): 324 mg via ORAL
  Filled 2020-01-24 (×13): qty 1

## 2020-01-24 MED ORDER — AMIODARONE HCL IN DEXTROSE 360-4.14 MG/200ML-% IV SOLN
60.0000 mg/h | INTRAVENOUS | Status: DC
  Administered 2020-01-24 (×2): 60 mg/h via INTRAVENOUS

## 2020-01-24 MED ORDER — PROTAMINE SULFATE 10 MG/ML IV SOLN
INTRAVENOUS | Status: AC
  Filled 2020-01-24: qty 5

## 2020-01-24 MED ORDER — AMIODARONE LOAD VIA INFUSION
150.0000 mg | Freq: Once | INTRAVENOUS | Status: AC
  Administered 2020-01-24: 150 mg via INTRAVENOUS

## 2020-01-24 MED ORDER — LIDOCAINE 2% (20 MG/ML) 5 ML SYRINGE
INTRAMUSCULAR | Status: AC
  Filled 2020-01-24: qty 5

## 2020-01-24 MED ORDER — POTASSIUM CHLORIDE 10 MEQ/50ML IV SOLN
10.0000 meq | INTRAVENOUS | Status: AC
  Administered 2020-01-24 (×3): 10 meq via INTRAVENOUS

## 2020-01-24 MED ORDER — AMIODARONE HCL IN DEXTROSE 360-4.14 MG/200ML-% IV SOLN
INTRAVENOUS | Status: AC
  Filled 2020-01-24: qty 200

## 2020-01-24 MED ORDER — AMIODARONE HCL IN DEXTROSE 360-4.14 MG/200ML-% IV SOLN
30.0000 mg/h | INTRAVENOUS | Status: DC
  Administered 2020-01-25 – 2020-01-28 (×7): 30 mg/h via INTRAVENOUS
  Filled 2020-01-24 (×8): qty 200

## 2020-01-24 MED FILL — Potassium Chloride Inj 2 mEq/ML: INTRAVENOUS | Qty: 40 | Status: AC

## 2020-01-24 MED FILL — Sodium Chloride IV Soln 0.9%: INTRAVENOUS | Qty: 2000 | Status: AC

## 2020-01-24 MED FILL — Heparin Sodium (Porcine) Inj 1000 Unit/ML: INTRAMUSCULAR | Qty: 30 | Status: AC

## 2020-01-24 MED FILL — Electrolyte-R (PH 7.4) Solution: INTRAVENOUS | Qty: 4000 | Status: AC

## 2020-01-24 MED FILL — Heparin Sodium (Porcine) Inj 1000 Unit/ML: INTRAMUSCULAR | Qty: 10 | Status: AC

## 2020-01-24 MED FILL — Calcium Chloride Inj 10%: INTRAVENOUS | Qty: 10 | Status: AC

## 2020-01-24 MED FILL — Magnesium Sulfate Inj 50%: INTRAMUSCULAR | Qty: 10 | Status: AC

## 2020-01-24 MED FILL — Mannitol IV Soln 20%: INTRAVENOUS | Qty: 500 | Status: AC

## 2020-01-24 NOTE — Progress Notes (Addendum)
[facility] DAILY ICU PROGRESS NOTE                   [address]            [place] [address]          [id number]   1 Day Post-Op Procedure(s) (LRB): CORONARY ARTERY BYPASS GRAFTING (CABG), ON PUMP, TIMES FOUR, USING LEFT INTERNAL MAMMARY ARTERY AND ENDOSCOPICALLY HARVESTED LEFT GREATER SAPHENOUS VEIN (N/A) TRANSESOPHAGEAL ECHOCARDIOGRAM (TEE) (N/A)  Total Length of Stay:  LOS: 3 days   Subjective: Patient awake, alert this am. He is asking for water.  Objective: Vital signs in last 24 hours: Temp:  [97.9 F (36.6 C)-98.6 F (37 C)] 98.1 F (36.7 C) (07/29 0700) Pulse Rate:  [96-119] 98 (07/29 0700) Cardiac Rhythm: Sinus tachycardia (07/28 2000) Resp:  [12-25] 14 (07/29 0700) BP: (80-117)/(54-84) 97/66 (07/29 0700) SpO2:  [95 %-100 %] 100 % (07/29 0700) Arterial Line BP: (86-127)/(53-77) 111/53 (07/29 0700) FiO2 (%):  [40 %-50 %] 40 % (07/28 2041) Weight:  [82.3 kg] 82.3 kg (07/29 0500)  Filed Weights   01/21/20 0600 01/22/20 0500 01/24/20 0500  Weight: 81.3 kg 82.3 kg 82.3 kg    Weight change:    Hemodynamic parameters for last 24 hours: PAP: (20-40)/(10-27) 30/16 CO:  [3.1 L/min-5.1 L/min] 4.5 L/min CI:  [1.6 L/min/m2-2.6 L/min/m2] 2.3 L/min/m2  Intake/Output from previous day: 07/28 0701 - 07/29 0700 In: 5550.8 [I.V.:3921.7; Blood:580; IV Piggyback:1049.2] Out: 7412 [Urine:2245; Blood:870; Chest Tube:700]  Intake/Output this shift: No intake/output data recorded.  Current Meds: Scheduled Meds: . acetaminophen  1,000 mg Oral Q6H   Or  . acetaminophen (TYLENOL) oral liquid 160 mg/5 mL  1,000 mg Per Tube Q6H  . apixaban  5 mg Oral BID  . aspirin EC  81 mg Oral Daily   Or  . aspirin  81 mg Per Tube Daily  . atorvastatin  80 mg Oral Daily  . bisacodyl  10 mg Oral Daily   Or  . bisacodyl  10 mg Rectal Daily  . Chlorhexidine Gluconate Cloth  6 each Topical Daily  . docusate sodium  200 mg Oral Daily  . melatonin  3 mg Oral QHS  .  metoprolol tartrate  12.5 mg Oral BID   Or  . metoprolol tartrate  12.5 mg Per Tube BID  . multivitamin  1 tablet Oral BID  . [START ON 01/25/2020] pantoprazole  40 mg Oral Daily  . sodium chloride flush  10-40 mL Intracatheter Q12H  . sodium chloride flush  3 mL Intravenous Q12H   Continuous Infusions: . sodium chloride Stopped (01/24/20 0544)  . sodium chloride    . sodium chloride 20 mL/hr at 01/23/20 1355  . cefUROXime (ZINACEF)  IV Stopped (01/24/20 0451)  . dexmedetomidine (PRECEDEX) IV infusion Stopped (01/23/20 1538)  . DOBUTamine 9.964 mcg/kg/min (01/23/20 2150)  . epinephrine 7 mcg/min (01/23/20 2024)  . insulin 1.1 mL/hr at 01/24/20 0600  . lactated ringers    . lactated ringers    . lactated ringers 20 mL/hr at 01/23/20 1331  . milrinone Stopped (01/23/20 1354)  . nitroGLYCERIN 0 mcg/min (01/23/20 1336)  . norepinephrine (LEVOPHED) Adult infusion 2 mcg/min (01/23/20 1900)  . phenylephrine (NEO-SYNEPHRINE) Adult infusion 75 mcg/min (01/23/20 1338)  . potassium chloride 10 mEq (01/24/20 0653)  . vasopressin 0.04 Units/min (01/24/20 0335)   PRN Meds:.sodium chloride, dextrose, DULoxetine, ipratropium, lactated ringers, levalbuterol, lip balm, metoprolol tartrate, midazolam, morphine injection, ondansetron (ZOFRAN) IV, oxyCODONE, sodium chloride  flush, sodium chloride flush, traMADol  General appearance: alert, cooperative and no distress Neurologic: intact Heart: RRR Lungs: Diminshed bibasilar breath sounds Abdomen: Soft, mild tenderness left side, bowel sounds present Extremities: SCDs in place Wound: All dressings dry and intact  Lab Results: CBC: Recent Labs    01/23/20 1859 01/23/20 2141 01/23/20 2244 01/24/20 0407  WBC 11.6*  --   --  7.9  HGB 10.1*   < > 9.2* 9.1*  HCT 30.7*   < > 27.0* 27.2*  PLT 125*  --   --  119*   < > = values in this interval not displayed.   BMET:  Recent Labs    01/23/20 1859 01/23/20 2141 01/23/20 2244 01/24/20 0407  NA  138   < > 138 137  K 3.9   < > 5.1 3.7  CL 106  --   --  104  CO2 22  --   --  22  GLUCOSE 150*  --   --  139*  BUN 47*  --   --  45*  CREATININE 1.46*  --   --  1.45*  CALCIUM 8.0*  --   --  7.9*   < > = values in this interval not displayed.    CMET: Lab Results  Component Value Date   WBC 7.9 01/24/2020   HGB 9.1 (L) 01/24/2020   HCT 27.2 (L) 01/24/2020   PLT 119 (L) 01/24/2020   GLUCOSE 139 (H) 01/24/2020   CHOL 192 01/22/2020   TRIG 61 01/22/2020   HDL 68 01/22/2020   LDLCALC 112 (H) 01/22/2020   ALT 41 01/23/2020   AST 64 (H) 01/23/2020   NA 137 01/24/2020   K 3.7 01/24/2020   CL 104 01/24/2020   CREATININE 1.45 (H) 01/24/2020   BUN 45 (H) 01/24/2020   CO2 22 01/24/2020   INR 1.6 (H) 01/23/2020   HGBA1C 6.0 (H) 01/21/2020      PT/INR:  Recent Labs    01/23/20 1332  LABPROT 18.2*  INR 1.6*   Radiology: [facility] Chest Port 1 View  Result Date: 01/24/2020 CLINICAL DATA:  Open-heart surgery.  Chest tube.  Sore chest. EXAM: PORTABLE CHEST 1 VIEW COMPARISON:  01/23/2020. FINDINGS: Interim extubation and removal of NG tube. Swan-Ganz catheter and mediastinal drainage catheters in stable position. Prior CABG. Stable cardiomegaly. Low lung volumes with mild bilateral subsegmental atelectasis. No pleural effusion or pneumothorax. IMPRESSION: 1. Interim extubation removal of NG tube. Swan-Ganz catheter and mediastinal drainage catheters in stable position. 2.  Prior CABG.  Stable cardiomegaly. 3.  Low lung volumes with mild bilateral subsegmental atelectasis. Electronically Signed   By: Marcello Moores  Register   On: 01/24/2020 07:00   DG Chest Port 1 View  Result Date: 01/23/2020 CLINICAL DATA:  Status post coronary bypass graft. EXAM: PORTABLE CHEST 1 VIEW COMPARISON:  Same day. FINDINGS: Stable cardiomegaly. Endotracheal and nasogastric tubes are in grossly good position. Right internal jugular Swan-Ganz catheter is noted with distal tip in expected position of main pulmonary artery.  No pneumothorax or significant pleural effusion is noted. Lungs are clear. Bony thorax is unremarkable. IMPRESSION: Endotracheal and nasogastric tubes in grossly good position. No pneumothorax is noted. No acute cardiopulmonary abnormality seen. Electronically Signed   By: Marijo Conception M.D.   On: 01/23/2020 13:54     Assessment/Plan: S/P Procedure(s) (LRB): CORONARY ARTERY BYPASS GRAFTING (CABG), ON PUMP, TIMES FOUR, USING LEFT INTERNAL MAMMARY ARTERY AND ENDOSCOPICALLY HARVESTED LEFT GREATER SAPHENOUS VEIN (N/A) TRANSESOPHAGEAL ECHOCARDIOGRAM (TEE) (  N/A) 1. CV-CO/CI 4.5/2.3. He is on multiple drips: Dobutamine, Epinephrine, Levophed, Neo Synephrine, and Vasopressin drips. 2. Pulmonary-Chest tubes with 700 cc since surgery. CXR this am shows low lung volumes, bibasilar atelectasis,cardiomegaly, and no pneumothorax. Encourage incentive spirometer and flutter valve. 3. Volume overload-once off pressors and BP improved, will diurese 4. Expected post op blood loss anemia-H and H this am decreased to 9.1 and 27.2. Will start oral ferrous in am 5. CBGs 139/132/136. Wean off Insulin drip. Pre op HGA1C 6. Will stop accu checks and SS PRN on transfer 6. Supplement potassium 7. Creatinine this am stable at 1.45. Creatinine upon admission was 1.8 8. 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  Wilhemina Grall Bary Leriche

## 2020-01-24 NOTE — Progress Notes (Signed)
Patient ID: Dariusz Brase, male   DOB: 04-03-1931, 86 y.o.   MRN: [id number] TCTS Evening Rounds:  Hemodynamically stable in AFib with controlled rate 85. amio started today.  sats 94%  Urine output ok  CT output low.  CBC    Component Value Date/Time   WBC 8.7 01/24/2020 1620   RBC 2.88 (L) 01/24/2020 1620   HGB 9.2 (L) 01/24/2020 1620   HCT 27.5 (L) 01/24/2020 1620   PLT 120 (L) 01/24/2020 1620   MCV 95.5 01/24/2020 1620   MCH 31.9 01/24/2020 1620   MCHC 33.5 01/24/2020 1620   RDW 14.5 01/24/2020 1620   BMET pending this pm.

## 2020-01-25 ENCOUNTER — Encounter (HOSPITAL_COMMUNITY): Payer: Self-pay | Admitting: Cardiology

## 2020-01-25 ENCOUNTER — Inpatient Hospital Stay (HOSPITAL_COMMUNITY): Payer: Medicare Other

## 2020-01-25 DIAGNOSIS — I25110 Atherosclerotic heart disease of native coronary artery with unstable angina pectoris: Secondary | ICD-10-CM | POA: Diagnosis not present

## 2020-01-25 DIAGNOSIS — I480 Paroxysmal atrial fibrillation: Secondary | ICD-10-CM | POA: Diagnosis not present

## 2020-01-25 DIAGNOSIS — I214 Non-ST elevation (NSTEMI) myocardial infarction: Secondary | ICD-10-CM | POA: Diagnosis not present

## 2020-01-25 LAB — CBC
HCT: 25.1 % — ABNORMAL LOW (ref 39.0–52.0)
Hemoglobin: 8.3 g/dL — ABNORMAL LOW (ref 13.0–17.0)
MCH: 31.7 pg (ref 26.0–34.0)
MCHC: 33.1 g/dL (ref 30.0–36.0)
MCV: 95.8 fL (ref 80.0–100.0)
Platelets: 120 10*3/uL — ABNORMAL LOW (ref 150–400)
RBC: 2.62 MIL/uL — ABNORMAL LOW (ref 4.22–5.81)
RDW: 14.6 % (ref 11.5–15.5)
WBC: 8 10*3/uL (ref 4.0–10.5)
nRBC: 0 % (ref 0.0–0.2)

## 2020-01-25 LAB — BASIC METABOLIC PANEL
Anion gap: 11 (ref 5–15)
Anion gap: 11 (ref 5–15)
BUN: 49 mg/dL — ABNORMAL HIGH (ref 8–23)
BUN: 51 mg/dL — ABNORMAL HIGH (ref 8–23)
CO2: 19 mmol/L — ABNORMAL LOW (ref 22–32)
CO2: 17 mmol/L — ABNORMAL LOW (ref 22–32)
Calcium: 7.9 mg/dL — ABNORMAL LOW (ref 8.9–10.3)
Calcium: 7.9 mg/dL — ABNORMAL LOW (ref 8.9–10.3)
Chloride: 104 mmol/L (ref 98–111)
Chloride: 103 mmol/L (ref 98–111)
Creatinine, Ser: 1.58 mg/dL — ABNORMAL HIGH (ref 0.61–1.24)
Creatinine, Ser: 1.74 mg/dL — ABNORMAL HIGH (ref 0.61–1.24)
GFR calc Af Amer: 45 mL/min — ABNORMAL LOW (ref >60)
GFR calc Af Amer: 40 mL/min — ABNORMAL LOW (ref >60)
GFR calc non Af Amer: 39 mL/min — ABNORMAL LOW (ref >60)
GFR calc non Af Amer: 34 mL/min — ABNORMAL LOW (ref >60)
Glucose, Bld: 155 mg/dL — ABNORMAL HIGH (ref 70–99)
Glucose, Bld: 124 mg/dL — ABNORMAL HIGH (ref 70–99)
Potassium: 4 mmol/L (ref 3.5–5.1)
Potassium: 4.1 mmol/L (ref 3.5–5.1)
Sodium: 134 mmol/L — ABNORMAL LOW (ref 135–145)
Sodium: 131 mmol/L — ABNORMAL LOW (ref 135–145)

## 2020-01-25 LAB — GLUCOSE, CAPILLARY
Glucose-Capillary: 104 mg/dL — ABNORMAL HIGH (ref 70–99)
Glucose-Capillary: 105 mg/dL — ABNORMAL HIGH (ref 70–99)
Glucose-Capillary: 121 mg/dL — ABNORMAL HIGH (ref 70–99)
Glucose-Capillary: 125 mg/dL — ABNORMAL HIGH (ref 70–99)
Glucose-Capillary: 133 mg/dL — ABNORMAL HIGH (ref 70–99)
Glucose-Capillary: 164 mg/dL — ABNORMAL HIGH (ref 70–99)

## 2020-01-25 LAB — PREPARE RBC (CROSSMATCH)

## 2020-01-25 MED ORDER — SODIUM CHLORIDE 0.9% IV SOLUTION: Freq: Once | INTRAVENOUS | Status: AC

## 2020-01-25 MED ORDER — MIDODRINE HCL 5 MG PO TABS
10.0000 mg | ORAL_TABLET | Freq: Three times a day (TID) | ORAL | Status: DC
  Administered 2020-01-25 – 2020-02-03 (×27): 10 mg via ORAL
  Filled 2020-01-25 (×24): qty 2

## 2020-01-25 MED ORDER — FUROSEMIDE 10 MG/ML IJ SOLN
40.0000 mg | Freq: Once | INTRAMUSCULAR | Status: AC
  Administered 2020-01-25: 40 mg via INTRAVENOUS
  Filled 2020-01-25: qty 4

## 2020-01-25 MED FILL — Sodium Chloride IV Soln 0.9%: INTRAVENOUS | Qty: 100 | Status: AC

## 2020-01-25 MED FILL — Vasopressin IV Soln 20 Unit/ML (For IV Infusion): INTRAVENOUS | Qty: 1 | Status: AC

## 2020-01-25 NOTE — Evaluation (Signed)
Physical Therapy Evaluation Patient Details Name: Bryan Dunn MRN: [id number] DOB: 1930/10/19 Today's Date: 01/25/2020   History of Present Illness  Bryan Dunn is a 86 y.o. male with a hx of very remote CAD with PCI over 20 years ago in [place], HTN, HLD and neuropathy who presented to [facility] with complaints of chest pain, was positive for NSTEMI and cath demonstrated 3 vessel calcification, now s/p CABG x 4 on 01/23/20.  Clinical Impression  Patient presents with decreased mobility due to decreased strength, decreased balance, decreased knowledge of precautions today needing mod to max A with +2 for safety for up to recliner.  Still on pressors, but pt eager to try ambulation.  Feel he should progress with more practice and be able to get home with family support.  He is from [place], but can stay where with son as needed.  PT to follow acutely.     Follow Up Recommendations Home health PT    Equipment Recommendations  None recommended by PT    Recommendations for Other Services       Precautions / Restrictions Precautions Precautions: Sternal;Fall Precaution Comments: reveiwed precautions with pt he remembered teaching from previous education      Mobility  Bed Mobility Overal bed mobility: Needs Assistance Bed Mobility: Rolling;Sidelying to Sit Rolling: Mod assist;+2 for safety/equipment Sidelying to sit: Mod assist;+2 for safety/equipment       General bed mobility comments: educated on technique, assist to turn hips and to lift trunk  Transfers Overall transfer level: Needs assistance Equipment used: None Transfers: Sit to/from [facility] Sit to Stand: Mod assist;Max assist Stand pivot transfers: Mod assist       General transfer comment: lifting help to stand, then returned to sitting EOB due to LOB, assisted again to stand and step to recliner  Ambulation/Gait             General Gait Details: NT, still on pressor per RN  Stairs             Wheelchair Mobility    Modified Rankin (Stroke Patients Only)       Balance Overall balance assessment: Needs assistance Sitting-balance support: Feet supported Sitting balance-Leahy Scale: Fair Sitting balance - Comments: EOB wtih S   Standing balance support: Bilateral upper extremity supported Standing balance-Leahy Scale: Poor                               Pertinent Vitals/Pain Pain Assessment: 0-10 Pain Score: 4  Pain Location: chest Pain Descriptors / Indicators: Sore Pain Intervention(s): Monitored during session;Repositioned;Other (comment) (encourged use of heart pillow)    Home Living Family/patient expects to be discharged to:: Private residence Living Arrangements: Spouse/significant other Available Help at Discharge: Family Type of Home: House Home Access: Stairs to enter Entrance Stairs-Rails: Psychiatric nurse of Steps: 3 Home Layout: One level Home Equipment: Shower seat - built in      Prior Function Level of Independence: Independent         Comments: retired, living in [place] with his wife, was on the way to [place] to visit another son, but stopped here to visit different son; nephew is CTS in [place] he was planning to see.     Hand Dominance   Dominant Hand: Left    Extremity/Trunk Assessment   Upper Extremity Assessment Upper Extremity Assessment: Generalized weakness    Lower Extremity Assessment Lower Extremity Assessment: Generalized weakness  Communication   Communication: No difficulties  Cognition Arousal/Alertness: Awake/alert Behavior During Therapy: WFL for tasks assessed/performed Overall Cognitive Status: Within Functional Limits for tasks assessed                                        General Comments General comments (skin integrity, edema, etc.): VSS with stand pivot to recliner, on multiple drips due to a-fib    Exercises     Assessment/Plan     PT Assessment Patient needs continued PT services  PT Problem List Decreased strength;Decreased mobility;Decreased knowledge of precautions;Cardiopulmonary status limiting activity;Decreased balance;Decreased activity tolerance;Decreased knowledge of use of DME       PT Treatment Interventions DME instruction;Therapeutic activities;Gait training;Therapeutic exercise;Patient/family education;Stair training;Balance training;Functional mobility training    PT Goals (Current goals can be found in the Care Plan section)  Acute Rehab PT Goals Patient Stated Goal: to get back home PT Goal Formulation: With patient Time For Goal Achievement: 02/08/20 Potential to Achieve Goals: Good    Frequency Min 3X/week   Barriers to discharge        Co-evaluation               AM-PAC PT "6 Clicks" Mobility  Outcome Measure Help needed turning from your back to your side while in a flat bed without using bedrails?: A Lot Help needed moving from lying on your back to sitting on the side of a flat bed without using bedrails?: A Lot Help needed moving to and from a bed to a chair (including a wheelchair)?: Total Help needed standing up from a chair using your arms (e.g., wheelchair or bedside chair)?: A Lot Help needed to walk in hospital room?: Total Help needed climbing 3-5 steps with a railing? : Total 6 Click Score: 9    End of Session   Activity Tolerance: Patient limited by fatigue Patient left: in chair;with call bell/phone within reach Nurse Communication: Mobility status PT Visit Diagnosis: Other abnormalities of gait and mobility (R26.89);Difficulty in walking, not elsewhere classified (R26.2);Muscle weakness (generalized) (M62.81)    Time: 9211-9417 PT Time Calculation (min) (ACUTE ONLY): 23 min   Charges:   PT Evaluation $PT Eval Moderate Complexity: 1 Mod PT Treatments $Therapeutic Activity: 8-22 mins        Magda Kiel, PT Acute Rehabilitation  Services EYCXK:[phone] [phone] 01/25/2020   Reginia Naas 01/25/2020, 1:27 PM

## 2020-01-25 NOTE — Progress Notes (Signed)
Chaplain engaged in follow-up visit with Bryan Dunn, meeting his two sons at bedside.  Chaplain continued to offer support.  Bryan Dunn expressed that he is not from the [place] area, specifically coming from the [place] and [place] area, but that a chaplain was able to arrange for a priest to visit him.  Bryan Dunn also expressed his recovery journey ahead.   Chaplain will continue to follow-up.

## 2020-01-25 NOTE — Discharge Instructions (Signed)
Discharge Instructions:  1. You may shower, please wash incisions daily with soap and water and keep dry.  If you wish to cover wounds with dressing you may do so but please keep clean and change daily.  No tub baths or swimming until incisions have completely healed.  If your incisions become red or develop any drainage please call our office at [phone]  2. No Driving until cleared by Dr. Abran Duke office and you are no longer using narcotic pain medications  3. Monitor your weight daily.. Please use the same scale and weigh at same time... If you gain 5-10 lbs in 48 hours with associated lower extremity swelling, please contact our office at [phone]  4. Fever of 101.5 for at least 24 hours with no source, please contact our office at [phone]  5. Activity- up as tolerated, please walk at least 3 times per day.  Avoid strenuous activity, no lifting, pushing, or pulling with your arms over 8-10 lbs for a minimum of 6 weeks  6. If any questions or concerns arise, please do not hesitate to contact our office at [phone]  ------------------------------------------- Information on my medicine - ELIQUIS (apixaban)  Why was Eliquis prescribed for you? Eliquis was prescribed for you to reduce the risk of a blood clot forming that can cause a stroke if you have a medical condition called atrial fibrillation (a type of irregular heartbeat).  What do You need to know about Eliquis ? Take your Eliquis TWICE DAILY - one tablet in the morning and one tablet in the evening with or without food. If you have difficulty swallowing the tablet whole please discuss with your pharmacist how to take the medication safely.  Take Eliquis exactly as prescribed by your doctor and DO NOT stop taking Eliquis without talking to the doctor who prescribed the medication.  Stopping may increase your risk of developing a stroke.  Refill your prescription before you run out.  After discharge, you  should have regular check-up appointments with your healthcare provider that is prescribing your Eliquis.  In the future your dose may need to be changed if your kidney function or weight changes by a significant amount or as you get older.  What do you do if you miss a dose? If you miss a dose, take it as soon as you remember on the same day and resume taking twice daily.  Do not take more than one dose of ELIQUIS at the same time to make up a missed dose.  Important Safety Information A possible side effect of Eliquis is bleeding. You should call your healthcare provider right away if you experience any of the following: ? Bleeding from an injury or your nose that does not stop. ? Unusual colored urine (red or dark brown) or unusual colored stools (red or black). ? Unusual bruising for unknown reasons. ? A serious fall or if you hit your head (even if there is no bleeding).  Some medicines may interact with Eliquis and might increase your risk of bleeding or clotting while on Eliquis. To help avoid this, consult your healthcare provider or pharmacist prior to using any new prescription or non-prescription medications, including herbals, vitamins, non-steroidal anti-inflammatory drugs (NSAIDs) and supplements.  This website has more information on Eliquis (apixaban): http://www.eliquis.com/eliquis/home

## 2020-01-25 NOTE — Progress Notes (Signed)
Pt had low urine output on night shift. Bladder scan twice and got zero and 3 ml. Received an order for 500 mL of albumin, 1 unit of blood and 40 mg of lasix after blood was finished.  Levo was titrated down to 2 since blood was transfused.  Will continue to monitor.

## 2020-01-25 NOTE — Progress Notes (Addendum)
TCTS DAILY ICU PROGRESS NOTE                    [address]            [place], [address]          [phone]   2 Days Post-Op Procedure(s) (LRB): CORONARY ARTERY BYPASS GRAFTING (CABG), ON PUMP, TIMES FOUR, USING LEFT INTERNAL MAMMARY ARTERY AND ENDOSCOPICALLY HARVESTED LEFT GREATER SAPHENOUS VEIN (N/A) TRANSESOPHAGEAL ECHOCARDIOGRAM (TEE) (N/A)  Total Length of Stay:  LOS: 4 days   Subjective: Events last night noted-decreased UO (given Albumin, Lasix), anemia (given PRBCs) Patient are some breakfast this am. He denies abdominal pain, nausea. He has no other complaints this am.  Objective: Vital signs in last 24 hours: Temp:  [94.4 F (34.7 C)-98.4 F (36.9 C)] 94.4 F (34.7 C) (07/30 0704) Pulse Rate:  [53-109] 71 (07/30 0704) Cardiac Rhythm: Atrial fibrillation (07/29 2000) Resp:  [8-30] 23 (07/30 0704) BP: (80-127)/(51-77) 120/68 (07/30 0704) SpO2:  [88 %-100 %] 97 % (07/30 0704) Arterial Line BP: (82-127)/(50-72) 120/68 (07/30 0704) Weight:  [86.8 kg] 86.8 kg (07/30 0500)  Filed Weights   01/22/20 0500 01/24/20 0500 01/25/20 0500  Weight: 82.3 kg 82.3 kg 86.8 kg    Weight change: 4.5 kg   Hemodynamic parameters for last 24 hours: PAP: (32-44)/(17-20) 44/20 CO:  [4.6 L/min] 4.6 L/min CI:  [2.3 L/min/m2] 2.3 L/min/m2  Intake/Output from previous day: 07/29 0701 - 07/30 0700 In: 2590.4 [I.V.:2253.5; IV Piggyback:336.9] Out: 1050 [Urine:650; Chest Tube:400]  Intake/Output this shift: Total I/O In: 315 [Blood:315] Out: -   Current Meds: Scheduled Meds: . acetaminophen  1,000 mg Oral Q6H   Or  . acetaminophen (TYLENOL) oral liquid 160 mg/5 mL  1,000 mg Per Tube Q6H  . apixaban  5 mg Oral BID  . aspirin EC  81 mg Oral Daily   Or  . aspirin  81 mg Per Tube Daily  . atorvastatin  80 mg Oral Daily  . bisacodyl  10 mg Oral Daily   Or  . bisacodyl  10 mg Rectal Daily  . Chlorhexidine Gluconate Cloth  6 each Topical Daily  . docusate  sodium  200 mg Oral Daily  . ferrous gluconate  324 mg Oral Q breakfast  . insulin aspart  0-24 Units Subcutaneous Q4H  . melatonin  3 mg Oral QHS  . metoprolol tartrate  12.5 mg Oral BID   Or  . metoprolol tartrate  12.5 mg Per Tube BID  . multivitamin  1 tablet Oral BID  . pantoprazole  40 mg Oral Daily  . sodium chloride flush  10-40 mL Intracatheter Q12H  . sodium chloride flush  3 mL Intravenous Q12H   Continuous Infusions: . sodium chloride Stopped (01/24/20 1045)  . sodium chloride    . sodium chloride 20 mL/hr at 01/23/20 1355  . amiodarone 30 mg/hr (01/25/20 0006)  . dexmedetomidine (PRECEDEX) IV infusion Stopped (01/23/20 1538)  . DOBUTamine 5 mcg/kg/min (01/24/20 2300)  . epinephrine Stopped (01/24/20 0930)  . insulin Stopped (01/24/20 1017)  . lactated ringers    . lactated ringers    . lactated ringers 20 mL/hr at 01/23/20 1331  . milrinone Stopped (01/23/20 1354)  . nitroGLYCERIN 0 mcg/min (01/23/20 1336)  . norepinephrine (LEVOPHED) Adult infusion 6 mcg/min (01/24/20 2220)  . phenylephrine (NEO-SYNEPHRINE) Adult infusion 75 mcg/min (01/23/20 1338)  . vasopressin 0.04 Units/min (01/25/20 0558)   PRN Meds:.sodium chloride, dextrose, DULoxetine, ipratropium, lactated ringers,  levalbuterol, lip balm, metoprolol tartrate, midazolam, morphine injection, ondansetron (ZOFRAN) IV, oxyCODONE, sodium chloride flush, sodium chloride flush, traMADol  General appearance: alert, cooperative and no distress Neurologic: intact Heart: IRRR IRRR Lungs: Diminshed bibasilar breath sounds Abdomen: Soft, non tender, bowel sounds present Extremities: LE edema;hands and face swollen as well Wound: Sternal dressing removed and wound is clean and dry. Bilateral LEs wounds are mostly clean and dry  Lab Results: CBC: Recent Labs    01/24/20 1620 01/25/20 0240  WBC 8.7 8.0  HGB 9.2* 8.3*  HCT 27.5* 25.1*  PLT 120* 120*   BMET:  Recent Labs    01/24/20 1620 01/25/20 0240  NA  134* 134*  K 4.2 4.0  CL 104 104  CO2 16* 19*  GLUCOSE 218* 155*  BUN 45* 49*  CREATININE 1.57* 1.58*  CALCIUM 7.9* 7.9*    CMET: Lab Results  Component Value Date   WBC 8.0 01/25/2020   HGB 8.3 (L) 01/25/2020   HCT 25.1 (L) 01/25/2020   PLT 120 (L) 01/25/2020   GLUCOSE 155 (H) 01/25/2020   CHOL 192 01/22/2020   TRIG 61 01/22/2020   HDL 68 01/22/2020   LDLCALC 112 (H) 01/22/2020   ALT 41 01/23/2020   AST 64 (H) 01/23/2020   NA 134 (L) 01/25/2020   K 4.0 01/25/2020   CL 104 01/25/2020   CREATININE 1.58 (H) 01/25/2020   BUN 49 (H) 01/25/2020   CO2 19 (L) 01/25/2020   INR 1.6 (H) 01/23/2020   HGBA1C 6.0 (H) 01/21/2020      PT/INR:  Recent Labs    01/23/20 1332  LABPROT 18.2*  INR 1.6*   Radiology: DG CHEST PORT 1 VIEW  Result Date: 01/25/2020 CLINICAL DATA:  Swan-Ganz catheter removal. Recent coronary artery bypass grafting EXAM: PORTABLE CHEST 1 VIEW COMPARISON:  January 24, 2020 FINDINGS: Swan-Ganz catheter has been removed. Cordis tip is near the junction of the right jugular vein and superior vena cava. Chest tube on the left remains. There are mediastinal drains, unchanged in position. No pneumothorax. There is a minimal left pleural effusion with slight bibasilar atelectasis. No edema or airspace opacity. There is stable cardiomegaly. No adenopathy. Bones are osteoporotic. IMPRESSION: And catheter positions as described without pneumothorax. Small left pleural effusion with mild bibasilar atelectasis. No consolidation. Stable cardiac prominence. Electronically Signed   By: Lowella Grip III M.D.   On: 01/25/2020 07:16     Assessment/Plan: S/P Procedure(s) (LRB): CORONARY ARTERY BYPASS GRAFTING (CABG), ON PUMP, TIMES FOUR, USING LEFT INTERNAL MAMMARY ARTERY AND ENDOSCOPICALLY HARVESTED LEFT GREATER SAPHENOUS VEIN (N/A) TRANSESOPHAGEAL ECHOCARDIOGRAM (TEE) (N/A)   1. CV-He went into a fib yesterday. He is in a fib with CVR this am. He is on multiple drips:  Dobutamine, Amiodarone, Levophed, and Vasopressin drips. Also, on  Apixaban (LAA thrombus, a fib now) and Lopressor 12.5 mg bid. Try to wean Levophed and Vasopressin. 2. Pulmonary-Chest tubes with 400 cc last 24 hours. Likely place chest tubes to bulb suction. CXR this am shows bibasilar atelectasis, small left pleural effusion, cardiomegaly, and no pneumothorax. Encourage incentive spirometer and flutter valve. 3. Volume overload-once off pressors and BP improved, will diurese 4. Expected post op blood loss anemia-H and H this am decreased to 8.3 and 25.1. He was given PRBC last night. Will start oral ferrous 5. CBGs 204/121/104. Wean off Insulin drip. Pre op HGA1C 6. Will stop accu checks and SS PRN on transfer 6. Creatinine this am slightly increased to 1.58 Creatinine upon admission was  1.8 7. Mild thrombocytopenia-platelets this am 120,000 8. Patient with right IJ;may need PICC as likely will still be on several drips  Donielle Liston Alba PA-C 01/25/2020 7:38 AM    Overall he is doing well. He did receive 1 unit of blood along with 500 of albumin overnight for low urine output. Chest tube output remains high, and would remove once lower than 250 cc for 24 hours. Remains on 5 of dobutamine with a goal of weaning the vasopressin today. Midodrine has been started to assist with blood pressure titration Patient is on Eliquis for left atrial appendage thrombus. Continue ICU care Continue physical therapy.  Cristina Mattern Bary Leriche

## 2020-01-25 NOTE — Progress Notes (Signed)
Progress Note  Patient Name: Bryan Dunn Date of Encounter: 01/25/2020  [facility] Cardiologist: Fransico Him, MD   Subjective   Postop day #2 CABG x4 by Dr. Kipp Brood.  On multiple pressor agents.  Otherwise clinically stable.  Denies chest pain or shortness of breath.  Had a light breakfast this morning.  Inpatient Medications    Scheduled Meds: . acetaminophen  1,000 mg Oral Q6H   Or  . acetaminophen (TYLENOL) oral liquid 160 mg/5 mL  1,000 mg Per Tube Q6H  . apixaban  5 mg Oral BID  . aspirin EC  81 mg Oral Daily   Or  . aspirin  81 mg Per Tube Daily  . atorvastatin  80 mg Oral Daily  . bisacodyl  10 mg Oral Daily   Or  . bisacodyl  10 mg Rectal Daily  . Chlorhexidine Gluconate Cloth  6 each Topical Daily  . docusate sodium  200 mg Oral Daily  . ferrous gluconate  324 mg Oral Q breakfast  . insulin aspart  0-24 Units Subcutaneous Q4H  . melatonin  3 mg Oral QHS  . metoprolol tartrate  12.5 mg Oral BID   Or  . metoprolol tartrate  12.5 mg Per Tube BID  . multivitamin  1 tablet Oral BID  . pantoprazole  40 mg Oral Daily  . sodium chloride flush  10-40 mL Intracatheter Q12H  . sodium chloride flush  3 mL Intravenous Q12H   Continuous Infusions: . sodium chloride Stopped (01/24/20 1045)  . sodium chloride    . sodium chloride 20 mL/hr at 01/23/20 1355  . amiodarone 30 mg/hr (01/25/20 0006)  . dexmedetomidine (PRECEDEX) IV infusion Stopped (01/23/20 1538)  . DOBUTamine 5 mcg/kg/min (01/24/20 2300)  . epinephrine Stopped (01/24/20 0930)  . insulin Stopped (01/24/20 1017)  . lactated ringers    . lactated ringers    . lactated ringers 20 mL/hr at 01/23/20 1331  . milrinone Stopped (01/23/20 1354)  . nitroGLYCERIN 0 mcg/min (01/23/20 1336)  . norepinephrine (LEVOPHED) Adult infusion 6 mcg/min (01/24/20 2220)  . phenylephrine (NEO-SYNEPHRINE) Adult infusion 75 mcg/min (01/23/20 1338)  . vasopressin 0.04 Units/min (01/25/20 0558)   PRN Meds: sodium chloride,  dextrose, DULoxetine, ipratropium, lactated ringers, levalbuterol, lip balm, metoprolol tartrate, midazolam, morphine injection, ondansetron (ZOFRAN) IV, oxyCODONE, sodium chloride flush, sodium chloride flush, traMADol   Vital Signs    Vitals:   01/25/20 0630 01/25/20 0645 01/25/20 0700 01/25/20 0704  BP: 99/65  (!) 85/71 120/68  Pulse: 69 70 73 71  Resp: 14 14 21 23   Temp:    (!) 94.4 F (34.7 C)  TempSrc:    Axillary  SpO2: 97% 99% 97% 97%  Weight:      Height:        Intake/Output Summary (Last 24 hours) at 01/25/2020 0820 Last data filed at 01/25/2020 0704 Gross per 24 hour  Intake 2652.79 ml  Output 990 ml  Net 1662.79 ml   Last 3 Weights 01/25/2020 01/24/2020 01/22/2020  Weight (lbs) 191 lb 5.8 oz 181 lb 7 oz 181 lb 7 oz  Weight (kg) 86.8 kg 82.3 kg 82.3 kg      Telemetry    Sinus rhythm- Personally Reviewed  ECG    None performed today- Personally Reviewed  Physical Exam   GEN: No acute distress.   Neck: No JVD Cardiac: RRR, no murmurs, rubs, or gallops.  Respiratory: Clear to auscultation bilaterally. GI: Soft, nontender, non-distended  MS: No edema; No deformity. Neuro:  Nonfocal  Psych: Normal affect  Extremities: Right radial puncture site stable  Labs    High Sensitivity Troponin:   Recent Labs  Lab 01/21/20 0325 01/21/20 0702 01/21/20 0851  TROPONINIHS 1,825* 4,392* 5,974*      Chemistry Recent Labs  Lab 01/21/20 0325 01/21/20 0325 01/22/20 0136 01/22/20 0136 01/23/20 0538 01/23/20 0823 01/24/20 0407 01/24/20 1620 01/25/20 0240  NA 137   < > 137   < > 134*   < > 137 134* 134*  K 4.0   < > 4.0   < > 4.5   < > 3.7 4.2 4.0  CL 103   < > 105   < > 101   < > 104 104 104  CO2 19*   < > 19*   < > 20*   < > 22 16* 19*  GLUCOSE 249*   < > 179*   < > 190*   < > 139* 218* 155*  BUN 45*   < > 43*   < > 53*   < > 45* 45* 49*  CREATININE 1.80*   < > 1.37*   < > 1.66*   < > 1.45* 1.57* 1.58*  CALCIUM 9.9   < > 9.1   < > 9.2   < > 7.9* 7.9* 7.9*   PROT 7.0  --  6.3*  --  6.5  --   --   --   --   ALBUMIN 3.8  --  3.3*  --  3.2*  --   --   --   --   AST 44*  --  105*  --  64*  --   --   --   --   ALT 27  --  31  --  41  --   --   --   --   ALKPHOS 81  --  70  --  82  --   --   --   --   BILITOT 1.2  --  1.4*  --  1.6*  --   --   --   --   GFRNONAA 33*   < > 46*   < > 37*   < > 43* 39* 39*  GFRAA 38*   < > 53*   < > 42*   < > 50* 45* 45*  ANIONGAP 15   < > 13   < > 13   < > 11 14 11    < > = values in this interval not displayed.     Hematology Recent Labs  Lab 01/24/20 0407 01/24/20 1620 01/25/20 0240  WBC 7.9 8.7 8.0  RBC 2.84* 2.88* 2.62*  HGB 9.1* 9.2* 8.3*  HCT 27.2* 27.5* 25.1*  MCV 95.8 95.5 95.8  MCH 32.0 31.9 31.7  MCHC 33.5 33.5 33.1  RDW 14.3 14.5 14.6  PLT 119* 120* 120*    BNP Recent Labs  Lab 01/21/20 0702  BNP 393.8*     DDimer No results for input(s): DDIMER in the last 168 hours.   Radiology    DG CHEST PORT 1 VIEW  Result Date: 01/25/2020 CLINICAL DATA:  Swan-Ganz catheter removal. Recent coronary artery bypass grafting EXAM: PORTABLE CHEST 1 VIEW COMPARISON:  January 24, 2020 FINDINGS: Swan-Ganz catheter has been removed. Cordis tip is near the junction of the right jugular vein and superior vena cava. Chest tube on the left remains. There are mediastinal drains, unchanged in position. No pneumothorax. There is a minimal left pleural effusion  with slight bibasilar atelectasis. No edema or airspace opacity. There is stable cardiomegaly. No adenopathy. Bones are osteoporotic. IMPRESSION: And catheter positions as described without pneumothorax. Small left pleural effusion with mild bibasilar atelectasis. No consolidation. Stable cardiac prominence. Electronically Signed   By: Lowella Grip III M.D.   On: 01/25/2020 07:16   DG Chest Port 1 View  Result Date: 01/24/2020 CLINICAL DATA:  Open-heart surgery.  Chest tube.  Sore chest. EXAM: PORTABLE CHEST 1 VIEW COMPARISON:  01/23/2020. FINDINGS: Interim  extubation and removal of NG tube. Swan-Ganz catheter and mediastinal drainage catheters in stable position. Prior CABG. Stable cardiomegaly. Low lung volumes with mild bilateral subsegmental atelectasis. No pleural effusion or pneumothorax. IMPRESSION: 1. Interim extubation removal of NG tube. Swan-Ganz catheter and mediastinal drainage catheters in stable position. 2.  Prior CABG.  Stable cardiomegaly. 3.  Low lung volumes with mild bilateral subsegmental atelectasis. Electronically Signed   By: Marcello Moores  Register   On: 01/24/2020 07:00   DG Chest Port 1 View  Result Date: 01/23/2020 CLINICAL DATA:  Status post coronary bypass graft. EXAM: PORTABLE CHEST 1 VIEW COMPARISON:  Same day. FINDINGS: Stable cardiomegaly. Endotracheal and nasogastric tubes are in grossly good position. Right internal jugular Swan-Ganz catheter is noted with distal tip in expected position of main pulmonary artery. No pneumothorax or significant pleural effusion is noted. Lungs are clear. Bony thorax is unremarkable. IMPRESSION: Endotracheal and nasogastric tubes in grossly good position. No pneumothorax is noted. No acute cardiopulmonary abnormality seen. Electronically Signed   By: Marijo Conception M.D.   On: 01/23/2020 13:54    Cardiac Studies   Chronic catheterization (01/21/2020)  Conclusion   Heavily calcified severe three-vessel coronary artery disease.  Decreased LVEF, 35 to 40% with anteroapical moderate hypokinesis.  LVEDP 22%.  Acute on chronic combined systolic and diastolic heart failure.  30% proximal left main  Heavily calcified 95% proximal LAD followed by total occlusion in the mid vessel.  99% ostial to proximal circumflex proximal to a previously placed stent which is patent.  2 large obtuse marginal branches are noted distally.  RCA is dominant.  There is eccentric 85% possible acute lesion in the RCA.  Segmental 75 to 90% distal RCA with severe diffuse disease in the PDA and LV branches with left to  right collaterals.  Large acute marginal branch supplies collaterals around the apex to the LAD.  RECOMMENDATIONS:   Guarded prognosis given age, LV function, and multifocal high-grade three-vessel CAD.  Needs to be evaluated to determine if he is a surgical candidate.  IV nitroglycerin  Resume IV heparin  Cycle cardiac markers  Coronary Diagrams  Diagnostic Dominance: Right  Intervention  Implants    2D echocardiogram (01/21/2020)  IMPRESSIONS    1. Left ventricular ejection fraction, by estimation, is 30 to 35%. The  left ventricle has moderately decreased function. The left ventricle  demonstrates regional wall motion abnormalities (see scoring  diagram/findings for description). Left ventricular  diastolic parameters are consistent with Grade I diastolic dysfunction  (impaired relaxation).  2. Right ventricular systolic function is normal. The right ventricular  size is normal. There is mildly elevated pulmonary artery systolic  pressure. The estimated right ventricular systolic pressure is 98.3 mmHg.  3. Left atrial size was moderately dilated.  4. The mitral valve is degenerative. Mild to moderate mitral valve  regurgitation. No evidence of mitral stenosis.  5. The aortic valve was not well visualized. Aortic valve regurgitation  is mild. Mild aortic valve sclerosis is present,  with no evidence of  aortic valve stenosis. Aortic regurgitation PHT measures 454 msec. Aortic  valve mean gradient measures 5.4 mmHg.  Aortic valve Vmax measures 1.49 m/s.  6. The inferior vena cava is normal in size with <50% respiratory  variability, suggesting right atrial pressure of 8 mmHg.   Conclusion(s)/Recommendation(s): Findings consistent with ischemic  cardiomyopathy.    Patient Profile     86 y.o. male married Caucasian male who lives independently with his wife.  He goes to the gym 3 days a week.  He does have a history remote CAD status post intervention in  [place] over 20 years ago.  He has treated hypertension hyperlipidemia.  He is had recent chest pain with pain that was worse last night/this morning and was brought to the ER where he was evaluated by Dr. Radford Pax and take him urgently to the Cath Lab by Dr. Tamala Julian revealing highly calcified vessels with severe left main/three-vessel disease and moderate LV dysfunction.  He underwent CABG x4 by Dr. Kipp Brood on 01/23/2020.  He is clinically stable on multiple pressor agents.  Assessment & Plan    1: Coronary artery disease-troponin is 4000.  His EKG shows ST segment elevation in lead aVR with depression otherwise suggesting global subendocardial ischemia.  Cath revealed severe calcified three-vessel disease with severe left main and subtotally occluded ostial circumflex, subtotally occluded mid LAD with right to left collaterals.  His anatomy is not suitable for percutaneous intervention.  He is high risk for CABG but this may be his only option.  He is otherwise independent.  Dr. Kipp Brood performed CABG x4 on 5/95/6387 without complication.  2: Essential hypertension-on ARB and diltiazem as an outpatient.  His blood pressure is soft on multiple pressor agents currently.  3: Hyperlipidemia-on atorvastatin with total cholesterol 181 and HDL 107.  4: Ischemic cardiomyopathy-EF 30% by 2D echo with mild to moderate MR and mild left ear.  I suspect this will improve with revascularization.  INO +1.8 L.  He received IV diuretics.  T CTS.  5: Moderate renal insufficiency-serum creatinine improved from 1.8 down to 1.37.  Creatinine currently up to 1.58 today.  6: PAF-patient had episode of PAF last night currently on IV amiodarone converted to sinus rhythm.  Postop day 2 CABG x4.  On multiple pressor agents including dobutamine, vasopressin and and phenylephrine.  Blood pressure in the 130/70 range this morning.  Titration of IV medications per T CTS.  Currently in sinus rhythm on amiodarone.  Normal  progression.  For questions or updates, please contact [place] Please consult [email or web address] for contact info under        Signed, Quay Burow, MD  01/25/2020, 8:20 AM

## 2020-01-26 ENCOUNTER — Inpatient Hospital Stay (HOSPITAL_COMMUNITY): Payer: Medicare Other

## 2020-01-26 LAB — CBC
HCT: 29.3 % — ABNORMAL LOW (ref 39.0–52.0)
Hemoglobin: 10.1 g/dL — ABNORMAL LOW (ref 13.0–17.0)
MCH: 31.4 pg (ref 26.0–34.0)
MCHC: 34.5 g/dL (ref 30.0–36.0)
MCV: 91 fL (ref 80.0–100.0)
Platelets: 142 10*3/uL — ABNORMAL LOW (ref 150–400)
RBC: 3.22 MIL/uL — ABNORMAL LOW (ref 4.22–5.81)
RDW: 15.9 % — ABNORMAL HIGH (ref 11.5–15.5)
WBC: 8.8 10*3/uL (ref 4.0–10.5)
nRBC: 0 % (ref 0.0–0.2)

## 2020-01-26 LAB — TYPE AND SCREEN
ABO/RH(D): A POS
Antibody Screen: NEGATIVE
Unit division: 0

## 2020-01-26 LAB — BASIC METABOLIC PANEL
Anion gap: 11 (ref 5–15)
Anion gap: 9 (ref 5–15)
BUN: 56 mg/dL — ABNORMAL HIGH (ref 8–23)
BUN: 59 mg/dL — ABNORMAL HIGH (ref 8–23)
CO2: 19 mmol/L — ABNORMAL LOW (ref 22–32)
CO2: 22 mmol/L (ref 22–32)
Calcium: 8 mg/dL — ABNORMAL LOW (ref 8.9–10.3)
Calcium: 8.1 mg/dL — ABNORMAL LOW (ref 8.9–10.3)
Chloride: 101 mmol/L (ref 98–111)
Chloride: 99 mmol/L (ref 98–111)
Creatinine, Ser: 1.89 mg/dL — ABNORMAL HIGH (ref 0.61–1.24)
Creatinine, Ser: 1.98 mg/dL — ABNORMAL HIGH (ref 0.61–1.24)
GFR calc Af Amer: 36 mL/min — ABNORMAL LOW (ref >60)
GFR calc Af Amer: 34 mL/min — ABNORMAL LOW (ref >60)
GFR calc non Af Amer: 31 mL/min — ABNORMAL LOW (ref >60)
GFR calc non Af Amer: 29 mL/min — ABNORMAL LOW (ref >60)
Glucose, Bld: 120 mg/dL — ABNORMAL HIGH (ref 70–99)
Glucose, Bld: 155 mg/dL — ABNORMAL HIGH (ref 70–99)
Potassium: 3.5 mmol/L (ref 3.5–5.1)
Potassium: 4.1 mmol/L (ref 3.5–5.1)
Sodium: 131 mmol/L — ABNORMAL LOW (ref 135–145)
Sodium: 130 mmol/L — ABNORMAL LOW (ref 135–145)

## 2020-01-26 LAB — BPAM RBC
Blood Product Expiration Date: 202108232359
ISSUE DATE / TIME: 202107300515
Unit Type and Rh: 6200

## 2020-01-26 LAB — GLUCOSE, CAPILLARY
Glucose-Capillary: 112 mg/dL — ABNORMAL HIGH (ref 70–99)
Glucose-Capillary: 117 mg/dL — ABNORMAL HIGH (ref 70–99)
Glucose-Capillary: 193 mg/dL — ABNORMAL HIGH (ref 70–99)
Glucose-Capillary: 162 mg/dL — ABNORMAL HIGH (ref 70–99)
Glucose-Capillary: 102 mg/dL — ABNORMAL HIGH (ref 70–99)
Glucose-Capillary: 125 mg/dL — ABNORMAL HIGH (ref 70–99)

## 2020-01-26 MED ORDER — POTASSIUM CHLORIDE 10 MEQ/50ML IV SOLN
10.0000 meq | INTRAVENOUS | Status: AC
  Administered 2020-01-26 (×3): 10 meq via INTRAVENOUS
  Filled 2020-01-26 (×3): qty 50

## 2020-01-26 MED ORDER — ALBUMIN HUMAN 5 % IV SOLN
25.0000 g | Freq: Once | INTRAVENOUS | Status: AC
  Administered 2020-01-26: 25 g via INTRAVENOUS
  Filled 2020-01-26: qty 500

## 2020-01-26 MED ORDER — COLCHICINE 0.3 MG HALF TABLET
0.3000 mg | ORAL_TABLET | Freq: Two times a day (BID) | ORAL | Status: DC
  Administered 2020-01-26 – 2020-01-28 (×5): 0.3 mg via ORAL
  Filled 2020-01-26 (×6): qty 1

## 2020-01-26 NOTE — Progress Notes (Signed)
TCTS Evening Rounds  Stable day hemodynamically Remains on dobut and amio gtts   Intake/Output Summary (Last 24 hours) at 01/26/2020 2136 Last data filed at 01/26/2020 2100 Gross per 24 hour  Intake 1592.12 ml  Output 1940 ml  Net -347.88 ml     BP (!) 98/63   Pulse 54   Temp 97.6 F (36.4 C) (Oral)   Resp 13   Ht 5' 9.5" (1.765 m)   Wt 86.8 kg   SpO2 99%   BMI 27.85 kg/m   AF, NAD CTA RRR  sCr up to 1.98  A/p: gently resuscitate overnight Does not appear vol overloaded; has been diuresing out of CTs  Abed Schar Z. Orvan Seen, [place]

## 2020-01-26 NOTE — Progress Notes (Signed)
3 Days Post-Op Procedure(s) (LRB): CORONARY ARTERY BYPASS GRAFTING (CABG), ON PUMP, TIMES FOUR, USING LEFT INTERNAL MAMMARY ARTERY AND ENDOSCOPICALLY HARVESTED LEFT GREATER SAPHENOUS VEIN (N/A) TRANSESOPHAGEAL ECHOCARDIOGRAM (TEE) (N/A) Subjective: No complaints  Objective: Vital signs in last 24 hours: Temp:  [97.2 F (36.2 C)-98.8 F (37.1 C)] 98.8 F (37.1 C) (07/31 0342) Pulse Rate:  [66-73] 72 (07/31 0700) Cardiac Rhythm: Heart block (07/30 1938) Resp:  [11-28] 18 (07/31 0700) BP: (92-153)/(49-92) 108/73 (07/31 0700) SpO2:  [94 %-100 %] 97 % (07/31 0700) Arterial Line BP: (102-128)/(49-67) 123/58 (07/31 0400)  Hemodynamic parameters for last 24 hours:    Intake/Output from previous day: 07/30 0701 - 07/31 0700 In: 1612.1 [P.O.:240; I.V.:1057.1; Blood:315] Out: 1025 [Urine:695; Chest Tube:330] Intake/Output this shift: Total I/O In: 282.7 [P.O.:240; I.V.:42.7] Out: 540 [Chest Tube:540]  General appearance: alert and cooperative Neurologic: intact Heart: regular rate and rhythm, S1, S2 normal, no murmur, click, rub or gallop Lungs: clear to auscultation bilaterally Abdomen: soft, non-tender; bowel sounds normal; no masses,  no organomegaly Extremities: edema 1+ Wound: dressed, dry  Lab Results: Recent Labs    01/25/20 0240 01/26/20 0433  WBC 8.0 8.8  HGB 8.3* 10.1*  HCT 25.1* 29.3*  PLT 120* 142*   BMET:  Recent Labs    01/25/20 0914 01/26/20 0433  NA 131* 131*  K 4.1 3.5  CL 103 101  CO2 17* 19*  GLUCOSE 124* 120*  BUN 51* 56*  CREATININE 1.74* 1.89*  CALCIUM 7.9* 8.0*    PT/INR:  Recent Labs    01/23/20 1332  LABPROT 18.2*  INR 1.6*   ABG    Component Value Date/Time   PHART 7.396 01/23/2020 2244   HCO3 26.2 01/23/2020 2244   TCO2 28 01/23/2020 2244   ACIDBASEDEF 4.0 (H) 01/23/2020 1333   O2SAT 98.0 01/23/2020 2244   CBG (last 3)  Recent Labs    01/25/20 2347 01/26/20 0338 01/26/20 0655  GLUCAP 133* 112* 117*     Assessment/Plan: S/P Procedure(s) (LRB): CORONARY ARTERY BYPASS GRAFTING (CABG), ON PUMP, TIMES FOUR, USING LEFT INTERNAL MAMMARY ARTERY AND ENDOSCOPICALLY HARVESTED LEFT GREATER SAPHENOUS VEIN (N/A) TRANSESOPHAGEAL ECHOCARDIOGRAM (TEE) (N/A) Mobilize chest tube to water seal  Repeat chem in afternoon; watching renal function Continue Dobut   LOS: 5 days    Wonda Olds 01/26/2020

## 2020-01-26 NOTE — Progress Notes (Signed)
Changed [facility] Chest tube drainage system.  Eden Emms RN

## 2020-01-27 ENCOUNTER — Inpatient Hospital Stay (HOSPITAL_COMMUNITY): Payer: Medicare Other

## 2020-01-27 DIAGNOSIS — I5021 Acute systolic (congestive) heart failure: Secondary | ICD-10-CM

## 2020-01-27 LAB — CBC
HCT: 29.2 % — ABNORMAL LOW (ref 39.0–52.0)
Hemoglobin: 9.6 g/dL — ABNORMAL LOW (ref 13.0–17.0)
MCH: 31.5 pg (ref 26.0–34.0)
MCHC: 32.9 g/dL (ref 30.0–36.0)
MCV: 95.7 fL (ref 80.0–100.0)
Platelets: 149 10*3/uL — ABNORMAL LOW (ref 150–400)
RBC: 3.05 MIL/uL — ABNORMAL LOW (ref 4.22–5.81)
RDW: 16 % — ABNORMAL HIGH (ref 11.5–15.5)
WBC: 8.5 10*3/uL (ref 4.0–10.5)
nRBC: 0 % (ref 0.0–0.2)

## 2020-01-27 LAB — BASIC METABOLIC PANEL
Anion gap: 12 (ref 5–15)
BUN: 58 mg/dL — ABNORMAL HIGH (ref 8–23)
CO2: 19 mmol/L — ABNORMAL LOW (ref 22–32)
Calcium: 8 mg/dL — ABNORMAL LOW (ref 8.9–10.3)
Chloride: 100 mmol/L (ref 98–111)
Creatinine, Ser: 2.07 mg/dL — ABNORMAL HIGH (ref 0.61–1.24)
GFR calc Af Amer: 32 mL/min — ABNORMAL LOW (ref >60)
GFR calc non Af Amer: 28 mL/min — ABNORMAL LOW (ref >60)
Glucose, Bld: 141 mg/dL — ABNORMAL HIGH (ref 70–99)
Potassium: 4.1 mmol/L (ref 3.5–5.1)
Sodium: 131 mmol/L — ABNORMAL LOW (ref 135–145)

## 2020-01-27 LAB — ECHOCARDIOGRAM LIMITED
Area-P 1/2: 4.8 cm2
Height: 69.5 in
P 1/2 time: 483 msec
S' Lateral: 4.2 cm
Single Plane A4C EF: 35.5 %
Weight: 3140.8 oz

## 2020-01-27 LAB — GLUCOSE, CAPILLARY
Glucose-Capillary: 139 mg/dL — ABNORMAL HIGH (ref 70–99)
Glucose-Capillary: 177 mg/dL — ABNORMAL HIGH (ref 70–99)

## 2020-01-27 MED ORDER — FUROSEMIDE 10 MG/ML IJ SOLN
4.0000 mg/h | INTRAVENOUS | Status: DC
  Administered 2020-01-27: 4 mg/h via INTRAVENOUS
  Filled 2020-01-27: qty 25

## 2020-01-27 MED ORDER — INSULIN ASPART 100 UNIT/ML ~~LOC~~ SOLN
0.0000 [IU] | Freq: Three times a day (TID) | SUBCUTANEOUS | Status: DC
  Administered 2020-01-27 (×2): 2 [IU] via SUBCUTANEOUS
  Administered 2020-01-27 – 2020-01-28 (×3): 4 [IU] via SUBCUTANEOUS
  Administered 2020-01-29: 2 [IU] via SUBCUTANEOUS
  Administered 2020-01-29 – 2020-01-30 (×3): 4 [IU] via SUBCUTANEOUS

## 2020-01-27 MED ORDER — DOPAMINE-DEXTROSE 3.2-5 MG/ML-% IV SOLN
2.5000 ug/kg/min | INTRAVENOUS | Status: DC
  Administered 2020-01-27: 2.5 ug/kg/min via INTRAVENOUS
  Filled 2020-01-27: qty 250

## 2020-01-27 MED ORDER — ALBUMIN HUMAN 5 % IV SOLN
25.0000 g | Freq: Once | INTRAVENOUS | Status: AC
  Administered 2020-01-27: 25 g via INTRAVENOUS
  Filled 2020-01-27: qty 500

## 2020-01-27 NOTE — Progress Notes (Signed)
TCTS Evening Rounds  Stable day Echo performed showing EF 35-40% Sinus brady rate 50-60 UOP remains low; 740 mL out CTs today Peripheral edema 2+ CVP 10-14  A/p: increase dobut to 7 DA at 2.5 mcg/kg/min Lasix gtt at 4/hr Repeat labs in am Elisandro Jarrett Z. Orvan Seen, [place]

## 2020-01-27 NOTE — Evaluation (Signed)
Occupational Therapy Evaluation Patient Details Name: Bryan Dunn MRN: [id number] DOB: 08-05-1930 Today's Date: 01/27/2020    History of Present Illness Bryan Dunn is a 86 y.o. male with a hx of very remote CAD with PCI over 20 years ago in [place], HTN, HLD and neuropathy who presented to [facility] with complaints of chest pain, was positive for NSTEMI and cath demonstrated 3 vessel calcification, now s/p CABG x 4 on 01/23/20.   Clinical Impression   Pt PTA: Pt living with [place] with spouse and recently visiting family in [place] and on the way to [place]. Pt currently requiring assist for stability with mobiling/transfers minA +2, assist for ADL tasks set-upA to maxA and verbal cues to abide by sternal precautions VSS. 91/68 at rest, 100% O2 on RA, 60-80 BPM with exertion. Pt would benefit from OOB ADL and LB ADL tasks with AE education. Pt recalls no pushing, but requires assist for rest of precautions. Pt would benefit from continued OT skilled services for ADL, mobility and safety in [place] setting. OT following acutely.     Follow Up Recommendations  Home health OT;Supervision/Assistance - 24 hour    Equipment Recommendations  3 in 1 bedside commode    Recommendations for Other Services       Precautions / Restrictions Precautions Precautions: Sternal;Fall Precaution Comments: reveiwed precautions; recalling no push; required cues for no pulling and "staying in the tube" Restrictions Weight Bearing Restrictions: No      Mobility Bed Mobility               General bed mobility comments: in recliner pre and post session  Transfers Overall transfer level: Needs assistance Equipment used: Rolling walker (2 wheeled) Transfers: Sit to/from [facility] Sit to Stand: Mod assist;+2 physical assistance;+2 safety/equipment;Min assist Stand pivot transfers: Min assist;+2 physical assistance;+2 safety/equipment       General transfer comment: Pt requiring modA+2 for initial power up;  subsequent sit to stands, minA +2    Balance Overall balance assessment: Needs assistance Sitting-balance support: Feet supported Sitting balance-Leahy Scale: Fair     Standing balance support: Bilateral upper extremity supported Standing balance-Leahy Scale: Poor Standing balance comment: standing at sink for grooming                           ADL either performed or assessed with clinical judgement   ADL Overall ADL's : Needs assistance/impaired Eating/Feeding: Set up;Sitting   Grooming: Min guard;Standing   Upper Body Bathing: Set up;Sitting   Lower Body Bathing: Maximal assistance;Sitting/lateral leans;Sit to/from stand   Upper Body Dressing : Set up;Sitting   Lower Body Dressing: Maximal assistance;Sitting/lateral leans;Sit to/from stand Lower Body Dressing Details (indicate cue type and reason): pt requiring assist for donning shoes; pt able to get LLE part of the way up for figure 4 technique, but unable to assist without pulling. Pt would benefit from further education for AE Toilet Transfer: Minimal assistance;+2 for physical assistance;+2 for safety/equipment;Ambulation;RW   Toileting- Clothing Manipulation and Hygiene: Moderate assistance;+2 for physical assistance;Cueing for safety;Cueing for sequencing;Sitting/lateral lean;Sit to/from stand       Functional mobility during ADLs: Minimal assistance;+2 for physical assistance;Rolling walker;Cueing for safety;Cueing for sequencing General ADL Comments: Pt requiring assist for stability with mobiling/transfers, assist for ADL tasks and verbal cues to abide by sternal precautions     Vision Baseline Vision/History: Wears glasses Wears Glasses: At all times Patient Visual Report: No change from baseline Vision Assessment?: No apparent  visual deficits     Perception     Praxis      Pertinent Vitals/Pain Pain Assessment: Faces     Hand Dominance Left   Extremity/Trunk Assessment Upper Extremity  Assessment Upper Extremity Assessment: Generalized weakness   Lower Extremity Assessment Lower Extremity Assessment: Defer to PT evaluation;Generalized weakness   Cervical / Trunk Assessment Cervical / Trunk Assessment: Kyphotic   Communication Communication Communication: No difficulties   Cognition Arousal/Alertness: Awake/alert Behavior During Therapy: WFL for tasks assessed/performed Overall Cognitive Status: Within Functional Limits for tasks assessed                                     General Comments  VSS. 91/68 at rest, 100% O2 on RA, 60-80 BPM with exertion.    Exercises     Shoulder Instructions      Home Living Family/patient expects to be discharged to:: Private residence Living Arrangements: Spouse/significant other Available Help at Discharge: Family Type of Home: House Home Access: Stairs to enter Technical brewer of Steps: 3 Entrance Stairs-Rails: Right;Left Home Layout: One level     Bathroom Shower/Tub: Occupational psychologist: Standard     Home Equipment: Civil engineer, contracting - built in          Prior Functioning/Environment Level of Independence: Independent        Comments: retired, living in [place] with his wife, was on the way to [place] to visit another son, but stopped here to visit different son; nephew is CTS in [place] he was planning to see.        OT Problem List: Decreased strength;Decreased activity tolerance;Impaired balance (sitting and/or standing);Decreased cognition;Pain;Increased edema;Cardiopulmonary status limiting activity      OT Treatment/Interventions: Self-care/ADL training;Therapeutic exercise;Energy conservation;DME and/or AE instruction;Therapeutic activities;Visual/perceptual remediation/compensation;Patient/family education;Balance training    OT Goals(Current goals can be found in the care plan section) Acute Rehab OT Goals Patient Stated Goal: to get back home OT Goal  Formulation: With patient Time For Goal Achievement: 02/10/20 Potential to Achieve Goals: Good ADL Goals Pt Will Perform Lower Body Dressing: with min guard assist;sitting/lateral leans;sit to/from stand;with adaptive equipment Pt Will Transfer to Toilet: with min guard assist;ambulating Pt Will Perform Toileting - Clothing Manipulation and hygiene: with min guard assist;sitting/lateral leans;sit to/from stand Pt/caregiver will Perform Home Exercise Program: Increased strength;Both right and left upper extremity;With written HEP provided Additional ADL Goal #1: Pt will increase to x10 mins of OOB ADL in standing with 1 seated rest break.  OT Frequency: Min 2X/week   Barriers to D/C:            Co-evaluation              AM-PAC OT "6 Clicks" Daily Activity     Outcome Measure Help from another person eating meals?: None Help from another person taking care of personal grooming?: A Little Help from another person toileting, which includes using toliet, bedpan, or urinal?: A Lot Help from another person bathing (including washing, rinsing, drying)?: A Lot Help from another person to put on and taking off regular upper body clothing?: A Little Help from another person to put on and taking off regular lower body clothing?: A Lot 6 Click Score: 16   End of Session Equipment Utilized During Treatment: Gait belt;Rolling walker Nurse Communication: Mobility status  Activity Tolerance: Patient tolerated treatment well Patient left: with nursing/sitter in room;Other (comment) (Pt up in  hall with NT and RN to continue full lap)  OT Visit Diagnosis: Unsteadiness on feet (R26.81);Muscle weakness (generalized) (M62.81);Pain Pain - part of body:  (soreness all over)                Time: 0820-0852 OT Time Calculation (min): 32 min Charges:  OT General Charges $OT Visit: 1 Visit OT Evaluation $OT Eval Moderate Complexity: 1 Mod OT Treatments $Self Care/Home Management : 8-22  mins  Jefferey Pica, OTR/L Acute Rehabilitation Services Pager: [phone] Office: [place] [facility] 01/27/2020, 11:20 AM

## 2020-01-27 NOTE — Progress Notes (Signed)
  Echocardiogram 2D Echocardiogram has been performed.  Bryan Dunn 01/27/2020, 10:38 AM

## 2020-01-27 NOTE — Progress Notes (Signed)
4 Days Post-Op Procedure(s) (LRB): CORONARY ARTERY BYPASS GRAFTING (CABG), ON PUMP, TIMES FOUR, USING LEFT INTERNAL MAMMARY ARTERY AND ENDOSCOPICALLY HARVESTED LEFT GREATER SAPHENOUS VEIN (N/A) TRANSESOPHAGEAL ECHOCARDIOGRAM (TEE) (N/A) Subjective: Feeling better  Objective: Vital signs in last 24 hours: Temp:  [97.5 F (36.4 C)-97.8 F (36.6 C)] 97.5 F (36.4 C) (08/01 0625) Pulse Rate:  [53-125] 61 (08/01 0700) Cardiac Rhythm: Sinus bradycardia (07/31 2000) Resp:  [10-23] 18 (08/01 0700) BP: (75-111)/(39-76) 91/76 (08/01 0700) SpO2:  [94 %-100 %] 99 % (08/01 0700) Weight:  [89 kg] 89 kg (08/01 0625)  Hemodynamic parameters for last 24 hours:    Intake/Output from previous day: 07/31 0701 - 08/01 0700 In: 1731.2 [P.O.:480; I.V.:806.6; IV Piggyback:444.6] Out: 2005 [Urine:645; Chest Tube:1360] Intake/Output this shift: No intake/output data recorded.  General appearance: alert and cooperative Neurologic: intact Heart: regular rate and rhythm, S1, S2 normal, no murmur, click, rub or gallop Lungs: clear to auscultation bilaterally Extremities: edema 1+ Wound: c/d/i  Lab Results: Recent Labs    01/26/20 0433 01/27/20 0521  WBC 8.8 8.5  HGB 10.1* 9.6*  HCT 29.3* 29.2*  PLT 142* 149*   BMET:  Recent Labs    01/26/20 1605 01/27/20 0521  NA 130* 131*  K 4.1 4.1  CL 99 100  CO2 22 19*  GLUCOSE 155* 141*  BUN 59* 58*  CREATININE 1.98* 2.07*  CALCIUM 8.1* 8.0*    PT/INR: No results for input(s): LABPROT, INR in the last 72 hours. ABG    Component Value Date/Time   PHART 7.396 01/23/2020 2244   HCO3 26.2 01/23/2020 2244   TCO2 28 01/23/2020 2244   ACIDBASEDEF 4.0 (H) 01/23/2020 1333   O2SAT 98.0 01/23/2020 2244   CBG (last 3)  Recent Labs    01/26/20 1917 01/26/20 2317 01/27/20 0626  GLUCAP 102* 125* 139*    Assessment/Plan: S/P Procedure(s) (LRB): CORONARY ARTERY BYPASS GRAFTING (CABG), ON PUMP, TIMES FOUR, USING LEFT INTERNAL MAMMARY ARTERY AND  ENDOSCOPICALLY HARVESTED LEFT GREATER SAPHENOUS VEIN (N/A) TRANSESOPHAGEAL ECHOCARDIOGRAM (TEE) (N/A) Mobilize echo  Monitor CVP to better assess vol status    LOS: 6 days    Wonda Olds 01/27/2020

## 2020-01-28 ENCOUNTER — Inpatient Hospital Stay (HOSPITAL_COMMUNITY): Payer: Medicare Other

## 2020-01-28 LAB — CBC
HCT: 30 % — ABNORMAL LOW (ref 39.0–52.0)
Hemoglobin: 9.7 g/dL — ABNORMAL LOW (ref 13.0–17.0)
MCH: 31.2 pg (ref 26.0–34.0)
MCHC: 32.3 g/dL (ref 30.0–36.0)
MCV: 96.5 fL (ref 80.0–100.0)
Platelets: 173 10*3/uL (ref 150–400)
RBC: 3.11 MIL/uL — ABNORMAL LOW (ref 4.22–5.81)
RDW: 15.8 % — ABNORMAL HIGH (ref 11.5–15.5)
WBC: 9.6 10*3/uL (ref 4.0–10.5)
nRBC: 0 % (ref 0.0–0.2)

## 2020-01-28 LAB — BASIC METABOLIC PANEL
Anion gap: 11 (ref 5–15)
BUN: 61 mg/dL — ABNORMAL HIGH (ref 8–23)
CO2: 19 mmol/L — ABNORMAL LOW (ref 22–32)
Calcium: 8 mg/dL — ABNORMAL LOW (ref 8.9–10.3)
Chloride: 99 mmol/L (ref 98–111)
Creatinine, Ser: 2.35 mg/dL — ABNORMAL HIGH (ref 0.61–1.24)
GFR calc Af Amer: 28 mL/min — ABNORMAL LOW (ref >60)
GFR calc non Af Amer: 24 mL/min — ABNORMAL LOW (ref >60)
Glucose, Bld: 155 mg/dL — ABNORMAL HIGH (ref 70–99)
Potassium: 3.9 mmol/L (ref 3.5–5.1)
Sodium: 129 mmol/L — ABNORMAL LOW (ref 135–145)

## 2020-01-28 LAB — GLUCOSE, CAPILLARY
Glucose-Capillary: 119 mg/dL — ABNORMAL HIGH (ref 70–99)
Glucose-Capillary: 132 mg/dL — ABNORMAL HIGH (ref 70–99)
Glucose-Capillary: 147 mg/dL — ABNORMAL HIGH (ref 70–99)
Glucose-Capillary: 148 mg/dL — ABNORMAL HIGH (ref 70–99)
Glucose-Capillary: 165 mg/dL — ABNORMAL HIGH (ref 70–99)
Glucose-Capillary: 180 mg/dL — ABNORMAL HIGH (ref 70–99)
Glucose-Capillary: 190 mg/dL — ABNORMAL HIGH (ref 70–99)
Glucose-Capillary: 200 mg/dL — ABNORMAL HIGH (ref 70–99)

## 2020-01-28 MED ORDER — ALBUMIN HUMAN 25 % IV SOLN
12.5000 g | Freq: Once | INTRAVENOUS | Status: AC
  Administered 2020-01-28: 12.5 g via INTRAVENOUS
  Filled 2020-01-28: qty 50

## 2020-01-28 MED ORDER — AMIODARONE HCL 200 MG PO TABS
400.0000 mg | ORAL_TABLET | Freq: Two times a day (BID) | ORAL | Status: DC
  Administered 2020-01-28 – 2020-02-06 (×16): 400 mg via ORAL
  Filled 2020-01-28 (×16): qty 2

## 2020-01-28 MED ORDER — ALBUMIN HUMAN 25 % IV SOLN
25.0000 g | Freq: Once | INTRAVENOUS | Status: AC
  Administered 2020-01-28: 25 g via INTRAVENOUS
  Filled 2020-01-28: qty 100

## 2020-01-28 NOTE — Progress Notes (Signed)
Physical Therapy Treatment Patient Details Name: Bryan Dunn MRN: [id number] DOB: May 23, 1931 Today's Date: 01/28/2020    History of Present Illness Mr. Riches is a 86 y.o. male with a hx of very remote CAD with PCI over 20 years ago in [place], HTN, HLD and neuropathy who presented to [facility] with complaints of chest pain, was positive for NSTEMI and cath demonstrated 3 vessel calcification, now s/p CABG x 4 on 01/23/20.    PT Comments    Pt very pleasant and reports having walked this am with nursing staff. Pt improving with mobility but continues to require extensive assist for all mobility and one son who is local was present during session and states return to his home at this time is not feasible until much more mobile. Family prefers ST-SN[facility] and D/C plan updated. Pt educated for sternal precautions, walking program, transfers and progression. Will continue to follow to maximize independence.   HR 65-77 90-100% on RA BP 89/60 (69)    Follow Up Recommendations  SNF;Supervision/Assistance - 24 hour     Equipment Recommendations  Rolling walker with 5" wheels;3in1 (PT)    Recommendations for Other Services       Precautions / Restrictions Precautions Precautions: Sternal;Fall Precaution Comments: chest tube    Mobility  Bed Mobility               General bed mobility comments: pt in recliner before session and BSC end  Transfers Overall transfer level: Needs assistance   Transfers: Sit to/from Stand Sit to Stand: Min assist;Mod assist         General transfer comment: pt stood x 4 trials with variation from min-mod assist with +2 for safety. Cues for hand placement, rocking and momentum to stand with cues to scoot to edge  Ambulation/Gait Ambulation/Gait assistance: Min assist;+2 safety/equipment Gait Distance (Feet): 30 Feet Assistive device: Rolling walker (2 wheeled) Gait Pattern/deviations: Step-through pattern;Decreased stride length;Shuffle;Trunk flexed   Gait  velocity interpretation: <1.8 ft/sec, indicate of risk for recurrent falls General Gait Details: Pt able to walk 4 bouts of short distances with close chair follow due to fatigue. pt walked 20', 30', 24', 15' respectively   Stairs             Wheelchair Mobility    Modified Rankin (Stroke Patients Only)       Balance Overall balance assessment: Needs assistance   Sitting balance-Leahy Scale: Fair     Standing balance support: Bilateral upper extremity supported Standing balance-Leahy Scale: Poor Standing balance comment: bil UE support on RW for gait                            Cognition Arousal/Alertness: Awake/alert Behavior During Therapy: WFL for tasks assessed/performed Overall Cognitive Status: Within Functional Limits for tasks assessed                                        Exercises      General Comments        Pertinent Vitals/Pain Pain Score: 4  Pain Location: chest Pain Descriptors / Indicators: Sore;Aching Pain Intervention(s): Limited activity within patient's tolerance;Monitored during session;Repositioned    Home Living                      Prior Function  PT Goals (current goals can now be found in the care plan section) Progress towards PT goals: Progressing toward goals    Frequency    Min 3X/week      PT Plan Discharge plan needs to be updated    Co-evaluation              AM-PAC PT "6 Clicks" Mobility   Outcome Measure  Help needed turning from your back to your side while in a flat bed without using bedrails?: A Little Help needed moving from lying on your back to sitting on the side of a flat bed without using bedrails?: A Lot Help needed moving to and from a bed to a chair (including a wheelchair)?: A Lot Help needed standing up from a chair using your arms (e.g., wheelchair or bedside chair)?: A Lot Help needed to walk in hospital room?: A Lot Help needed climbing  3-5 steps with a railing? : Total 6 Click Score: 12    End of Session Equipment Utilized During Treatment: Gait belt Activity Tolerance: Patient tolerated treatment well Patient left: Other (comment);with call bell/phone within reach;with nursing/sitter in room (on [facility]) Nurse Communication: Mobility status PT Visit Diagnosis: Other abnormalities of gait and mobility (R26.89);Difficulty in walking, not elsewhere classified (R26.2);Muscle weakness (generalized) (M62.81)     Time: 0388-8280 PT Time Calculation (min) (ACUTE ONLY): 24 min  Charges:  $Gait Training: 8-22 mins $Therapeutic Activity: 8-22 mins                     Wilhelmena Zea P, PT Acute Rehabilitation Services Pager: [phone] Office: [place] 01/28/2020, 10:34 AM

## 2020-01-28 NOTE — Progress Notes (Signed)
Changed [place] chest tube drainage system.  Eden Emms, RN

## 2020-01-28 NOTE — Progress Notes (Signed)
TCTS DAILY ICU PROGRESS NOTE                    [address]            [place] [address]          [phone]   5 Days Post-Op Procedure(s) (LRB): CORONARY ARTERY BYPASS GRAFTING (CABG), ON PUMP, TIMES FOUR, USING LEFT INTERNAL MAMMARY ARTERY AND ENDOSCOPICALLY HARVESTED LEFT GREATER SAPHENOUS VEIN (N/A) TRANSESOPHAGEAL ECHOCARDIOGRAM (TEE) (N/A)  Total Length of Stay:  LOS: 7 days   Subjective: No events overnight.  Objective: Vital signs in last 24 hours: Temp:  [97.5 F (36.4 C)-98 F (36.7 C)] 97.7 F (36.5 C) (08/02 0700) Pulse Rate:  [46-75] 72 (08/02 0600) Cardiac Rhythm: Normal sinus rhythm (08/02 0400) Resp:  [9-25] 13 (08/02 0600) BP: (92-112)/(53-70) 102/64 (08/02 0600) SpO2:  [92 %-100 %] 98 % (08/02 0600) Weight:  [89.5 kg] 89.5 kg (08/02 0500)  Filed Weights   01/25/20 0500 01/27/20 0625 01/28/20 0500  Weight: 86.8 kg 89 kg 89.5 kg    Weight change: 0.459 kg   Hemodynamic parameters for last 24 hours: CVP:  [9 mmHg-42 mmHg] 12 mmHg  Intake/Output from previous day: 08/01 0701 - 08/02 0700 In: 1625.9 [P.O.:480; I.V.:912; IV Piggyback:233.9] Out: 1227 [Urine:487; Chest Tube:740]  Intake/Output this shift: No intake/output data recorded.  Current Meds: Scheduled Meds: . amiodarone  400 mg Oral BID  . apixaban  5 mg Oral BID  . aspirin EC  81 mg Oral Daily   Or  . aspirin  81 mg Per Tube Daily  . atorvastatin  80 mg Oral Daily  . bisacodyl  10 mg Oral Daily   Or  . bisacodyl  10 mg Rectal Daily  . Chlorhexidine Gluconate Cloth  6 each Topical Daily  . colchicine  0.3 mg Oral BID  . docusate sodium  200 mg Oral Daily  . ferrous gluconate  324 mg Oral Q breakfast  . insulin aspart  0-24 Units Subcutaneous TID WC  . melatonin  3 mg Oral QHS  . metoprolol tartrate  12.5 mg Oral BID   Or  . metoprolol tartrate  12.5 mg Per Tube BID  . midodrine  10 mg Oral TID WC  . multivitamin  1 tablet Oral BID  . pantoprazole  40 mg  Oral Daily  . sodium chloride flush  10-40 mL Intracatheter Q12H  . sodium chloride flush  3 mL Intravenous Q12H   Continuous Infusions: . albumin human    . DOBUTamine 7 mcg/kg/min (01/28/20 0700)  . lactated ringers Stopped (01/28/20 0058)   PRN Meds:.DULoxetine, ipratropium, levalbuterol, lip balm, metoprolol tartrate, midazolam, morphine injection, ondansetron (ZOFRAN) IV, oxyCODONE, sodium chloride flush, sodium chloride flush, traMADol  General appearance: alert, cooperative and no distress Neurologic: intact Heart: Regular Lungs: Diminshed bibasilar breath sounds Abdomen: Soft, non tender, bowel sounds present Extremities: LE edema;hands and face swollen as well Wound: Sternal dressing removed and wound is clean and dry. Bilateral LEs wounds are mostly clean and dry  Lab Results: CBC: Recent Labs    01/27/20 0521 01/28/20 0300  WBC 8.5 9.6  HGB 9.6* 9.7*  HCT 29.2* 30.0*  PLT 149* 173   BMET:  Recent Labs    01/27/20 0521 01/28/20 0300  NA 131* 129*  K 4.1 3.9  CL 100 99  CO2 19* 19*  GLUCOSE 141* 155*  BUN 58* 61*  CREATININE 2.07* 2.35*  CALCIUM 8.0* 8.0*  CMET: Lab Results  Component Value Date   WBC 9.6 01/28/2020   HGB 9.7 (L) 01/28/2020   HCT 30.0 (L) 01/28/2020   PLT 173 01/28/2020   GLUCOSE 155 (H) 01/28/2020   CHOL 192 01/22/2020   TRIG 61 01/22/2020   HDL 68 01/22/2020   LDLCALC 112 (H) 01/22/2020   ALT 41 01/23/2020   AST 64 (H) 01/23/2020   NA 129 (L) 01/28/2020   K 3.9 01/28/2020   CL 99 01/28/2020   CREATININE 2.35 (H) 01/28/2020   BUN 61 (H) 01/28/2020   CO2 19 (L) 01/28/2020   INR 1.6 (H) 01/23/2020   HGBA1C 6.0 (H) 01/21/2020      PT/INR:  No results for input(s): LABPROT, INR in the last 72 hours. Radiology: DG Chest 1 View  Result Date: 01/28/2020 CLINICAL DATA:  Chest pain EXAM: CHEST  1 VIEW COMPARISON:  January 26, 2020 FINDINGS: Central catheter tip is in the superior vena cava. There are mediastinal drains and a  left chest tube, unchanged. No pneumothorax. There is mild bibasilar interstitial edema with small left pleural effusion. No consolidation. There is cardiomegaly with pulmonary vascularity normal. Patient is status post coronary artery bypass grafting. There is aortic atherosclerosis as well as foci of carotid artery calcification bilaterally. No adenopathy. No bone lesions. IMPRESSION: Stable cardiomegaly. Small left pleural effusion with bibasilar interstitial edema. There may be a degree of persistent congestive heart failure. No airspace consolidation. Tube and catheter positions as described without pneumothorax. Electronically Signed   By: Lowella Grip III M.D.   On: 01/28/2020 08:08   ECHOCARDIOGRAM LIMITED  Result Date: 01/27/2020    ECHOCARDIOGRAM LIMITED REPORT   Patient Name:   IVO Diosdado Date of Exam: 01/27/2020 Medical Rec #:  [id number]    Height:       69.5 in Accession #:    [id number]   Weight:       196.3 lb Date of Birth:  September 10, 1930    BSA:          2.060 m Patient Age:    86 years     BP:           91/76 mmHg Patient Gender: M            HR:           61 bpm. Exam Location:  Inpatient Procedure: Limited Echo, Limited Color Doppler and Cardiac Doppler Indications:    acute systolic chf 622.29  History:        Patient has prior history of Echocardiogram examinations, most                 recent 01/21/2020. Prior CABG; Risk Factors:Hypertension and                 Dyslipidemia.  Sonographer:    Johny Chess Referring Phys: [id number] HERDEYC Z ATKINS IMPRESSIONS  1. Left ventricular ejection fraction, by estimation, is 35 to 40%. The left ventricle has moderately decreased function. The left ventricle demonstrates regional wall motion abnormalities (see scoring diagram/findings for description).  2. Right ventricular systolic function is normal. The right ventricular size is normal. There is normal pulmonary artery systolic pressure.  3. The mitral valve is normal in structure. Mild mitral  valve regurgitation. No evidence of mitral stenosis.  4. Tricuspid valve regurgitation is mild to moderate.  5. The aortic valve is normal in structure. Aortic valve regurgitation is moderate. Mild to moderate aortic valve sclerosis/calcification is present, without any evidence  of aortic stenosis.  6. Aortic dilatation noted. There is mild dilatation of the ascending aorta measuring 38 mm.  7. The inferior vena cava is normal in size with greater than 50% respiratory variability, suggesting right atrial pressure of 3 mmHg. Comparison(s): The left ventricular function has improved. Prior EF 30-35%. FINDINGS  Left Ventricle: Left ventricular ejection fraction, by estimation, is 35 to 40%. The left ventricle has moderately decreased function. The left ventricle demonstrates regional wall motion abnormalities. The left ventricular internal cavity size was normal in size. There is no left ventricular hypertrophy.  LV Wall Scoring: The mid and distal anterior wall and mid anterolateral segment are hypokinetic. The mid inferolateral segment is normal. Right Ventricle: The right ventricular size is normal. No increase in right ventricular wall thickness. Right ventricular systolic function is normal. There is normal pulmonary artery systolic pressure. The tricuspid regurgitant velocity is 2.67 m/s, and  with an assumed right atrial pressure of 3 mmHg, the estimated right ventricular systolic pressure is 38.2 mmHg. Left Atrium: Left atrial size was normal in size. Right Atrium: Right atrial size was normal in size. Pericardium: There is no evidence of pericardial effusion. Mitral Valve: The mitral valve is normal in structure. Normal mobility of the mitral valve leaflets. Severe mitral annular calcification. Mild mitral valve regurgitation. No evidence of mitral valve stenosis. Tricuspid Valve: The tricuspid valve is normal in structure. Tricuspid valve regurgitation is mild to moderate. No evidence of tricuspid stenosis.  Aortic Valve: The aortic valve is normal in structure.. There is severe thickening and severe calcifcation of the aortic valve. Aortic valve regurgitation is moderate. Aortic regurgitation PHT measures 483 msec. Mild to moderate aortic valve sclerosis/calcification is present, without any evidence of aortic stenosis. There is severe thickening of the aortic valve. There is severe calcifcation of the aortic valve. Pulmonic Valve: The pulmonic valve was normal in structure. Pulmonic valve regurgitation is not visualized. No evidence of pulmonic stenosis. Aorta: Aortic dilatation noted. There is mild dilatation of the ascending aorta measuring 38 mm. Venous: The inferior vena cava is normal in size with greater than 50% respiratory variability, suggesting right atrial pressure of 3 mmHg. IAS/Shunts: No atrial level shunt detected by color flow Doppler. LEFT VENTRICLE PLAX 2D LVIDd:         5.10 cm      Diastology LVIDs:         4.20 cm      LV e' lateral:   9.57 cm/s LV PW:         1.30 cm      LV E/e' lateral: 11.2 LV IVS:        1.10 cm      LV e' medial:    6.20 cm/s LVOT diam:     2.50 cm      LV E/e' medial:  17.3 LV SV:         91 LV SV Index:   44 LVOT Area:     4.91 cm  LV Volumes (MOD) LV vol d, MOD A4C: 101.0 ml LV vol s, MOD A4C: 65.1 ml LV SV MOD A4C:     101.0 ml IVC IVC diam: 1.50 cm LEFT ATRIUM         Index LA diam:    4.90 cm 2.38 cm/m  AORTIC VALVE LVOT Vmax:   98.90 cm/s LVOT Vmean:  72.500 cm/s LVOT VTI:    0.185 m AI PHT:      483 msec  AORTA Ao Root diam:  3.70 cm Ao Asc diam:  3.80 cm MITRAL VALVE                TRICUSPID VALVE MV Area (PHT): 4.80 cm     TR Peak grad:   28.5 mmHg MV Decel Time: 158 msec     TR Vmax:        267.00 cm/s MV E velocity: 107.00 cm/s MV A velocity: 42.60 cm/s   SHUNTS MV E/A ratio:  2.51         Systemic VTI:  0.18 m                             Systemic Diam: 2.50 cm Candee Furbish MD Electronically signed by Candee Furbish MD Signature Date/Time: 01/27/2020/10:56:05 AM     Final      Assessment/Plan: S/P Procedure(s) (LRB): CORONARY ARTERY BYPASS GRAFTING (CABG), ON PUMP, TIMES FOUR, USING LEFT INTERNAL MAMMARY ARTERY AND ENDOSCOPICALLY HARVESTED LEFT GREATER SAPHENOUS VEIN (N/A) TRANSESOPHAGEAL ECHOCARDIOGRAM (TEE) (N/A)   Neuro: Pain well controlled Cardiovascular: Appears to be in sinus rhythm.  Will transition to p.o. amiodarone.  Will DC dopamine.  We will keep dobutamine at 7.5.  On review of the echo he appears intravascularly depleted, thus we will stop Lasix drip and give 25% albumin for volume repletion.  He is globally edematous but in the setting of his acute kidney injury I think that  intravascular repletion will be beneficial.  We will keep chest tubes for now given the output Pulmonary: Continue pulmonary toilet. Renal: Creatinine continues to rise will DC Lasix drip and give 25% albumin. GI: Tolerating diet Heme: Stable ID: Afebrile Dispo: We will continue ICU care.  Lesly Pontarelli O Jenavee Laguardia\

## 2020-01-28 NOTE — Progress Notes (Signed)
CT surgery p.m. Rounds  Patient had a stable day ambulated in hall and with minimal surgical pain Creatinine slowly rising, urine output suboptimal but hemodynamics and blood pressure stable.  Receiving albumin and dobutamine to maintain vascular volume and renal perfusion.  Blood pressure (!) 85/59, pulse (!) 25, temperature 98.1 F (36.7 C), resp. rate (!) 23, height 5' 9.5" (1.765 m), weight 89.5 kg, SpO2 (!) 80 %.

## 2020-01-29 LAB — POCT I-STAT, CHEM 8
BUN: 61 mg/dL — ABNORMAL HIGH (ref 8–23)
Calcium, Ion: 1.08 mmol/L — ABNORMAL LOW (ref 1.15–1.40)
Chloride: 96 mmol/L — ABNORMAL LOW (ref 98–111)
Creatinine, Ser: 3 mg/dL — ABNORMAL HIGH (ref 0.61–1.24)
Glucose, Bld: 138 mg/dL — ABNORMAL HIGH (ref 70–99)
HCT: 27 % — ABNORMAL LOW (ref 39.0–52.0)
Hemoglobin: 9.2 g/dL — ABNORMAL LOW (ref 13.0–17.0)
Potassium: 3.5 mmol/L (ref 3.5–5.1)
Sodium: 129 mmol/L — ABNORMAL LOW (ref 135–145)
TCO2: 20 mmol/L — ABNORMAL LOW (ref 22–32)

## 2020-01-29 LAB — BASIC METABOLIC PANEL
Anion gap: 10 (ref 5–15)
Anion gap: 11 (ref 5–15)
BUN: 64 mg/dL — ABNORMAL HIGH (ref 8–23)
BUN: 65 mg/dL — ABNORMAL HIGH (ref 8–23)
CO2: 19 mmol/L — ABNORMAL LOW (ref 22–32)
CO2: 18 mmol/L — ABNORMAL LOW (ref 22–32)
Calcium: 7.8 mg/dL — ABNORMAL LOW (ref 8.9–10.3)
Calcium: 7.9 mg/dL — ABNORMAL LOW (ref 8.9–10.3)
Chloride: 93 mmol/L — ABNORMAL LOW (ref 98–111)
Chloride: 97 mmol/L — ABNORMAL LOW (ref 98–111)
Creatinine, Ser: 2.73 mg/dL — ABNORMAL HIGH (ref 0.61–1.24)
Creatinine, Ser: 2.76 mg/dL — ABNORMAL HIGH (ref 0.61–1.24)
GFR calc Af Amer: 23 mL/min — ABNORMAL LOW (ref >60)
GFR calc Af Amer: 23 mL/min — ABNORMAL LOW (ref >60)
GFR calc non Af Amer: 20 mL/min — ABNORMAL LOW (ref >60)
GFR calc non Af Amer: 20 mL/min — ABNORMAL LOW (ref >60)
Glucose, Bld: 324 mg/dL — ABNORMAL HIGH (ref 70–99)
Glucose, Bld: 141 mg/dL — ABNORMAL HIGH (ref 70–99)
Potassium: 3.5 mmol/L (ref 3.5–5.1)
Potassium: 3.5 mmol/L (ref 3.5–5.1)
Sodium: 122 mmol/L — ABNORMAL LOW (ref 135–145)
Sodium: 126 mmol/L — ABNORMAL LOW (ref 135–145)

## 2020-01-29 LAB — GLUCOSE, CAPILLARY
Glucose-Capillary: 120 mg/dL — ABNORMAL HIGH (ref 70–99)
Glucose-Capillary: 126 mg/dL — ABNORMAL HIGH (ref 70–99)
Glucose-Capillary: 133 mg/dL — ABNORMAL HIGH (ref 70–99)
Glucose-Capillary: 133 mg/dL — ABNORMAL HIGH (ref 70–99)
Glucose-Capillary: 147 mg/dL — ABNORMAL HIGH (ref 70–99)
Glucose-Capillary: 147 mg/dL — ABNORMAL HIGH (ref 70–99)
Glucose-Capillary: 164 mg/dL — ABNORMAL HIGH (ref 70–99)
Glucose-Capillary: 164 mg/dL — ABNORMAL HIGH (ref 70–99)
Glucose-Capillary: 82 mg/dL (ref 70–99)

## 2020-01-29 LAB — MAGNESIUM: Magnesium: 2.5 mg/dL — ABNORMAL HIGH (ref 1.7–2.4)

## 2020-01-29 MED ORDER — METOCLOPRAMIDE HCL 5 MG/ML IJ SOLN
5.0000 mg | Freq: Three times a day (TID) | INTRAMUSCULAR | Status: DC
  Administered 2020-01-29 – 2020-02-06 (×24): 5 mg via INTRAVENOUS
  Filled 2020-01-29 (×23): qty 2

## 2020-01-29 MED ORDER — POTASSIUM CHLORIDE CRYS ER 20 MEQ PO TBCR
20.0000 meq | EXTENDED_RELEASE_TABLET | ORAL | Status: DC
  Administered 2020-01-29 (×2): 20 meq via ORAL
  Filled 2020-01-29 (×2): qty 1

## 2020-01-29 MED ORDER — ALBUMIN HUMAN 25 % IV SOLN
25.0000 g | Freq: Two times a day (BID) | INTRAVENOUS | Status: AC
  Administered 2020-01-29 (×2): 25 g via INTRAVENOUS
  Filled 2020-01-29 (×2): qty 100

## 2020-01-29 NOTE — Progress Notes (Signed)
Occupational Therapy Treatment Patient Details Name: Bryan Dunn MRN: [id number] DOB: May 10, 1931 Today's Date: 01/29/2020    History of present illness Bryan Dunn is a 86 y.o. male with a hx of very remote CAD with PCI over 20 years ago in [place], HTN, HLD and neuropathy who presented to [facility] with complaints of chest pain, was positive for NSTEMI and cath demonstrated 3 vessel calcification, now s/p CABG x 4 on 01/23/20.   OT comments  Pt presents seated in recliner upon arrival, agreeable to OT session however limited today as pt with soft BP and initially reporting feeling light headed start of session. Elevated LEs and engaged pt in gentle seated LB/UB ROM/activity, but with no significant increase in BP (BP remaining consistent throughout session). Pt did endorse feeling better post seated activity and with LEs elevated and semi-reclined in chair end of session. Pt requesting to remain in recliner (vs back to bed) and RN aware/agreeable for pt to remain OOB. Further reviewed sternal precautions and initiated education of applying precautions within ADL/functional context. Noted per chart pt/pt's family with preference for SNF at time of discharge. Feel this recommendation is appropriate given pt's current status. Will continue to follow acutely to progress pt towards established OT goals.   BP start of session 92/55 (67) with LEs elevated End of session 88/59 (69)  SpO2 >92% on RA   Follow Up Recommendations  SNF;Supervision/Assistance - 24 hour    Equipment Recommendations  3 in 1 bedside commode;Other (comment) (TBA)    Recommendations for Other Services      Precautions / Restrictions Precautions Precautions: Sternal;Fall Precaution Comments: chest tube, weeping LLE Restrictions Weight Bearing Restrictions:  (sternal precautions )       Mobility Bed Mobility               General bed mobility comments: seated in recliner upon arrival   Transfers                  General transfer comment: deferred, pt with soft BP and symptomatic - pt remaining soft even with seated activity     Balance                                           ADL either performed or assessed with clinical judgement   ADL Overall ADL's : Needs assistance/impaired     Grooming: Set up;Supervision/safety;Sitting               Lower Body Dressing: Maximal assistance;Total assistance Lower Body Dressing Details (indicate cue type and reason): assist to doff/don clean pair of socks               General ADL Comments: limited session due to soft BP this PM and pt reports intermittent lightheadedness. pt reports feeling better with LEs elevated (were down upon arrival to room). completed seated activity initially with hopes of increasing BP for further activity but BP remaining soft, pt with preference to remain in recliner at this time (RN aware and okay with pt remaining OOB). pt with LEs elevated and recliner back semi-reclined end of session. verbally reviewed sternal precautiosn within ADL context during session     Vision       Perception     Praxis      Cognition Arousal/Alertness: Awake/alert Behavior During Therapy: WFL for tasks assessed/performed Overall Cognitive Status: Within Functional Limits  for tasks assessed                                          Exercises Exercises: General Upper Extremity;General Lower Extremity General Exercises - Upper Extremity Elbow Flexion: AROM;Both;10 reps;Seated Elbow Extension: AROM;Both;10 reps;Seated Wrist Flexion: AROM;Both;Seated Wrist Extension: AROM;Both;Seated Digit Composite Flexion: AROM;Both;20 reps;Seated Composite Extension: AROM;Both;15 reps;Seated General Exercises - Lower Extremity Ankle Circles/Pumps: AROM;AAROM;Both;10 reps;Seated Straight Leg Raises: AROM;AAROM;Both;10 reps;Seated   Shoulder Instructions       General Comments      Pertinent Vitals/  Pain       Pain Assessment: Faces Faces Pain Scale: Hurts little more Pain Location: chest Pain Descriptors / Indicators: Sore;Aching Pain Intervention(s): Monitored during session;Repositioned  Home Living                                          Prior Functioning/Environment              Frequency  Min 2X/week        Progress Toward Goals  OT Goals(current goals can now be found in the care plan section)  Progress towards OT goals: OT to reassess next treatment  Acute Rehab OT Goals Patient Stated Goal: to get back home OT Goal Formulation: With patient Time For Goal Achievement: 02/10/20 Potential to Achieve Goals: Good ADL Goals Pt Will Perform Lower Body Dressing: with min guard assist;sitting/lateral leans;sit to/from stand;with adaptive equipment Pt Will Transfer to Toilet: with min guard assist;ambulating Pt Will Perform Toileting - Clothing Manipulation and hygiene: with min guard assist;sitting/lateral leans;sit to/from stand Pt/caregiver will Perform Home Exercise Program: Increased strength;Both right and left upper extremity;With written HEP provided Additional ADL Goal #1: Pt will increase to x10 mins of OOB ADL in standing with 1 seated rest break.  Plan Discharge plan needs to be updated    Co-evaluation                 AM-PAC OT "6 Clicks" Daily Activity     Outcome Measure   Help from another person eating meals?: None Help from another person taking care of personal grooming?: A Little Help from another person toileting, which includes using toliet, bedpan, or urinal?: A Lot Help from another person bathing (including washing, rinsing, drying)?: A Lot Help from another person to put on and taking off regular upper body clothing?: A Little Help from another person to put on and taking off regular lower body clothing?: A Lot 6 Click Score: 16    End of Session    OT Visit Diagnosis: Unsteadiness on feet (R26.81);Muscle  weakness (generalized) (M62.81);Pain Pain - part of body:  (generalized/chest )   Activity Tolerance Treatment limited secondary to medical complications (Comment) (limited due to soft BPs)   Patient Left in chair;with call bell/phone within reach;with family/visitor present   Nurse Communication Mobility status;Other (comment) (RN aware of pt status, okay to leave semi-reclined in chair )        Time: 1441-1500 OT Time Calculation (min): 19 min  Charges: OT General Charges $OT Visit: 1 Visit OT Treatments $Therapeutic Activity: 8-22 mins  Lou Cal, [place] Pager [phone] Office [phone]   Raymondo Band 01/29/2020, 4:46 PM

## 2020-01-29 NOTE — Progress Notes (Addendum)
TCTS DAILY ICU PROGRESS NOTE                   [address]            [place] [address]          [phone]   6 Days Post-Op Procedure(s) (LRB): CORONARY ARTERY BYPASS GRAFTING (CABG), ON PUMP, TIMES FOUR, USING LEFT INTERNAL MAMMARY ARTERY AND ENDOSCOPICALLY HARVESTED LEFT GREATER SAPHENOUS VEIN (N/A) TRANSESOPHAGEAL ECHOCARDIOGRAM (TEE) (N/A)  Total Length of Stay:  LOS: 8 days   Subjective: Feels okay this morning, he is having some nausea.   Objective: Vital signs in last 24 hours: Temp:  [97.6 F (36.4 C)-98.6 F (37 C)] 98 F (36.7 C) (08/03 0716) Pulse Rate:  [25-83] 83 (08/03 0500) Cardiac Rhythm: Atrial fibrillation (08/03 0600) Resp:  [11-28] 13 (08/03 0500) BP: (83-110)/(46-62) 95/49 (08/03 0500) SpO2:  [80 %-100 %] 99 % (08/03 0500) Weight:  [89.4 kg] 89.4 kg (08/03 0500)  Filed Weights   01/27/20 0625 01/28/20 0500 01/29/20 0500  Weight: 89 kg 89.5 kg 89.4 kg    Weight change: -0.1 kg   Hemodynamic parameters for last 24 hours: CVP:  [8 mmHg-13 mmHg] 8 mmHg  Intake/Output from previous day: 08/02 0701 - 08/03 0700 In: 1282.1 [P.O.:960; I.V.:322.1] Out: 772 [Urine:382; Chest Tube:390]  Intake/Output this shift: No intake/output data recorded.  Current Meds: Scheduled Meds: . amiodarone  400 mg Oral BID  . apixaban  5 mg Oral BID  . aspirin EC  81 mg Oral Daily   Or  . aspirin  81 mg Per Tube Daily  . atorvastatin  80 mg Oral Daily  . bisacodyl  10 mg Oral Daily   Or  . bisacodyl  10 mg Rectal Daily  . Chlorhexidine Gluconate Cloth  6 each Topical Daily  . colchicine  0.3 mg Oral BID  . docusate sodium  200 mg Oral Daily  . ferrous gluconate  324 mg Oral Q breakfast  . insulin aspart  0-24 Units Subcutaneous TID WC  . melatonin  3 mg Oral QHS  . metoprolol tartrate  12.5 mg Oral BID   Or  . metoprolol tartrate  12.5 mg Per Tube BID  . midodrine  10 mg Oral TID WC  . multivitamin  1 tablet Oral BID  . pantoprazole  40  mg Oral Daily  . potassium chloride  20 mEq Oral Q4H  . sodium chloride flush  10-40 mL Intracatheter Q12H  . sodium chloride flush  3 mL Intravenous Q12H   Continuous Infusions: . DOBUTamine 16 mcg/kg/min (01/29/20 0600)  . lactated ringers Stopped (01/28/20 0058)   PRN Meds:.DULoxetine, ipratropium, levalbuterol, lip balm, metoprolol tartrate, midazolam, morphine injection, ondansetron (ZOFRAN) IV, oxyCODONE, sodium chloride flush, sodium chloride flush, traMADol  General appearance: alert, cooperative and no distress Heart: regular rate and rhythm, S1, S2 normal, no murmur, click, rub or gallop Lungs: clear to auscultation bilaterally Abdomen: soft, non-tender; bowel sounds normal; no masses,  no organomegaly Extremities: 1-2+ pitting edema in bilaterally extremities Wound: clean and dry  Lab Results: CBC: Recent Labs    01/27/20 0521 01/28/20 0300  WBC 8.5 9.6  HGB 9.6* 9.7*  HCT 29.2* 30.0*  PLT 149* 173   BMET:  Recent Labs    01/29/20 0353 01/29/20 0537  NA 122* 126*  K 3.5 3.5  CL 93* 97*  CO2 19* 18*  GLUCOSE 324* 141*  BUN 64* 65*  CREATININE 2.73* 2.76*  CALCIUM  7.8* 7.9*    CMET: Lab Results  Component Value Date   WBC 9.6 01/28/2020   HGB 9.7 (L) 01/28/2020   HCT 30.0 (L) 01/28/2020   PLT 173 01/28/2020   GLUCOSE 141 (H) 01/29/2020   CHOL 192 01/22/2020   TRIG 61 01/22/2020   HDL 68 01/22/2020   LDLCALC 112 (H) 01/22/2020   ALT 41 01/23/2020   AST 64 (H) 01/23/2020   NA 126 (L) 01/29/2020   K 3.5 01/29/2020   CL 97 (L) 01/29/2020   CREATININE 2.76 (H) 01/29/2020   BUN 65 (H) 01/29/2020   CO2 18 (L) 01/29/2020   INR 1.6 (H) 01/23/2020   HGBA1C 6.0 (H) 01/21/2020      PT/INR: No results for input(s): LABPROT, INR in the last 72 hours. Radiology: No results found.   Assessment/Plan: S/P Procedure(s) (LRB): CORONARY ARTERY BYPASS GRAFTING (CABG), ON PUMP, TIMES FOUR, USING LEFT INTERNAL MAMMARY ARTERY AND ENDOSCOPICALLY HARVESTED  LEFT GREATER SAPHENOUS VEIN (N/A) TRANSESOPHAGEAL ECHOCARDIOGRAM (TEE) (N/A)  1. Neuro-Pain has been well controlled.  2. CV-afib rate controlled around 0500, now in NSR in the 80s, Continue oral Amio. MAPs in the 60s.  Remains on dobutamine gtt. Continue midodrine 10mg  TID and statin/ASA/Eliquis 3. Pulm-tolerating room air with good oxygen saturation. CT put out 390cc/24 hours. Keep.  4. H and H 9.7/30.0, expected acute blood loss anemia 5. Endo- blood glucose well controlled 6. Renal-creatinine 2.76, it appears his baseline was 1.4. Albumin given yesterday, not on diuretics. Urinary catheter still in place, patient had a green light procedure and it was successful. He has been urinating on his own at home. 7. GI- nausea this morning. Not much of an appetite. He has zofran ordered PRN. Adding reglan.    Plan: Keep foley-consult to renal. Chest tubes continue to drain-keep. Continue to wean dopamine as tolerated. Watching renal function closely. Will give another Albumin 25% today x 2. Switch central to a PICC.     Elgie Collard 01/29/2020 8:47 AM   Agree with above. Patient complains mostly of nausea, but continues to have weeping from both of his legs. I will reduce dobutamine to five today with a goal of titrating it off.  He is maintaining sinus rhythm on p.o. amiodarone. We will remove central line after PICC line is placed. Continues to have increase in his creatinine.  Nephrology has been consulted.  Will give two doses of 25% albumin.  Renally dosing medications as well. We will keep chest tubes until output is less than 250cc over 24 hours. Continue ICU care for now.  Bryan Dunn

## 2020-01-29 NOTE — Progress Notes (Signed)
Spoke with Elmyra Ricks, RN regarding request to assess patient for ML or PICC.  Patient does not qualify for ML due to medications at present.  PICC would be most appropriate if renal status will allow.  Patient currently has IJ introducer.  RN will speak with MD about PICC.

## 2020-01-29 NOTE — TOC Initial Note (Addendum)
Transition of Care [facility]) - Initial/Assessment Note    Patient Details  Name: Bryan Dunn MRN: [id number] Date of Birth: April 22, 1931  Transition of Care [facility]) CM/SW Contact:    Trula Ore, [place] Phone Number: 01/29/2020, 12:01 PM  Clinical Narrative:                  CSW spoke with patient at bedside. Patient is agreeable to SNF. Patient is from [place]. Patient agreed to [facility] faxing out initial referral near [place] area.  Patient gave CSW permission to speak with his son Bryan Dunn about his care.   Pending bed offers. CSW will need to start insurance authorization for patient when getting closer to dc.  Expected Discharge Plan: Skilled Nursing Facility Barriers to Discharge: Continued Medical Work up   Patient Goals and CMS Choice Patient states their goals for this hospitalization and ongoing recovery are:: SNF CMS Medicare.gov Compare Post Acute Care list provided to:: Patient Choice offered to / list presented to : Patient  Expected Discharge Plan and Services Expected Discharge Plan: [place]       Living arrangements for the past 2 months: Single Family Home                                      Prior Living Arrangements/Services Living arrangements for the past 2 months: Single Family Home Lives with:: Self, Spouse Patient language and need for interpreter reviewed:: Yes Do you feel safe going back to the place where you live?: No   SNF  Need for Family Participation in Patient Care: Yes (Comment) Care giver support system in place?: Yes (comment)   Criminal Activity/Legal Involvement Pertinent to Current Situation/Hospitalization: No - Comment as needed  Activities of Daily Living Home Assistive Devices/Equipment: Eyeglasses ADL Screening (condition at time of admission) Patient's cognitive ability adequate to safely complete daily activities?: Yes Is the patient deaf or have difficulty hearing?: No Does the patient have difficulty  seeing, even when wearing glasses/contacts?: No Does the patient have difficulty concentrating, remembering, or making decisions?: No Patient able to express need for assistance with ADLs?: Yes Does the patient have difficulty dressing or bathing?: Yes Independently performs ADLs?: No Communication: Independent Dressing (OT): Needs assistance Is this a change from baseline?: Change from baseline, expected to last >3 days Grooming: Independent Feeding: Independent Bathing: Needs assistance Is this a change from baseline?: Change from baseline, expected to last >3 days Toileting: Needs assistance Is this a change from baseline?: Change from baseline, expected to last >3days In/Out Bed: Needs assistance Is this a change from baseline?: Change from baseline, expected to last >3 days Walks in Home: Needs assistance Is this a change from baseline?: Change from baseline, expected to last >3 days Does the patient have difficulty walking or climbing stairs?: Yes Weakness of Legs: Both Weakness of Arms/Hands: Both  Permission Sought/Granted Permission sought to share information with : Case Manager, Family Supports, Chartered certified accountant granted to share information with : Yes, Verbal Permission Granted  Share Information with NAME: Bryan Dunn  Permission granted to share info w AGENCY: SNF  Permission granted to share info w Relationship: Son  Permission granted to share info w Contact Information: Bryan Dunn [phone]  Emotional Assessment Appearance:: Appears stated age Attitude/Demeanor/Rapport: Gracious Affect (typically observed): Calm Orientation: : Oriented to Self, Oriented to Place, Oriented to  Time, Oriented to Situation Alcohol / Substance Use: Not Applicable  Psych Involvement: No (comment)  Admission diagnosis:  Unstable angina (HCC) [I20.0] NSTEMI (non-ST elevated myocardial infarction) [facility]) [I21.4] Patient Active Problem List   Diagnosis Date Noted  .  Unstable angina ([place]) 01/21/2020  . CAD in native artery 01/21/2020  . NSTEMI (non-ST elevated myocardial infarction) ([place]) 01/21/2020  . Benign essential HTN   . HLD (hyperlipidemia)   . Neuropathy   . CAD (coronary artery disease)    PCP:  System, Pcp Not In Pharmacy:   [id number] [place], [place] [address] Phone: [phone] Fax: [phone]     Social Determinants of Health (SDOH) Interventions    Readmission Risk Interventions No flowsheet data found.

## 2020-01-29 NOTE — Progress Notes (Signed)
Around 0500 am the patient went into afib. Rates are controlled and patient is feeling okay, other than nausea that has been persisting the past couple days. I got an EKG so the rhythm is noted in the chart.   Lab results from morning BMP showed K of 3.5, I started TCTs potassium protocol and gave first dose PO.

## 2020-01-29 NOTE — NC FL2 (Signed)
[place] LEVEL OF CARE SCREENING TOOL     IDENTIFICATION  Patient Name: Bryan Dunn Birthdate: 1930/09/20 Sex: male Admission Date (Current Location): 01/21/2020  [facility] and [place] Number:  Herbalist and Address:  [facility], [place] [address] [place], [place] [address]      Provider Number: [id number]  Attending Physician Name and Address:  Lajuana Matte, MD  Relative Name and Phone Number:  Elta Guadeloupe [phone]    Current Level of Care: Hospital Recommended Level of Care: [place] Prior Approval Number:    Date Approved/Denied:   PASRR Number: [id number]  Discharge Plan: SNF    Current Diagnoses: Patient Active Problem List   Diagnosis Date Noted  . Unstable angina ([place]) 01/21/2020  . CAD in native artery 01/21/2020  . NSTEMI (non-ST elevated myocardial infarction) ([place]) 01/21/2020  . Benign essential HTN   . HLD (hyperlipidemia)   . Neuropathy   . CAD (coronary artery disease)     Orientation RESPIRATION BLADDER Height & Weight     Self, Time, Situation, Place  Normal External catheter, Continent (Urinary Catheter) Weight: 197 lb 1.5 oz (89.4 kg) Height:  5' 9.5" (176.5 cm)  BEHAVIORAL SYMPTOMS/MOOD NEUROLOGICAL BOWEL NUTRITION STATUS      Continent Diet (See Discharge Summary)  AMBULATORY STATUS COMMUNICATION OF NEEDS Skin   Limited Assist Verbally Surgical wounds, Other (Comment) (Approp for eth,Ecchymosis Arm;Thigh;L,Skin Tear Arm;Elbow L;Foam;Non-tenting,Incision Closed Leg R;Dressing change Daily;Dressinginplace,Incision Closed Leg L;Dressing Change Daily;Dressinginplace,Incision Closed Chest Other)                       Personal Care Assistance Level of Assistance  Bathing, Feeding, Dressing Bathing Assistance: Limited assistance Feeding assistance: Independent (able to feed self;Cardiac) Dressing Assistance: Limited assistance     Functional Limitations Info  Sight,  Hearing, Speech Sight Info: Adequate Hearing Info: Adequate Speech Info: Adequate    SPECIAL CARE FACTORS FREQUENCY  PT (By licensed PT), OT (By licensed OT)     PT Frequency: 5x min weekly OT Frequency: 5x min weekly            Contractures Contractures Info: Not present    Additional Factors Info  Code Status, Allergies, Insulin Sliding Scale Code Status Info: FULL Allergies Info: No known allergies   Insulin Sliding Scale Info: insulin aspart (novoLOG) injection 0-24 Units 3 times daily,       Current Medications (01/29/2020):  This is the current hospital active medication list Current Facility-Administered Medications  Medication Dose Route Frequency Provider Last Rate Last Admin  . albumin human 25 % solution 25 g  25 g Intravenous BID Lajuana Matte, MD 60 mL/hr at 01/29/20 1100 Rate Verify at 01/29/20 1100  . amiodarone (PACERONE) tablet 400 mg  400 mg Oral BID Lajuana Matte, MD   400 mg at 01/29/20 8413  . apixaban (ELIQUIS) tablet 5 mg  5 mg Oral BID Lyndee Leo, RPH   5 mg at 01/29/20 2440  . aspirin EC tablet 81 mg  81 mg Oral Daily Belva Crome, MD   81 mg at 01/29/20 1027   Or  . aspirin chewable tablet 81 mg  81 mg Per Tube Daily Belva Crome, MD   81 mg at 01/28/20 0908  . atorvastatin (LIPITOR) tablet 80 mg  80 mg Oral Daily Barrett, Erin R, PA-C   80 mg at 01/29/20 0807  . bisacodyl (DULCOLAX) EC tablet 10 mg  10 mg Oral Daily Barrett, Erin R, PA-C   10 mg at 01/29/20 3220   Or  . bisacodyl (DULCOLAX) suppository 10 mg  10 mg Rectal Daily Barrett, Erin R, PA-C      . Chlorhexidine Gluconate Cloth 2 % PADS 6 each  6 each Topical Daily Lajuana Matte, MD   6 each at 01/29/20 1038  . DOBUTamine (DOBUTREX) infusion 4000 mcg/mL  2.5-20 mcg/kg/min Intravenous Titrated Barrett, Erin R, PA-C 3.08 mL/hr at 01/29/20 1100 2.5 mcg/kg/min at 01/29/20 1100  . docusate sodium (COLACE) capsule 200 mg  200 mg Oral Daily Barrett, Erin R, PA-C   200  mg at 01/29/20 0808  . DULoxetine (CYMBALTA) DR capsule 20 mg  20 mg Oral Daily PRN Barrett, Erin R, PA-C   20 mg at 01/26/20 1002  . ferrous gluconate (FERGON) tablet 324 mg  324 mg Oral Q breakfast Nani Skillern, PA-C   324 mg at 01/29/20 2542  . insulin aspart (novoLOG) injection 0-24 Units  0-24 Units Subcutaneous TID WC Lajuana Matte, MD   2 Units at 01/29/20 1148  . ipratropium (ATROVENT) nebulizer solution 0.5 mg  0.5 mg Nebulization Q6H PRN Barrett, Erin R, PA-C      . lactated ringers infusion   Intravenous Continuous Barrett, Erin R, PA-C   Paused at 01/28/20 0058  . levalbuterol (XOPENEX) nebulizer solution 0.63 mg  0.63 mg Nebulization Q6H PRN Barrett, Erin R, PA-C      . lip balm (CARMEX) ointment   Topical PRN Barrett, Erin R, PA-C      . melatonin tablet 3 mg  3 mg Oral QHS Barrett, Erin R, PA-C   3 mg at 01/28/20 2103  . metoCLOPramide (REGLAN) injection 5 mg  5 mg Intravenous Q8H Conte, Tessa N, PA-C   5 mg at 01/29/20 1032  . metoprolol tartrate (LOPRESSOR) tablet 12.5 mg  12.5 mg Oral BID Barrett, Erin R, PA-C   12.5 mg at 01/26/20 1001   Or  . metoprolol tartrate (LOPRESSOR) 25 mg/10 mL oral suspension 12.5 mg  12.5 mg Per Tube BID Barrett, Erin R, PA-C      . metoprolol tartrate (LOPRESSOR) injection 2.5-5 mg  2.5-5 mg Intravenous Q2H PRN Barrett, Erin R, PA-C      . midazolam (VERSED) injection 2 mg  2 mg Intravenous Q1H PRN Barrett, Erin R, PA-C      . midodrine (PROAMATINE) tablet 10 mg  10 mg Oral TID WC Lightfoot, Harrell O, MD   10 mg at 01/29/20 1148  . morphine 2 MG/ML injection 1-4 mg  1-4 mg Intravenous Q1H PRN Barrett, Erin R, PA-C      . multivitamin (PROSIGHT) tablet 1 tablet  1 tablet Oral BID Barrett, Erin R, PA-C   1 tablet at 01/28/20 2103  . ondansetron (ZOFRAN) injection 4 mg  4 mg Intravenous Q6H PRN Barrett, Erin R, PA-C   4 mg at 01/29/20 0511  . oxyCODONE (Oxy IR/ROXICODONE) immediate release tablet 5-10 mg  5-10 mg Oral Q3H PRN Barrett,  Erin R, PA-C   5 mg at 01/28/20 2103  . pantoprazole (PROTONIX) EC tablet 40 mg  40 mg Oral Daily Barrett, Erin R, PA-C   40 mg at 01/29/20 0807  . sodium chloride flush (NS) 0.9 % injection 10-40 mL  10-40 mL Intracatheter Q12H Lajuana Matte, MD   10 mL at 01/28/20 2105  . sodium chloride flush (NS) 0.9 % injection 10-40 mL  10-40 mL Intracatheter PRN  Lajuana Matte, MD   10 mL at 01/23/20 1356  . sodium chloride flush (NS) 0.9 % injection 3 mL  3 mL Intravenous Q12H Barrett, Erin R, PA-C   3 mL at 01/28/20 2105  . sodium chloride flush (NS) 0.9 % injection 3 mL  3 mL Intravenous PRN Barrett, Erin R, PA-C      . traMADol (ULTRAM) tablet 50-100 mg  50-100 mg Oral Q4H PRN Barrett, Erin R, PA-C   100 mg at 01/26/20 1602     Discharge Medications: Please see discharge summary for a list of discharge medications.  Relevant Imaging Results:  Relevant Lab Results:   Additional Information [id number]  Trula Ore, LCSWA

## 2020-01-30 ENCOUNTER — Inpatient Hospital Stay (HOSPITAL_COMMUNITY): Payer: Medicare Other

## 2020-01-30 DIAGNOSIS — I351 Nonrheumatic aortic (valve) insufficiency: Secondary | ICD-10-CM

## 2020-01-30 LAB — GLUCOSE, CAPILLARY
Glucose-Capillary: 115 mg/dL — ABNORMAL HIGH (ref 70–99)
Glucose-Capillary: 118 mg/dL — ABNORMAL HIGH (ref 70–99)
Glucose-Capillary: 113 mg/dL — ABNORMAL HIGH (ref 70–99)
Glucose-Capillary: 171 mg/dL — ABNORMAL HIGH (ref 70–99)
Glucose-Capillary: 168 mg/dL — ABNORMAL HIGH (ref 70–99)
Glucose-Capillary: 190 mg/dL — ABNORMAL HIGH (ref 70–99)

## 2020-01-30 LAB — ECHOCARDIOGRAM LIMITED
Area-P 1/2: 4.15 cm2
Calc EF: 40.8 %
Height: 69.5 in
S' Lateral: 3.7 cm
Single Plane A2C EF: 44.3 %
Single Plane A4C EF: 37.3 %
Weight: 3227.53 oz

## 2020-01-30 LAB — BASIC METABOLIC PANEL
Anion gap: 14 (ref 5–15)
BUN: 74 mg/dL — ABNORMAL HIGH (ref 8–23)
CO2: 17 mmol/L — ABNORMAL LOW (ref 22–32)
Calcium: 8.1 mg/dL — ABNORMAL LOW (ref 8.9–10.3)
Chloride: 95 mmol/L — ABNORMAL LOW (ref 98–111)
Creatinine, Ser: 3.67 mg/dL — ABNORMAL HIGH (ref 0.61–1.24)
GFR calc Af Amer: 16 mL/min — ABNORMAL LOW (ref >60)
GFR calc non Af Amer: 14 mL/min — ABNORMAL LOW (ref >60)
Glucose, Bld: 121 mg/dL — ABNORMAL HIGH (ref 70–99)
Potassium: 4.6 mmol/L (ref 3.5–5.1)
Sodium: 126 mmol/L — ABNORMAL LOW (ref 135–145)

## 2020-01-30 LAB — URINALYSIS, ROUTINE W REFLEX MICROSCOPIC
Glucose, UA: NEGATIVE mg/dL
Ketones, ur: NEGATIVE mg/dL
Nitrite: NEGATIVE
Protein, ur: 30 mg/dL — AB
Specific Gravity, Urine: >1.03 — ABNORMAL HIGH (ref 1.005–1.030)
pH: 5 (ref 5.0–8.0)

## 2020-01-30 LAB — URINALYSIS, MICROSCOPIC (REFLEX)
RBC / HPF: >50 RBC/hpf (ref 0–5)
Squamous Epithelial / HPF: NONE SEEN (ref 0–5)
WBC, UA: >50 WBC/hpf (ref 0–5)

## 2020-01-30 MED ORDER — CIPROFLOXACIN HCL 500 MG PO TABS
500.0000 mg | ORAL_TABLET | ORAL | Status: AC
  Administered 2020-01-30 – 2020-02-05 (×7): 500 mg via ORAL
  Filled 2020-01-30 (×8): qty 1

## 2020-01-30 MED ORDER — FUROSEMIDE 10 MG/ML IJ SOLN
40.0000 mg | Freq: Four times a day (QID) | INTRAMUSCULAR | Status: DC
  Administered 2020-01-30: 40 mg via INTRAVENOUS
  Filled 2020-01-30: qty 4

## 2020-01-30 MED ORDER — INSULIN ASPART 100 UNIT/ML ~~LOC~~ SOLN
0.0000 [IU] | Freq: Three times a day (TID) | SUBCUTANEOUS | Status: DC
  Administered 2020-01-31 (×2): 2 [IU] via SUBCUTANEOUS
  Administered 2020-01-31: 4 [IU] via SUBCUTANEOUS
  Administered 2020-02-01 – 2020-02-03 (×7): 2 [IU] via SUBCUTANEOUS
  Administered 2020-02-03: 4 [IU] via SUBCUTANEOUS
  Administered 2020-02-04: 2 [IU] via SUBCUTANEOUS

## 2020-01-30 MED ORDER — ALBUMIN HUMAN 5 % IV SOLN
12.5000 g | Freq: Two times a day (BID) | INTRAVENOUS | Status: AC
  Administered 2020-01-30 (×2): 12.5 g via INTRAVENOUS
  Filled 2020-01-30 (×2): qty 250

## 2020-01-30 MED ORDER — PERFLUTREN LIPID MICROSPHERE
1.0000 mL | INTRAVENOUS | Status: AC | PRN
  Administered 2020-01-30: 2 mL via INTRAVENOUS
  Filled 2020-01-30: qty 10

## 2020-01-30 NOTE — Progress Notes (Signed)
1200ish Pt in junctional rhythm; MD notified with instructions to hold amio and monitor BPs  1600 RPh concern for bradycardia and hypotension r/t metoprolol; asking to hold night shift dose  1730 Foley d/c'ed 2/2 UTI; pt educated. Start Cipro  1800 BP remains soft; scheduled lasix held and addressed with RPh

## 2020-01-30 NOTE — Consult Note (Signed)
[facility] Renal Consultation Note  Requesting MD: Lightfoot Indication for Consultation: A on CRF  HPI:  Bryan Dunn is a 86 y.o. male with past medical history significant for hypertension, hyperlipidemia, remote diagnosis of CAD as well as some peripheral neuropathy.  Spite his age and medical conditions he is quite functional, exercising daily.  He has a home in [place].  He was here visiting his son when he presented to the hospital on 7/26 with chest pain-diagnosed with an NSTEMI.  Echo and cath found ejection fraction 30 to 35%-multivessel CAD including left main.  He underwent four-vessel CABG on 7/28.  Presurgical creatinine was 1.2 but really had been 1.4- 1.6.  Patient reports a history of bladder outlet obstruction that improved after a procedure.  Postop, creatinine stayed from 1.4-1.6 until 7/30.  It then went up to 2.  Then, starting on 8/2 creatinine has worsened by 0.40-  0.6 daily-today at a level of 3.67.  His urine output has decreased as well.  When I look at the chart there is been lot of low blood pressures-in the 70s and 80s including it around noon yesterday.  Patient is volume overloaded and having weeping of his lower extremities.  He did report nausea today but was better with the as needed medication.  His mentation seems quite good, understands what I am telling him  Creatinine, Ser  Date/Time Value Ref Range Status  01/30/2020 07:43 AM 3.67 (H) 0.61 - 1.24 mg/dL Final  01/29/2020 05:37 AM 2.76 (H) 0.61 - 1.24 mg/dL Final  01/29/2020 05:37 AM 3.00 (H) 0.61 - 1.24 mg/dL Final  01/29/2020 03:53 AM 2.73 (H) 0.61 - 1.24 mg/dL Final  01/28/2020 03:00 AM 2.35 (H) 0.61 - 1.24 mg/dL Final  01/27/2020 05:21 AM 2.07 (H) 0.61 - 1.24 mg/dL Final  01/26/2020 04:05 PM 1.98 (H) 0.61 - 1.24 mg/dL Final  01/26/2020 04:33 AM 1.89 (H) 0.61 - 1.24 mg/dL Final  01/25/2020 09:14 AM 1.74 (H) 0.61 - 1.24 mg/dL Final  01/25/2020 02:40 AM 1.58 (H) 0.61 - 1.24 mg/dL Final   01/24/2020 04:20 PM 1.57 (H) 0.61 - 1.24 mg/dL Final  01/24/2020 04:07 AM 1.45 (H) 0.61 - 1.24 mg/dL Final  01/23/2020 06:59 PM 1.46 (H) 0.61 - 1.24 mg/dL Final  01/23/2020 11:59 AM 1.20 0.61 - 1.24 mg/dL Final  01/23/2020 10:56 AM 1.40 (H) 0.61 - 1.24 mg/dL Final  01/23/2020 09:48 AM 1.50 (H) 0.61 - 1.24 mg/dL Final  01/23/2020 08:23 AM 1.40 (H) 0.61 - 1.24 mg/dL Final  01/23/2020 05:38 AM 1.66 (H) 0.61 - 1.24 mg/dL Final  01/22/2020 01:36 AM 1.37 (H) 0.61 - 1.24 mg/dL Final  01/21/2020 03:25 AM 1.80 (H) 0.61 - 1.24 mg/dL Final     PMHx:   Past Medical History:  Diagnosis Date  . Benign essential HTN   . CAD (coronary artery disease)    remote PCI > 20 years ago in [place]  . HLD (hyperlipidemia)   . Neuropathy     Past Surgical History:  Procedure Laterality Date  . CORONARY ANGIOPLASTY WITH STENT PLACEMENT    . CORONARY ARTERY BYPASS GRAFT N/A 01/23/2020   Procedure: CORONARY ARTERY BYPASS GRAFTING (CABG), ON PUMP, TIMES FOUR, USING LEFT INTERNAL MAMMARY ARTERY AND ENDOSCOPICALLY HARVESTED LEFT GREATER SAPHENOUS VEIN;  Surgeon: Lajuana Matte, MD;  Location: [facility];  Service: Open Heart Surgery;  Laterality: N/A;  SVG to PDA, SVG to OM, SVG to Diag, LIMA to LAD  . CORONARY/GRAFT ACUTE MI REVASCULARIZATION N/A 01/21/2020  Procedure: Coronary/Graft Acute MI Revascularization;  Surgeon: Belva Crome, MD;  Location: [facility];  Service: Cardiovascular;  Laterality: N/A;  . LEFT HEART CATH AND CORONARY ANGIOGRAPHY N/A 01/21/2020   Procedure: LEFT HEART CATH AND CORONARY ANGIOGRAPHY;  Surgeon: Belva Crome, MD;  Location: [facility];  Service: Cardiovascular;  Laterality: N/A;  . MEDIAL PARTIAL KNEE REPLACEMENT Bilateral   . TEE WITHOUT CARDIOVERSION N/A 01/23/2020   Procedure: TRANSESOPHAGEAL ECHOCARDIOGRAM (TEE);  Surgeon: Lajuana Matte, MD;  Location: [facility];  Service: Open Heart Surgery;  Laterality: N/A;    Family Hx:  Family History  Problem  Relation Age of Onset  . CAD Father     Social History:  reports that he has never smoked. He has never used smokeless tobacco. No history on file for alcohol use and drug use.  Allergies: No Known Allergies  Medications: Prior to Admission medications   Medication Sig Start Date End Date Taking? Authorizing Provider  acetaminophen (TYLENOL) 500 MG tablet Take 1,000 mg by mouth every 6 (six) hours as needed for mild pain.   Yes [provider]  atorvastatin (LIPITOR) 40 MG tablet Take 40 mg by mouth daily.   Yes [provider]  diltiazem (DILACOR XR) 120 MG 24 hr capsule Take 120 mg by mouth daily.   Yes [provider]  diphenhydramine-acetaminophen (TYLENOL PM) 25-500 MG TABS tablet Take 1 tablet by mouth at bedtime as needed (sleep).   Yes [provider]  DULoxetine (CYMBALTA) 20 MG capsule Take 20 mg by mouth daily as needed (neuropathy).    Yes [provider]  hydrochlorothiazide (HYDRODIURIL) 25 MG tablet Take 25 mg by mouth daily. 11/13/19  Yes [provider]  irbesartan (AVAPRO) 150 MG tablet Take 150 mg by mouth daily.   Yes [provider]  Multiple Vitamin (MULTIVITAMIN) capsule Take 1 capsule by mouth daily.   Yes [provider]  Multiple Vitamins-Minerals (PRESERVISION AREDS 2 PO) Take 1 tablet by mouth in the morning and at bedtime.   Yes [provider]    I have reviewed the patient's current medications.  Labs:  Results for orders placed or performed during the hospital encounter of 01/21/20 (from the past 48 hour(s))  Glucose, capillary     Status: Abnormal   Collection Time: 01/28/20  3:14 PM  Result Value Ref Range   Glucose-Capillary 180 (H) 70 - 99 mg/dL    Comment: Glucose reference range applies only to samples taken after fasting for at least 8 hours.  Glucose, capillary     Status: Abnormal   Collection Time: 01/28/20  8:27 PM  Result Value Ref Range   Glucose-Capillary 190  (H) 70 - 99 mg/dL    Comment: Glucose reference range applies only to samples taken after fasting for at least 8 hours.  Glucose, capillary     Status: Abnormal   Collection Time: 01/28/20 11:37 PM  Result Value Ref Range   Glucose-Capillary 148 (H) 70 - 99 mg/dL    Comment: Glucose reference range applies only to samples taken after fasting for at least 8 hours.  Basic metabolic panel     Status: Abnormal   Collection Time: 01/29/20  3:53 AM  Result Value Ref Range   Sodium 122 (L) 135 - 145 mmol/L   Potassium 3.5 3.5 - 5.1 mmol/L   Chloride 93 (L) 98 - 111 mmol/L   CO2 19 (L) 22 - 32 mmol/L   Glucose, Bld 324 (  H) 70 - 99 mg/dL    Comment: Glucose reference range applies only to samples taken after fasting for at least 8 hours.   BUN 64 (H) 8 - 23 mg/dL   Creatinine, Ser 2.73 (H) 0.61 - 1.24 mg/dL   Calcium 7.8 (L) 8.9 - 10.3 mg/dL   GFR calc non Af Amer 20 (L) >60 mL/min   GFR calc Af Amer 23 (L) >60 mL/min   Anion gap 10 5 - 15    Comment: Performed at [facility] [address] [place], [place] [address]  Glucose, capillary     Status: Abnormal   Collection Time: 01/29/20  4:00 AM  Result Value Ref Range   Glucose-Capillary 147 (H) 70 - 99 mg/dL    Comment: Glucose reference range applies only to samples taken after fasting for at least 8 hours.  Glucose, capillary     Status: Abnormal   Collection Time: 01/29/20  4:00 AM  Result Value Ref Range   Glucose-Capillary 147 (H) 70 - 99 mg/dL    Comment: Glucose reference range applies only to samples taken after fasting for at least 8 hours.  Basic metabolic panel     Status: Abnormal   Collection Time: 01/29/20  5:37 AM  Result Value Ref Range   Sodium 126 (L) 135 - 145 mmol/L   Potassium 3.5 3.5 - 5.1 mmol/L   Chloride 97 (L) 98 - 111 mmol/L   CO2 18 (L) 22 - 32 mmol/L   Glucose, Bld 141 (H) 70 - 99 mg/dL    Comment: Glucose reference range applies only to samples taken after fasting for at least 8 hours.   BUN 65  (H) 8 - 23 mg/dL   Creatinine, Ser 2.76 (H) 0.61 - 1.24 mg/dL   Calcium 7.9 (L) 8.9 - 10.3 mg/dL   GFR calc non Af Amer 20 (L) >60 mL/min   GFR calc Af Amer 23 (L) >60 mL/min   Anion gap 11 5 - 15    Comment: Performed at [facility] [address], [place], [place] [address]  Magnesium     Status: Abnormal   Collection Time: 01/29/20  5:37 AM  Result Value Ref Range   Magnesium 2.5 (H) 1.7 - 2.4 mg/dL    Comment: Performed at [facility] [address], [place], [place] [address]  I-STAT, [place] [address]     Status: Abnormal   Collection Time: 01/29/20  5:37 AM  Result Value Ref Range   Sodium 129 (L) 135 - 145 mmol/L   Potassium 3.5 3.5 - 5.1 mmol/L   Chloride 96 (L) 98 - 111 mmol/L   BUN 61 (H) 8 - 23 mg/dL   Creatinine, Ser 3.00 (H) 0.61 - 1.24 mg/dL   Glucose, Bld 138 (H) 70 - 99 mg/dL    Comment: Glucose reference range applies only to samples taken after fasting for at least 8 hours.   Calcium, Ion 1.08 (L) 1.15 - 1.40 mmol/L   TCO2 20 (L) 22 - 32 mmol/L   Hemoglobin 9.2 (L) 13.0 - 17.0 g/dL   HCT 27.0 (L) 39 - 52 %  Glucose, capillary     Status: Abnormal   Collection Time: 01/29/20  7:02 AM  Result Value Ref Range   Glucose-Capillary 164 (H) 70 - 99 mg/dL    Comment: Glucose reference range applies only to samples taken after fasting for at least 8 hours.  Glucose, capillary     Status: Abnormal  Collection Time: 01/29/20  7:02 AM  Result Value Ref Range   Glucose-Capillary 164 (H) 70 - 99 mg/dL    Comment: Glucose reference range applies only to samples taken after fasting for at least 8 hours.  Glucose, capillary     Status: Abnormal   Collection Time: 01/29/20 11:01 AM  Result Value Ref Range   Glucose-Capillary 133 (H) 70 - 99 mg/dL    Comment: Glucose reference range applies only to samples taken after fasting for at least 8 hours.  Glucose, capillary     Status: None   Collection Time: 01/29/20  3:06 PM  Result Value Ref Range   Glucose-Capillary 82  70 - 99 mg/dL    Comment: Glucose reference range applies only to samples taken after fasting for at least 8 hours.  Glucose, capillary     Status: Abnormal   Collection Time: 01/29/20  7:51 PM  Result Value Ref Range   Glucose-Capillary 126 (H) 70 - 99 mg/dL    Comment: Glucose reference range applies only to samples taken after fasting for at least 8 hours.  Glucose, capillary     Status: Abnormal   Collection Time: 01/29/20  8:21 PM  Result Value Ref Range   Glucose-Capillary 133 (H) 70 - 99 mg/dL    Comment: Glucose reference range applies only to samples taken after fasting for at least 8 hours.  Glucose, capillary     Status: Abnormal   Collection Time: 01/29/20 11:47 PM  Result Value Ref Range   Glucose-Capillary 120 (H) 70 - 99 mg/dL    Comment: Glucose reference range applies only to samples taken after fasting for at least 8 hours.  Glucose, capillary     Status: Abnormal   Collection Time: 01/30/20  4:29 AM  Result Value Ref Range   Glucose-Capillary 115 (H) 70 - 99 mg/dL    Comment: Glucose reference range applies only to samples taken after fasting for at least 8 hours.  Glucose, capillary     Status: Abnormal   Collection Time: 01/30/20  7:00 AM  Result Value Ref Range   Glucose-Capillary 118 (H) 70 - 99 mg/dL    Comment: Glucose reference range applies only to samples taken after fasting for at least 8 hours.  Glucose, capillary     Status: Abnormal   Collection Time: 01/30/20  7:40 AM  Result Value Ref Range   Glucose-Capillary 113 (H) 70 - 99 mg/dL    Comment: Glucose reference range applies only to samples taken after fasting for at least 8 hours.  Basic metabolic panel     Status: Abnormal   Collection Time: 01/30/20  7:43 AM  Result Value Ref Range   Sodium 126 (L) 135 - 145 mmol/L   Potassium 4.6 3.5 - 5.1 mmol/L   Chloride 95 (L) 98 - 111 mmol/L   CO2 17 (L) 22 - 32 mmol/L   Glucose, Bld 121 (H) 70 - 99 mg/dL    Comment: Glucose reference range applies  only to samples taken after fasting for at least 8 hours.   BUN 74 (H) 8 - 23 mg/dL   Creatinine, Ser 3.67 (H) 0.61 - 1.24 mg/dL   Calcium 8.1 (L) 8.9 - 10.3 mg/dL   GFR calc non Af Amer 14 (L) >60 mL/min   GFR calc Af Amer 16 (L) >60 mL/min   Anion gap 14 5 - 15    Comment: Performed at [facility] [address] [place], [place] [address]  Glucose, capillary     Status: Abnormal   Collection Time: 01/30/20 11:15 AM  Result Value Ref Range   Glucose-Capillary 171 (H) 70 - 99 mg/dL    Comment: Glucose reference range applies only to samples taken after fasting for at least 8 hours.     ROS:  A comprehensive review of systems was negative except for: Cardiovascular: positive for lower extremity edema Gastrointestinal: positive for nausea  Physical Exam: Vitals:   01/30/20 1100 01/30/20 1114  BP: 108/85   Pulse: 60   Resp: 20   Temp:  97.8 F (36.6 C)  SpO2: 100%      General: Well-appearing, looks younger than stated age, up in bedside chair HEENT: Pupils are equal round reactive to light, extraocular motions are intact, mucous membranes are moist Neck: Difficult to determine JVD Heart: Regular rate and rhythm Lungs: Decreased breath sounds at the extreme bases Abdomen: Soft, nontender, nondistended Extremities: Pitting edema and weeping lower extremities Skin: Other than lower extremities, warm and dry Neuro: Alert, nonfocal  Assessment/Plan: 86 year old white male with hypertension and baseline stage III CKD.  He is now suffered acute kidney injury in the setting of CABG done on 3/81/8299 complicated by hypotension 1.Renal-patient did have some baseline CKD with GFR of around 40.  He has now suffered AKI in the setting of CABG and postoperative hypotension.  I suspect  ATN.  Agree with efforts to attempt to increase blood pressure-he is on midodrine and really no blood pressure lowering agents.  His blood pressure has been better the last 24 hours.  However, I  suspect the insult has already occurred.  The plan now is for supportive care and hoping that the kidney function will plateau and improve with time.  There are no absolute indications for dialysis therapy at this time.  I have told him he is at risk for progressing to dialysis need this hospitalization.  My sense would be that it would be temporary and would not need to be continued.  He has appropriate questions regarding this.  I think that he would be a candidate for short-term dialysis support and he agrees.  I spoke with patient's son Bryan Dunn as well 2. Hypertension/volume  -hypotensive but volume overloaded.  I agree with midodrine.  I am going to start low-dose frequent Lasix to attempt to deal with his volume overload.  Would not give any more fluids per se but albumin is okay 3. Anemia  -not that significant at this time.  Continue monitoring and supportive therapy   Louis Meckel 01/30/2020, 12:09 PM

## 2020-01-30 NOTE — Progress Notes (Signed)
      [address]       [place] [address]             [phone]                 7 Days Post-Op Procedure(s) (LRB): CORONARY ARTERY BYPASS GRAFTING (CABG), ON PUMP, TIMES FOUR, USING LEFT INTERNAL MAMMARY ARTERY AND ENDOSCOPICALLY HARVESTED LEFT GREATER SAPHENOUS VEIN (N/A) TRANSESOPHAGEAL ECHOCARDIOGRAM (TEE) (N/A)   Events: No evnts _______________________________________________________________ Vitals: BP (!) 94/59 (BP Location: Right Arm)   Pulse (!) 51   Temp 98.5 F (36.9 C) (Oral)   Resp 15   Ht 5' 9.5" (1.765 m)   Wt 91.5 kg   SpO2 100%   BMI 29.36 kg/m   - Neuro: alert NAD  - Cardiovascular: sinus  Drips: none.   CVP:  [7 mmHg-8 mmHg] 7 mmHg  - Pulm: EWOB    ABG    Component Value Date/Time   PHART 7.396 01/23/2020 2244   PCO2ART 42.6 01/23/2020 2244   PO2ART 112 (H) 01/23/2020 2244   HCO3 26.2 01/23/2020 2244   TCO2 20 (L) 01/29/2020 0537   ACIDBASEDEF 4.0 (H) 01/23/2020 1333   O2SAT 98.0 01/23/2020 2244    - Abd: soft - Extremity: edematous  .Intake/Output      08/03 0701 - 08/04 0700 08/04 0701 - 08/05 0700   P.O.     I.V. (mL/kg) 79 (0.9)    IV Piggyback 152.3 70.6   Total Intake(mL/kg) 231.3 (2.5) 70.6 (0.8)   Urine (mL/kg/hr) 265 (0.1) 75 (0.1)   Stool 0 0   Chest Tube 340    Total Output 605 75   Net -373.7 -4.4        Stool Occurrence 1 x 1 x      _______________________________________________________________ Labs: CBC Latest Ref Rng & Units 01/29/2020 01/28/2020 01/27/2020  WBC 4.0 - 10.5 K/uL - 9.6 8.5  Hemoglobin 13.0 - 17.0 g/dL 9.2(L) 9.7(L) 9.6(L)  Hematocrit 39 - 52 % 27.0(L) 30.0(L) 29.2(L)  Platelets 150 - 400 K/uL - 173 149(L)   CMP Latest Ref Rng & Units 01/30/2020 01/29/2020 01/29/2020  Glucose 70 - 99 mg/dL 121(H) 138(H) 141(H)  BUN 8 - 23 mg/dL 74(H) 61(H) 65(H)  Creatinine 0.61 - 1.24 mg/dL 3.67(H) 3.00(H) 2.76(H)  Sodium 135 - 145 mmol/L 126(L) 129(L) 126(L)  Potassium 3.5 - 5.1 mmol/L 4.6 3.5  3.5  Chloride 98 - 111 mmol/L 95(L) 96(L) 97(L)  CO2 22 - 32 mmol/L 17(L) - 18(L)  Calcium 8.9 - 10.3 mg/dL 8.1(L) - 7.9(L)  Total Protein 6.5 - 8.1 g/dL - - -  Total Bilirubin 0.3 - 1.2 mg/dL - - -  Alkaline Phos 38 - 126 U/L - - -  AST 15 - 41 U/L - - -  ALT 0 - 44 U/L - - -    CXR: pending  _______________________________________________________________  Assessment and Plan: POD 7 s/p CABG now with acute renal injury, now with UTI  Neuro: pain controlled CV: off inotropes.  Echo shows improvement in LV function.  LA dilation Pulm: continue CT management and pulm toilet Renal: creat continues to go up.  Appreciate nephrology recs.   GI: on diet.  Nausea improved Heme: stabl ID: starting cipro for UTI Endo: SSi Dispo: continue ICU care  Melodie Bouillon, MD 01/30/2020 4:25 PM

## 2020-01-30 NOTE — Progress Notes (Signed)
Patient ID: Bryan Dunn, male   DOB: Nov 08, 1930, 86 y.o.   MRN: [id number] TCTS Evening Rounds:  Hemodynamically stable but BP 90's to 105 today. On midodrine 10 tid. Will dc Lopressor to avoid further hypotension. sats 99% RA  Remains oliguric with rising creat due to ATN. Nephrology saw him today and rec supportive care for now.

## 2020-01-30 NOTE — TOC Progression Note (Signed)
Transition of Care [facility]) - Progression Note    Patient Details  Name: Bryan Dunn MRN: [id number] Date of Birth: February 24, 1931  Transition of [facility]) CM/SW [place], [place] Phone Number: 01/30/2020, 3:12 PM  Clinical Narrative:     CSW spoke with patient and patients son Bryan Dunn at Bedside. CSW provided bed offers. Bryan Dunn patients son wants CSW to expand SNF search to any facility close to [place] area. CSW faxed out for more possible bed offers to provide patient. CSW will follow up with patient with additional bed offers when they come in. Patient has had both covid vaccines.  Pending SNF choice. CSW will start insurance authorization closer to being medically ready.  Expected Discharge Plan: [place] Barriers to Discharge: Continued Medical Work up  Expected Discharge Plan and Services Expected Discharge Plan: [place] arrangements for the past 2 months: Single Family Home                                       Social Determinants of Health (SDOH) Interventions    Readmission Risk Interventions No flowsheet data found.

## 2020-01-30 NOTE — Progress Notes (Signed)
Echocardiogram 2D Echocardiogram limited with definity has been performed.  Darlina Sicilian M 01/30/2020, 10:40 AM

## 2020-01-30 NOTE — Progress Notes (Signed)
Physical Therapy Treatment Patient Details Name: Bryan Dunn MRN: [id number] DOB: Jul 04, 1930 Today's Date: 01/30/2020    History of Present Illness Bryan Dunn is a 86 y.o. male with a hx of very remote CAD with PCI over 20 years ago in [place], HTN, HLD and neuropathy who presented to [facility] with complaints of chest pain, was positive for NSTEMI and cath demonstrated 3 vessel calcification, now s/p CABG x 4 on 01/23/20.    PT Comments    Pt pleasant and reports having slept all night and increasing appetite. Pt able to recall "move in the tube" but unable to recall all precautions with education for all and handout provided. Pt with improved transfers and gait tolerance with continued need for seated rest with gait. Educated for HEP and encouraged continued mobility as well as reinforcement of precautions.   HR 61-74 Pre gait 97/53 (67) Post gait 105/79 (84) SpO2 99% on RA during gait    Follow Up Recommendations  SNF;Supervision/Assistance - 24 hour     Equipment Recommendations  Rolling walker with 5" wheels;3in1 (PT)    Recommendations for Other Services       Precautions / Restrictions Precautions Precautions: Sternal;Fall Precaution Booklet Issued: Yes (comment) Precaution Comments: chest tube, weeping LLE Restrictions Weight Bearing Restrictions: Yes (sternal precautions)    Mobility  Bed Mobility               General bed mobility comments: seated in recliner upon arrival and end of session, education for bed mobility with handout  Transfers Overall transfer level: Needs assistance   Transfers: Sit to/from Stand Sit to Stand: Min assist         General transfer comment: min assist with rocking and momentum to stand from chair x 4 trials, cues for hand placement and safety  Ambulation/Gait Ambulation/Gait assistance: Min assist;+2 safety/equipment Gait Distance (Feet): 88 Feet Assistive device: Rolling walker (2 wheeled) Gait Pattern/deviations: Step-through  pattern;Decreased stride length;Trunk flexed;Decreased dorsiflexion - left   Gait velocity interpretation: 1.31 - 2.62 ft/sec, indicative of limited community ambulator General Gait Details: pt with decreased dorsiflexion LLE and decreased leg length. Pt able to walk 3 bouts of 60', 88', 64' respectively with seated rest of a few minutes after each trial. Chair follow   Stairs             Wheelchair Mobility    Modified Rankin (Stroke Patients Only)       Balance Overall balance assessment: Needs assistance   Sitting balance-Leahy Scale: Fair     Standing balance support: Bilateral upper extremity supported Standing balance-Leahy Scale: Poor Standing balance comment: bil UE support on RW for gait                            Cognition Arousal/Alertness: Awake/alert Behavior During Therapy: WFL for tasks assessed/performed Overall Cognitive Status: Impaired/Different from baseline Area of Impairment: Memory                     Memory: Decreased recall of precautions                Exercises General Exercises - Lower Extremity [place] Arc Quad: AROM;Both;Seated;20 reps Hip Flexion/Marching: AROM;Both;Seated;20 reps    General Comments        Pertinent Vitals/Pain Pain Score: 3  Pain Location: chest and legs Pain Descriptors / Indicators: Sore;Aching Pain Intervention(s): Limited activity within patient's tolerance;Repositioned;Premedicated before session    Home Living  Prior Function            PT Goals (current goals can now be found in the care plan section) Progress towards PT goals: Progressing toward goals    Frequency    Min 3X/week      PT Plan Current plan remains appropriate    Co-evaluation              AM-PAC PT "6 Clicks" Mobility   Outcome Measure  Help needed turning from your back to your side while in a flat bed without using bedrails?: A Little Help needed moving  from lying on your back to sitting on the side of a flat bed without using bedrails?: A Lot Help needed moving to and from a bed to a chair (including a wheelchair)?: A Little Help needed standing up from a chair using your arms (e.g., wheelchair or bedside chair)?: A Little Help needed to walk in hospital room?: A Little Help needed climbing 3-5 steps with a railing? : A Lot 6 Click Score: 16    End of Session Equipment Utilized During Treatment: Gait belt Activity Tolerance: Patient tolerated treatment well Patient left: with call bell/phone within reach;with nursing/sitter in room;with family/visitor present Nurse Communication: Mobility status PT Visit Diagnosis: Other abnormalities of gait and mobility (R26.89);Difficulty in walking, not elsewhere classified (R26.2);Muscle weakness (generalized) (M62.81)     Time: 6503-5465 PT Time Calculation (min) (ACUTE ONLY): 28 min  Charges:  $Gait Training: 8-22 mins $Therapeutic Exercise: 8-22 mins                     [place], PT Acute Rehabilitation Services Pager: [phone] Office: [place] 01/30/2020, 9:36 AM

## 2020-01-31 ENCOUNTER — Inpatient Hospital Stay (HOSPITAL_COMMUNITY): Payer: Medicare Other

## 2020-01-31 DIAGNOSIS — N179 Acute kidney failure, unspecified: Secondary | ICD-10-CM

## 2020-01-31 LAB — RENAL FUNCTION PANEL
Albumin: 3.2 g/dL — ABNORMAL LOW (ref 3.5–5.0)
Anion gap: 14 (ref 5–15)
BUN: 89 mg/dL — ABNORMAL HIGH (ref 8–23)
CO2: 17 mmol/L — ABNORMAL LOW (ref 22–32)
Calcium: 8 mg/dL — ABNORMAL LOW (ref 8.9–10.3)
Chloride: 95 mmol/L — ABNORMAL LOW (ref 98–111)
Creatinine, Ser: 4.11 mg/dL — ABNORMAL HIGH (ref 0.61–1.24)
GFR calc Af Amer: 14 mL/min — ABNORMAL LOW (ref >60)
GFR calc non Af Amer: 12 mL/min — ABNORMAL LOW (ref >60)
Glucose, Bld: 133 mg/dL — ABNORMAL HIGH (ref 70–99)
Phosphorus: 4.3 mg/dL (ref 2.5–4.6)
Potassium: 4.3 mmol/L (ref 3.5–5.1)
Sodium: 126 mmol/L — ABNORMAL LOW (ref 135–145)

## 2020-01-31 LAB — APTT: aPTT: 43 seconds — ABNORMAL HIGH (ref 24–36)

## 2020-01-31 LAB — HEPARIN LEVEL (UNFRACTIONATED): Heparin Unfractionated: >2.2 IU/mL — ABNORMAL HIGH (ref 0.30–0.70)

## 2020-01-31 LAB — GLUCOSE, CAPILLARY
Glucose-Capillary: 140 mg/dL — ABNORMAL HIGH (ref 70–99)
Glucose-Capillary: 138 mg/dL — ABNORMAL HIGH (ref 70–99)
Glucose-Capillary: 191 mg/dL — ABNORMAL HIGH (ref 70–99)
Glucose-Capillary: 151 mg/dL — ABNORMAL HIGH (ref 70–99)

## 2020-01-31 MED ORDER — FUROSEMIDE 10 MG/ML IJ SOLN
80.0000 mg | Freq: Four times a day (QID) | INTRAMUSCULAR | Status: DC
  Administered 2020-02-01 (×2): 80 mg via INTRAVENOUS
  Filled 2020-01-31 (×2): qty 8

## 2020-01-31 MED ORDER — HEPARIN (PORCINE) 25000 UT/250ML-% IV SOLN: 1250.0000 [IU]/h | INTRAVENOUS | Status: DC

## 2020-01-31 MED ORDER — HEPARIN (PORCINE) 25000 UT/250ML-% IV SOLN
950.0000 [IU]/h | INTRAVENOUS | Status: DC
  Administered 2020-01-31: 1000 [IU]/h via INTRAVENOUS
  Administered 2020-02-01: 900 [IU]/h via INTRAVENOUS
  Administered 2020-02-03 – 2020-02-04 (×2): 800 [IU]/h via INTRAVENOUS
  Filled 2020-01-31 (×4): qty 250

## 2020-01-31 NOTE — Plan of Care (Signed)
Pt s/p CABG x 4. Pt remains deconditioned and weak. Pt unable to walk far due to instability. Left leg noted to have serous leakage from incision sites.

## 2020-01-31 NOTE — Progress Notes (Addendum)
[address]       [place] [address]             [phone]                 8 Days Post-Op Procedure(s) (LRB): CORONARY ARTERY BYPASS GRAFTING (CABG), ON PUMP, TIMES FOUR, USING LEFT INTERNAL MAMMARY ARTERY AND ENDOSCOPICALLY HARVESTED LEFT GREATER SAPHENOUS VEIN (N/A) TRANSESOPHAGEAL ECHOCARDIOGRAM (TEE) (N/A)   Events: No evnts _______________________________________________________________ Vitals: BP 113/64   Pulse 60   Temp 97.7 F (36.5 C)   Resp 15   Ht 5' 9.5" (1.765 m)   Wt 89.7 kg   SpO2 100%   BMI 28.78 kg/m   - Neuro: alert NAD  - Cardiovascular: sinus  Drips: none.   CVP:  [10 mmHg-14 mmHg] 11 mmHg  - Pulm: EWOB    ABG    Component Value Date/Time   PHART 7.396 01/23/2020 2244   PCO2ART 42.6 01/23/2020 2244   PO2ART 112 (H) 01/23/2020 2244   HCO3 26.2 01/23/2020 2244   TCO2 20 (L) 01/29/2020 0537   ACIDBASEDEF 4.0 (H) 01/23/2020 1333   O2SAT 98.0 01/23/2020 2244    - Abd: soft - Extremity: edematous  .Intake/Output      08/04 0701 - 08/05 0700 08/05 0701 - 08/06 0700   P.O. 720    I.V. (mL/kg)     IV Piggyback 502.6    Total Intake(mL/kg) 1222.6 (13.4)    Urine (mL/kg/hr) 115 (0.1)    Stool 0    Chest Tube 850    Total Output 965    Net +257.6         Stool Occurrence 1 x       _______________________________________________________________ Labs: CBC Latest Ref Rng & Units 01/29/2020 01/28/2020 01/27/2020  WBC 4.0 - 10.5 K/uL - 9.6 8.5  Hemoglobin 13.0 - 17.0 g/dL 9.2(L) 9.7(L) 9.6(L)  Hematocrit 39 - 52 % 27.0(L) 30.0(L) 29.2(L)  Platelets 150 - 400 K/uL - 173 149(L)   CMP Latest Ref Rng & Units 01/31/2020 01/30/2020 01/29/2020  Glucose 70 - 99 mg/dL 133(H) 121(H) 138(H)  BUN 8 - 23 mg/dL 89(H) 74(H) 61(H)  Creatinine 0.61 - 1.24 mg/dL 4.11(H) 3.67(H) 3.00(H)  Sodium 135 - 145 mmol/L 126(L) 126(L) 129(L)  Potassium 3.5 - 5.1 mmol/L 4.3 4.6 3.5  Chloride 98 - 111 mmol/L 95(L) 95(L) 96(L)  CO2 22 - 32 mmol/L  17(L) 17(L) -  Calcium 8.9 - 10.3 mg/dL 8.0(L) 8.1(L) -  Total Protein 6.5 - 8.1 g/dL - - -  Total Bilirubin 0.3 - 1.2 mg/dL - - -  Alkaline Phos 38 - 126 U/L - - -  AST 15 - 41 U/L - - -  ALT 0 - 44 U/L - - -    CXR: pending  _______________________________________________________________  Assessment and Plan: POD 8 s/p CABG now with acute renal injury  Neuro: pain controlled CV: off inotropes.  Echo shows improvement in LV function.  LA dilation patient will need IV access given his central line is now postop day 8.  PICC line order has been placed but they have been on able to insert it. Pulm: continue CT management and pulm toilet Renal: creat continues to go up.  Nephrology plans to to initiate intermittent hemodialysis tomorrow.  Will place a Vas-Cath. GI: on diet.  Nausea improved Heme: stabl ID: starting cipro for UTI Endo: SSi Dispo: continue ICU care  Melodie Bouillon, MD 01/31/2020 8:52 AM

## 2020-01-31 NOTE — Progress Notes (Addendum)
ANTICOAGULATION CONSULT NOTE - Initial Consult  Pharmacy Consult for Eliquis >> Heparin Indication: large LA thrombus and recent atrial fibrillation  No Known Allergies  Patient Measurements: Height: 5' 9.5" (176.5 cm) Weight: 89.7 kg (197 lb 12 oz) IBW/kg (Calculated) : 71.85 Heparin Dosing Weight: 79 kg  Vital Signs: Temp: 97.7 F (36.5 C) (08/05 0727) Temp Source: Oral (08/05 0359) BP: 113/64 (08/05 0600) Pulse Rate: 60 (08/05 0700)  Labs: Recent Labs    01/29/20 0537 01/30/20 0743 01/31/20 0509  HGB 9.2*  --   --   HCT 27.0*  --   --   CREATININE 3.00*  2.76* 3.67* 4.11*    Estimated Creatinine Clearance: 14.1 mL/min (A) (by C-G formula based on SCr of 4.11 mg/dL (H)).   Medical History: Past Medical History:  Diagnosis Date  . Benign essential HTN   . CAD (coronary artery disease)    remote PCI > 20 years ago in [place]  . HLD (hyperlipidemia)   . Neuropathy     Assessment: 86 yo M presents with large left atrial thrombus and recent atrial fibrillation. Eliquis was started during this admission on 7/29. With worsening renal function and possible dialysis, pharmacy asked to switch to IV heparin.   Last dose of Eliquis given this AM at 0930. Baseline HL falsely elevated given recent Eliquis. Baseline aPTT 43. Dialysis catheter placed this morning - minimal bleeding. CBC low but stable.   Will start heparin >12 hours after last Eliquis dose and dose based on aPTT until levels correlate. Starting on lower rate given recent CABG.  Goal of Therapy:  APTT 66-86 sec Heparin level 0.3-0.7 units/ml Monitor platelets by anticoagulation protocol: Yes   Plan:  Stop Eliquis Start heparin infusion at 1000 units/hr at midnight tonight, no bolus Check 8-hr aPTT after starting heparin Monitor daily aPTT/HL, CBC, s/sx bleeding  Richardine Service, PharmD PGY2 Cardiology Pharmacy Resident Phone: [phone] 01/31/2020  10:20 AM  Please check [email or web address] for unit-specific  pharmacy phone numbers.

## 2020-01-31 NOTE — Progress Notes (Signed)
TCTS BRIEF SICU PROGRESS NOTE  8 Days Post-Op  S/P Procedure(s) (LRB): CORONARY ARTERY BYPASS GRAFTING (CABG), ON PUMP, TIMES FOUR, USING LEFT INTERNAL MAMMARY ARTERY AND ENDOSCOPICALLY HARVESTED LEFT GREATER SAPHENOUS VEIN (N/A) TRANSESOPHAGEAL ECHOCARDIOGRAM (TEE) (N/A)   Stable day.  Temporary HD cath in place HR 60's w/ stable BP Breathing comfortably on room air Low UOP  Plan: Continue current plan  Rexene Alberts, MD 01/31/2020 6:22 PM

## 2020-01-31 NOTE — Progress Notes (Signed)
Subjective:  No real c/o's- one SBP was 87 but the others have been OK-  Now no UOP-- does not feel urge-  Lasix was stopped for low BP's   Objective Vital signs in last 24 hours: Vitals:   01/31/20 0500 01/31/20 0600 01/31/20 0700 01/31/20 0727  BP: 112/64 113/64    Pulse: (!) 53 62 60   Resp: (!) 23 (!) 28 15   Temp:    97.7 F (36.5 C)  TempSrc:      SpO2: 95% 97% 100%   Weight:      Height:       Weight change:   Intake/Output Summary (Last 24 hours) at 01/31/2020 0736 Last data filed at 01/31/2020 0559 Gross per 24 hour  Intake 1222.64 ml  Output 965 ml  Net 257.64 ml    Assessment/Plan: 86 year old white male with hypertension and baseline stage III CKD.  He is now suffered acute kidney injury in the setting of CABG done on 0/25/4270 complicated by hypotension 1.Renal-  patient did have some baseline CKD -GFR of around 40.  He has now suffered AKI in the setting of CABG and postoperative hypotension.  I suspect  ATN.  Agree with efforts to attempt to increase blood pressure-he is on midodrine and really no blood pressure lowering agents.  His blood pressure has been better the last 48 hours.  However, I suspect the insult has already occurred.  The plan was for supportive care and hoping that the kidney function will plateau.  Unfortunately his BUN and crt have trended worse again and he is essentially anuric.  There are no absolute indications for dialysis therapy right now today but if not showing signs of improvement by tomorrow will need HD-  I would put this at a 80% probability.  Will discuss with CTS options for placing vascath.  I have discussed this with him.  My sense would be that it would be temporary and would not need to be continued.     2. Hypertension/volume  - soft BP but volume overloaded.  I agree with midodrine.  I am going to start lower-dose frequent Lasix to try and infuce some UOP  attempt to deal with his volume overload.  Would not give any more fluids  3.  Anemia  -not that significant at this time.  Continue monitoring and supportive therapy    Louis Meckel    Labs: Basic Metabolic Panel: Recent Labs  Lab 01/29/20 0537 01/30/20 0743 01/31/20 0509  NA 129*  126* 126* 126*  K 3.5  3.5 4.6 4.3  CL 96*  97* 95* 95*  CO2 18* 17* 17*  GLUCOSE 138*  141* 121* 133*  BUN 61*  65* 74* 89*  CREATININE 3.00*  2.76* 3.67* 4.11*  CALCIUM 7.9* 8.1* 8.0*  PHOS  --   --  4.3   Liver Function Tests: Recent Labs  Lab 01/31/20 0509  ALBUMIN 3.2*   No results for input(s): LIPASE, AMYLASE in the last 168 hours. No results for input(s): AMMONIA in the last 168 hours. CBC: Recent Labs  Lab 01/24/20 1620 01/24/20 1620 01/25/20 0240 01/25/20 0240 01/26/20 0433 01/26/20 0433 01/27/20 0521 01/28/20 0300 01/29/20 0537  WBC 8.7   < > 8.0   < > 8.8  --  8.5 9.6  --   HGB 9.2*   < > 8.3*   < > 10.1*   < > 9.6* 9.7* 9.2*  HCT 27.5*   < > 25.1*   < >  29.3*   < > 29.2* 30.0* 27.0*  MCV 95.5  --  95.8  --  91.0  --  95.7 96.5  --   PLT 120*   < > 120*   < > 142*  --  149* 173  --    < > = values in this interval not displayed.   Cardiac Enzymes: No results for input(s): CKTOTAL, CKMB, CKMBINDEX, TROPONINI in the last 168 hours. CBG: Recent Labs  Lab 01/30/20 1115 01/30/20 1506 01/30/20 1947 01/31/20 0357 01/31/20 0725  GLUCAP 171* 168* 190* 140* 138*    Iron Studies: No results for input(s): IRON, TIBC, TRANSFERRIN, FERRITIN in the last 72 hours. Studies/Results: ECHOCARDIOGRAM LIMITED  Result Date: 01/30/2020    ECHOCARDIOGRAM LIMITED REPORT   Patient Name:   Bryan Dunn Date of Exam: 01/30/2020 Medical Rec #:  [id number]    Height:       69.5 in Accession #:    [id number]   Weight:       201.7 lb Date of Birth:  [birth year]    BSA:          2.084 m Patient Age:    25 years     BP:           115/61 mmHg Patient Gender: M            HR:           63 bpm. Exam Location:  Inpatient Procedure: Limited Echo, Limited Color  Doppler and Cardiac Doppler Indications:    Cardiomyopathy-Ischemic 414.8 / I25.5  History:        Patient has prior history of Echocardiogram examinations. CAD;                 Risk Factors:Dyslipidemia and Hypertension.  Sonographer:    Darlina Sicilian RDCS Referring Phys: [id number] [place]  1. Left ventricular ejection fraction, by estimation, is 45 to 50%. The left ventricle has mildly decreased function. The left ventricle demonstrates regional wall motion abnormalities (see scoring diagram/findings for description). There is mild concentric left ventricular hypertrophy. Left ventricular diastolic parameters are consistent with Grade II diastolic dysfunction (pseudonormalization). Elevated left atrial pressure. There is moderate of the left ventricular, mid-apical anteroseptal wall and anterior wall.  2. Left atrial size was severely dilated.  3. The mitral valve is normal in structure.  4. The aortic valve is tricuspid. Aortic valve regurgitation is mild. Mild to moderate aortic valve sclerosis/calcification is present, without any evidence of aortic stenosis.  5. Aortic dilatation noted. There is borderline dilatation of the ascending aorta measuring 39 mm.  6. There is mildly elevated pulmonary artery systolic pressure.  7. The inferior vena cava is dilated in size with <50% respiratory variability, suggesting right atrial pressure of 15 mmHg. Comparison(s): Prior images reviewed side by side. The left ventricular function has improved. The left ventricular wall motion abnormality is improved. Conclusion(s)/Recommendation(s): Diminutive atrial "kick" on mitral annulus tissue Doppler and mitral inflow suggests left atrial mechanical failure or stunning (such as following recent episode of atrial fibrillation). FINDINGS  Left Ventricle: Left ventricular ejection fraction, by estimation, is 45 to 50%. The left ventricle has mildly decreased function. The left ventricle demonstrates regional  wall motion abnormalities. The left ventricular internal cavity size was normal in size. There is mild concentric left ventricular hypertrophy. Abnormal (paradoxical) septal motion consistent with post-operative status. Elevated left atrial pressure. Right Ventricle: There is mildly elevated pulmonary artery systolic pressure. The tricuspid regurgitant velocity is  2.04 m/s, and with an assumed right atrial pressure of 15 mmHg, the estimated right ventricular systolic pressure is 67.3 mmHg. Left Atrium: Left atrial size was severely dilated. Mitral Valve: The mitral valve is normal in structure. Mild mitral annular calcification. Tricuspid Valve: The tricuspid valve is normal in structure. Tricuspid valve regurgitation is trivial. Aortic Valve: The aortic valve is tricuspid. . There is moderate thickening and moderate calcification of the aortic valve. Aortic valve regurgitation is mild. Mild to moderate aortic valve sclerosis/calcification is present, without any evidence of aortic stenosis. There is moderate thickening of the aortic valve. There is moderate calcification of the aortic valve. Pulmonic Valve: The pulmonic valve was grossly normal. Pulmonic valve regurgitation is not visualized. Aorta: Aortic dilatation noted and the aortic root is normal in size and structure. There is borderline dilatation of the ascending aorta measuring 39 mm. Venous: The inferior vena cava is dilated in size with less than 50% respiratory variability, suggesting right atrial pressure of 15 mmHg. LEFT VENTRICLE PLAX 2D LVIDd:         4.63 cm      Diastology LVIDs:         3.70 cm      LV e' lateral:   12.90 cm/s LV PW:         1.27 cm      LV E/e' lateral: 7.4 LV IVS:        1.25 cm      LV e' medial:    4.03 cm/s                             LV E/e' medial:  23.7  LV Volumes (MOD) LV vol d, MOD A2C: 92.5 ml LV vol d, MOD A4C: 113.0 ml LV vol s, MOD A2C: 51.5 ml LV vol s, MOD A4C: 70.8 ml LV SV MOD A2C:     41.0 ml LV SV MOD A4C:      113.0 ml LV SV MOD BP:      42.2 ml LEFT ATRIUM         Index LA diam:    5.20 cm 2.50 cm/m  AORTIC VALVE LVOT Vmax:   86.20 cm/s LVOT Vmean:  52.800 cm/s LVOT VTI:    0.135 m MITRAL VALVE               TRICUSPID VALVE MV Area (PHT): 4.15 cm    TR Peak grad:   16.6 mmHg MV Decel Time: 183 msec    TR Vmax:        204.00 cm/s MV E velocity: 95.50 cm/s MV A velocity: 36.80 cm/s  SHUNTS MV E/A ratio:  2.60        Systemic VTI: 0.14 m Dani Gobble Croitoru MD Electronically signed by Sanda Klein MD Signature Date/Time: 01/30/2020/2:21:22 PM    Final    Medications: Infusions: . lactated ringers Stopped (01/28/20 0058)    Scheduled Medications: . amiodarone  400 mg Oral BID  . apixaban  5 mg Oral BID  . aspirin EC  81 mg Oral Daily   Or  . aspirin  81 mg Per Tube Daily  . atorvastatin  80 mg Oral Daily  . bisacodyl  10 mg Oral Daily   Or  . bisacodyl  10 mg Rectal Daily  . Chlorhexidine Gluconate Cloth  6 each Topical Daily  . ciprofloxacin  500 mg Oral Q24H  . docusate sodium  200 mg Oral  Daily  . ferrous gluconate  324 mg Oral Q breakfast  . insulin aspart  0-24 Units Subcutaneous TID WC  . melatonin  3 mg Oral QHS  . metoCLOPramide (REGLAN) injection  5 mg Intravenous Q8H  . midodrine  10 mg Oral TID WC  . multivitamin  1 tablet Oral BID  . pantoprazole  40 mg Oral Daily  . sodium chloride flush  10-40 mL Intracatheter Q12H  . sodium chloride flush  3 mL Intravenous Q12H    have reviewed scheduled and prn medications.  Physical Exam: General: NAD Heart: slightly brady Lungs: mostly clear Abdomen: soft, non tender Extremities: pitting edema    01/31/2020,7:36 AM  LOS: 10 days

## 2020-01-31 NOTE — Procedures (Addendum)
Central Venous Catheter Insertion Procedure Note  Bryan Dunn  [id number]  March 08, 1931  Date:01/31/20  Time:11:11 AM   Provider Performing:Quan  Alfonse Spruce   Procedure: Insertion of Non-tunneled Central Venous Catheter(36556)with US guidance ([id number])    Indication(s) Hemodialysis  Consent Risks of the procedure as well as the alternatives and risks of each were explained to the patient and/or caregiver.  Consent for the procedure was obtained and is signed in the bedside chart  Anesthesia Topical only with 1% lidocaine   Timeout Verified patient identification, verified procedure, site/side was marked, verified correct patient position, special equipment/implants available, medications/allergies/relevant history reviewed, required imaging and test results available.  Sterile Technique Maximal sterile technique including full sterile barrier drape, hand hygiene, sterile gown, sterile gloves, mask, hair covering, sterile ultrasound probe cover (if used).  Procedure Description Area of catheter insertion was cleaned with chlorhexidine and draped in sterile fashion.   With real-time ultrasound guidance a HD catheter was placed into the left internal jugular vein.  Nonpulsatile blood flow and easy flushing noted in all ports.  The catheter was sutured in place and sterile dressing applied.  Complications/Tolerance None; patient tolerated the procedure well. Chest X-ray is ordered to verify placement for internal jugular or subclavian cannulation.  Chest x-ray is not ordered for femoral cannulation.  EBL Minimal  Specimen(s) None  Bryan Gerold, DO My pager: [phone]    I was present and assisted with procedure  Johnsie Cancel, NP-C [facility] Contact / Pager information can be found on Amion  01/31/2020, 11:14 AM

## 2020-01-31 NOTE — Progress Notes (Addendum)
Uneventful overnight per RN report Pt reports subjective anxiety r/t possible need for dialysis which created mild difficulty getting to sleep; reporting sufficient rest through second half of shift  0700 Up to chair; not ready to walk  0800 Nephro MD added back q6h lasix (double yesterday's dose) in hopes of avoiding dialysis D/w TCTS 2/2 yesterday's hypotension; instructed to hold lasix, amio, and metoprolol until told otherwise. Will relay this to RPh  0900 One of pt's sons visiting at bedside; all questions answered. Education provided on diuresis, beta blockers, and dialysis.  1000 IV team consulted for PIV 2/2 weeping edema Trialysis catheter placement  1200 Bladder scan 200cc; pt unable to void spontaneously but denies discomfort. WCTM Difficulty managing cups and cutting food noted today during lunch. At breakfast, pt ate his pancake as one would a sandwich instead of using utensils; OT consulted. Pt refusing ambulation at this time.  1600 Pt continues to refuse ambulation; education provided. Introducer d/c'ed Two sons at bedside assisting with dinner. Spontaneous void w/o complaint  1800 Pt able to void spontaneously a second time

## 2020-02-01 LAB — APTT
aPTT: 89 seconds — ABNORMAL HIGH (ref 24–36)
aPTT: 105 seconds — ABNORMAL HIGH (ref 24–36)
aPTT: 84 seconds — ABNORMAL HIGH (ref 24–36)

## 2020-02-01 LAB — GLUCOSE, CAPILLARY
Glucose-Capillary: 141 mg/dL — ABNORMAL HIGH (ref 70–99)
Glucose-Capillary: 130 mg/dL — ABNORMAL HIGH (ref 70–99)
Glucose-Capillary: 124 mg/dL — ABNORMAL HIGH (ref 70–99)
Glucose-Capillary: 94 mg/dL (ref 70–99)

## 2020-02-01 LAB — RENAL FUNCTION PANEL
Albumin: 3.2 g/dL — ABNORMAL LOW (ref 3.5–5.0)
Anion gap: 14 (ref 5–15)
BUN: 95 mg/dL — ABNORMAL HIGH (ref 8–23)
CO2: 18 mmol/L — ABNORMAL LOW (ref 22–32)
Calcium: 8.6 mg/dL — ABNORMAL LOW (ref 8.9–10.3)
Chloride: 98 mmol/L (ref 98–111)
Creatinine, Ser: 3.85 mg/dL — ABNORMAL HIGH (ref 0.61–1.24)
GFR calc Af Amer: 15 mL/min — ABNORMAL LOW (ref >60)
GFR calc non Af Amer: 13 mL/min — ABNORMAL LOW (ref >60)
Glucose, Bld: 141 mg/dL — ABNORMAL HIGH (ref 70–99)
Phosphorus: 4.3 mg/dL (ref 2.5–4.6)
Potassium: 4.5 mmol/L (ref 3.5–5.1)
Sodium: 130 mmol/L — ABNORMAL LOW (ref 135–145)

## 2020-02-01 LAB — HEPATITIS B CORE ANTIBODY, TOTAL: Hep B Core Total Ab: NONREACTIVE

## 2020-02-01 LAB — CBC
HCT: 32 % — ABNORMAL LOW (ref 39.0–52.0)
Hemoglobin: 10.6 g/dL — ABNORMAL LOW (ref 13.0–17.0)
MCH: 30.9 pg (ref 26.0–34.0)
MCHC: 33.1 g/dL (ref 30.0–36.0)
MCV: 93.3 fL (ref 80.0–100.0)
Platelets: 354 10*3/uL (ref 150–400)
RBC: 3.43 MIL/uL — ABNORMAL LOW (ref 4.22–5.81)
RDW: 14.9 % (ref 11.5–15.5)
WBC: 12.5 10*3/uL — ABNORMAL HIGH (ref 4.0–10.5)
nRBC: 0 % (ref 0.0–0.2)

## 2020-02-01 LAB — HEPATITIS B SURFACE ANTIGEN: Hepatitis B Surface Ag: NONREACTIVE

## 2020-02-01 MED ORDER — SODIUM CHLORIDE 0.9 % IV SOLN: 100.0000 mL | INTRAVENOUS | Status: DC | PRN

## 2020-02-01 MED ORDER — LIDOCAINE-PRILOCAINE 2.5-2.5 % EX CREA
1.0000 "application " | TOPICAL_CREAM | CUTANEOUS | Status: DC | PRN
  Filled 2020-02-01: qty 5

## 2020-02-01 MED ORDER — ALBUMIN HUMAN 25 % IV SOLN
INTRAVENOUS | Status: AC
  Administered 2020-02-01: 25 g via INTRAVENOUS
  Filled 2020-02-01: qty 100

## 2020-02-01 MED ORDER — ALBUMIN HUMAN 25 % IV SOLN: 25.0000 g | Freq: Once | INTRAVENOUS | Status: AC

## 2020-02-01 MED ORDER — HEPARIN SODIUM (PORCINE) 1000 UNIT/ML IJ SOLN
INTRAMUSCULAR | Status: AC
  Administered 2020-02-01: 2800 [IU] via INTRAVENOUS_CENTRAL
  Filled 2020-02-01: qty 4

## 2020-02-01 MED ORDER — PENTAFLUOROPROP-TETRAFLUOROETH EX AERO: 1.0000 "application " | INHALATION_SPRAY | CUTANEOUS | Status: DC | PRN

## 2020-02-01 MED ORDER — CHLORHEXIDINE GLUCONATE CLOTH 2 % EX PADS
6.0000 | MEDICATED_PAD | Freq: Every day | CUTANEOUS | Status: DC
  Administered 2020-02-01: 6 via TOPICAL

## 2020-02-01 MED ORDER — ALTEPLASE 2 MG IJ SOLR: 2.0000 mg | Freq: Once | INTRAMUSCULAR | Status: DC | PRN

## 2020-02-01 MED ORDER — HEPARIN SODIUM (PORCINE) 1000 UNIT/ML DIALYSIS: 1000.0000 [IU] | INTRAMUSCULAR | Status: DC | PRN

## 2020-02-01 MED ORDER — LIDOCAINE HCL (PF) 1 % IJ SOLN: 5.0000 mL | INTRAMUSCULAR | Status: DC | PRN

## 2020-02-01 NOTE — Progress Notes (Signed)
Pt completed HD tx, Sys BP running 80'-90s. Albumin 25g given per Dr. Moshe Cipro, minimal effects noted. SBP remained mostly in 90s, pt asymptomatic. UF=0.5 liters net. Pt stable at present time.

## 2020-02-01 NOTE — Progress Notes (Signed)
Physical Therapy Treatment Patient Details Name: Bryan Dunn MRN: [id number] DOB: [birth year] Today's Date: 02/01/2020    History of Present Illness Mr. Wicklund is a 86 y.o. male with a hx of very remote CAD with PCI over 20 years ago in [place], HTN, HLD and neuropathy who presented to [facility] with complaints of chest pain, was positive for NSTEMI and cath demonstrated 3 vessel calcification, now s/p CABG x 4 on 01/23/20.    PT Comments    Pt pleasant and willing to walk stating he didn't walk yesterday due to fatigue with HD access placement. Pt with improved gait and distance prior to having to sit this session. Educated for all precautions, progression and gait with HEP performed. Pt with edema and weeping of LUE during session with dressing saturated and changed with RN present. Will continue to follow.   HR 77-88 SpO2 100% on RA 113/66 (79 ) before gait 113/98 (105 ) post gait    Follow Up Recommendations  SNF;Supervision/Assistance - 24 hour     Equipment Recommendations  Rolling walker with 5" wheels;3in1 (PT)    Recommendations for Other Services       Precautions / Restrictions Precautions Precautions: Sternal;Fall Precaution Comments: chest tube, weeping LLE and LUE Restrictions Weight Bearing Restrictions:  (sternal precautions)    Mobility  Bed Mobility               General bed mobility comments: seated in recliner upon arrival and end of session, education for bed mobility  Transfers Overall transfer level: Needs assistance   Transfers: Sit to/from Stand Sit to Stand: Min assist         General transfer comment: min assist with rocking and momentum to stand from chair x 2 trials, cues for hand placement and safety. Pt with difficulty scooting in chair and required max +2 for posterior scooting  Ambulation/Gait Ambulation/Gait assistance: Min assist;+2 safety/equipment Gait Distance (Feet): 100 Feet Assistive device: Rolling walker (2 wheeled) Gait  Pattern/deviations: Step-through pattern;Decreased stride length;Trunk flexed;Decreased dorsiflexion - left   Gait velocity interpretation: 1.31 - 2.62 ft/sec, indicative of limited community ambulator General Gait Details: pt with decreased dorsiflexion LLE and decreased leg length with flexion into LLE with stepping. Pt walked 100' x 2 trials with seated rest between trials.   Stairs             Wheelchair Mobility    Modified Rankin (Stroke Patients Only)       Balance Overall balance assessment: Needs assistance Sitting-balance support: Feet supported;No upper extremity supported Sitting balance-Leahy Scale: Poor Sitting balance - Comments: pt with left lean in sitting today   Standing balance support: Bilateral upper extremity supported Standing balance-Leahy Scale: Poor Standing balance comment: bil UE support on RW for gait                            Cognition Arousal/Alertness: Awake/alert Behavior During Therapy: WFL for tasks assessed/performed Overall Cognitive Status: Impaired/Different from baseline Area of Impairment: Memory                     Memory: Decreased recall of precautions         General Comments: pt remains unable to recall no lifting with education and reinforcement throughout session      Exercises General Exercises - Lower Extremity Zeiter Arc Quad: AROM;Both;Seated;20 reps Hip Flexion/Marching: AROM;Both;Seated;20 reps    General Comments  Pertinent Vitals/Pain Pain Score: 4  Pain Location: chest and legs Pain Descriptors / Indicators: Sore;Aching Pain Intervention(s): Limited activity within patient's tolerance;Monitored during session;Repositioned    Home Living                      Prior Function            PT Goals (current goals can now be found in the care plan section) Progress towards PT goals: Progressing toward goals    Frequency    Min 3X/week      PT Plan Current  plan remains appropriate    Co-evaluation              AM-PAC PT "6 Clicks" Mobility   Outcome Measure  Help needed turning from your back to your side while in a flat bed without using bedrails?: A Little Help needed moving from lying on your back to sitting on the side of a flat bed without using bedrails?: A Lot Help needed moving to and from a bed to a chair (including a wheelchair)?: A Little Help needed standing up from a chair using your arms (e.g., wheelchair or bedside chair)?: A Little Help needed to walk in hospital room?: A Little Help needed climbing 3-5 steps with a railing? : A Lot 6 Click Score: 16    End of Session Equipment Utilized During Treatment: Gait belt Activity Tolerance: Patient tolerated treatment well Patient left: with call bell/phone within reach;with nursing/sitter in room;with family/visitor present Nurse Communication: Mobility status PT Visit Diagnosis: Other abnormalities of gait and mobility (R26.89);Difficulty in walking, not elsewhere classified (R26.2);Muscle weakness (generalized) (M62.81)     Time: 4627-0350 PT Time Calculation (min) (ACUTE ONLY): 24 min  Charges:  $Gait Training: 8-22 mins $Therapeutic Exercise: 8-22 mins                     Jhalil Silvera P, PT Acute Rehabilitation Services Pager: [phone] Office: [place] 02/01/2020, 11:21 AM

## 2020-02-01 NOTE — TOC Progression Note (Addendum)
Transition of Care [facility]) - Progression Note    Patient Details  Name: Elpidio Thielen MRN: [id number] Date of Birth: 06/16/1931  Transition of Care [facility]) CM/SW [place], [place] Phone Number: 02/01/2020, 11:56 AM  Clinical Narrative:     CSW spoke with patient at bedside and provided SNF bed offers. Patient wants to look over medicare compare list of bed offers accepted that CSW provided. Patient told CSW he is having a procedure this afternoon. Patient requested CSW come back by tomorrow morning to give SNF choice. TOC team will see patient tomorrow morning for SNF choice. CSW chatted Garth Bigness Renal Navigator to let her know that patient is a new HD.   SNF choice pending. CSW will need to start insurance authorization when patient is getting closer to DC.  TOC team will continue to follow.    Expected Discharge Plan: [place] Barriers to Discharge: Continued Medical Work up  Expected Discharge Plan and Services Expected Discharge Plan: [place] arrangements for the past 2 months: Single Family Home                                       Social Determinants of Health (SDOH) Interventions    Readmission Risk Interventions No flowsheet data found.

## 2020-02-01 NOTE — Progress Notes (Signed)
Subjective:  No real c/o's- nursing has told me that he is refusig some mobility work UOP is up (380 last 24 hours)-  crt is down but BUN is up now to close to 100-  Appreciate CTS placing HD cath  Objective Vital signs in last 24 hours: Vitals:   02/01/20 0635 02/01/20 0700 02/01/20 0800 02/01/20 0802  BP:  119/75 118/72   Pulse:  89 74   Resp:  17 20   Temp: 97.8 F (36.6 C)   98 F (36.7 C)  TempSrc: Oral   Oral  SpO2:  92% 98%   Weight:      Height:       Weight change:   Intake/Output Summary (Last 24 hours) at 02/01/2020 0817 Last data filed at 02/01/2020 0800 Gross per 24 hour  Intake 567.3 ml  Output 810 ml  Net -242.7 ml    Assessment/Plan: 86 year old white male with hypertension and baseline stage III CKD.  He has now suffered acute kidney injury in the setting of CABG done on 4/53/6468 complicated by hypotension 1.Renal-  patient did have some baseline CKD -GFR of around 40.  He has now suffered AKI in the setting of CABG and postoperative hypotension.  I suspect  ATN.  Agree with efforts to attempt to increase blood pressure-he is on midodrine and really no blood pressure lowering agents.  His blood pressure has been better the last 48 hours and UOP is picking up from nothing.  However, I suspect the insult has already occurred.  The plan was for supportive care.  Unfortunately his BUN has trended worse again.   I have discussed this with him.  since there may be some failure to thrive per nursing and have an access will go ahead and proceed with an HD treatment today to provide clearance and a little UF-  Then assess need on a daily basis.      2. Hypertension/volume  - soft BP but volume overloaded.  I agree with midodrine- has been increased to max dose.  I am going to continue lower-dose frequent Lasix to try and induce some UOP  attempt to deal with his volume overload.  Would not give any more fluids  3. Anemia  -not that significant at this time.  Continue monitoring and  supportive therapy 4.  Hyponatremia-  Due to volume overload-  HD should help     Louis Meckel    Labs: Basic Metabolic Panel: Recent Labs  Lab 01/30/20 0743 01/31/20 0509 02/01/20 0609  NA 126* 126* 130*  K 4.6 4.3 4.5  CL 95* 95* 98  CO2 17* 17* 18*  GLUCOSE 121* 133* 141*  BUN 74* 89* 95*  CREATININE 3.67* 4.11* 3.85*  CALCIUM 8.1* 8.0* 8.6*  PHOS  --  4.3 4.3   Liver Function Tests: Recent Labs  Lab 01/31/20 0509 02/01/20 0609  ALBUMIN 3.2* 3.2*   No results for input(s): LIPASE, AMYLASE in the last 168 hours. No results for input(s): AMMONIA in the last 168 hours. CBC: Recent Labs  Lab 01/26/20 0433 01/26/20 0433 01/27/20 0521 01/27/20 0521 01/28/20 0300 01/29/20 0537 02/01/20 0609  WBC 8.8   < > 8.5  --  9.6  --  12.5*  HGB 10.1*   < > 9.6*   < > 9.7* 9.2* 10.6*  HCT 29.3*   < > 29.2*   < > 30.0* 27.0* 32.0*  MCV 91.0  --  95.7  --  96.5  --  93.3  PLT 142*   < > 149*  --  173  --  354   < > = values in this interval not displayed.   Cardiac Enzymes: No results for input(s): CKTOTAL, CKMB, CKMBINDEX, TROPONINI in the last 168 hours. CBG: Recent Labs  Lab 01/31/20 0725 01/31/20 1104 01/31/20 1507 02/01/20 0625 02/01/20 0810  GLUCAP 138* 191* 151* 141* 130*    Iron Studies: No results for input(s): IRON, TIBC, TRANSFERRIN, FERRITIN in the last 72 hours. Studies/Results: DG CHEST PORT 1 VIEW  Result Date: 01/31/2020 CLINICAL DATA:  Line placement. EXAM: PORTABLE CHEST 1 VIEW COMPARISON:  January 28, 2020. FINDINGS: Stable cardiomegaly. Status post coronary bypass graft. Right internal jugular catheter is unchanged in position. Interval placement of left internal jugular catheter with distal tip in expected position of the SVC. Left-sided chest tube is noted without pneumothorax. Probable mild bibasilar subsegmental atelectasis is noted. Bony thorax is unremarkable. IMPRESSION: Interval placement of left internal jugular catheter with distal  tip in expected position of the SVC. Left-sided chest tube is noted without pneumothorax. Probable mild bibasilar subsegmental atelectasis. Electronically Signed   By: Marijo Conception M.D.   On: 01/31/2020 11:28   ECHOCARDIOGRAM LIMITED  Result Date: 01/30/2020    ECHOCARDIOGRAM LIMITED REPORT   Patient Name:   HEWITT Spinner Date of Exam: 01/30/2020 Medical Rec #:  [id number]    Height:       69.5 in Accession #:    [id number]   Weight:       201.7 lb Date of Birth:  [birth year]    BSA:          2.084 m Patient Age:    86 years     BP:           115/61 mmHg Patient Gender: M            HR:           63 bpm. Exam Location:  Inpatient Procedure: Limited Echo, Limited Color Doppler and Cardiac Doppler Indications:    Cardiomyopathy-Ischemic 414.8 / I25.5  History:        Patient has prior history of Echocardiogram examinations. CAD;                 Risk Factors:Dyslipidemia and Hypertension.  Sonographer:    Darlina Sicilian RDCS Referring Phys: [id number] [place]  1. Left ventricular ejection fraction, by estimation, is 45 to 50%. The left ventricle has mildly decreased function. The left ventricle demonstrates regional wall motion abnormalities (see scoring diagram/findings for description). There is mild concentric left ventricular hypertrophy. Left ventricular diastolic parameters are consistent with Grade II diastolic dysfunction (pseudonormalization). Elevated left atrial pressure. There is moderate of the left ventricular, mid-apical anteroseptal wall and anterior wall.  2. Left atrial size was severely dilated.  3. The mitral valve is normal in structure.  4. The aortic valve is tricuspid. Aortic valve regurgitation is mild. Mild to moderate aortic valve sclerosis/calcification is present, without any evidence of aortic stenosis.  5. Aortic dilatation noted. There is borderline dilatation of the ascending aorta measuring 39 mm.  6. There is mildly elevated pulmonary artery systolic pressure.   7. The inferior vena cava is dilated in size with <50% respiratory variability, suggesting right atrial pressure of 15 mmHg. Comparison(s): Prior images reviewed side by side. The left ventricular function has improved. The left ventricular wall motion abnormality is improved. Conclusion(s)/Recommendation(s): Diminutive atrial "kick" on mitral annulus tissue Doppler and mitral inflow suggests  left atrial mechanical failure or stunning (such as following recent episode of atrial fibrillation). FINDINGS  Left Ventricle: Left ventricular ejection fraction, by estimation, is 45 to 50%. The left ventricle has mildly decreased function. The left ventricle demonstrates regional wall motion abnormalities. The left ventricular internal cavity size was normal in size. There is mild concentric left ventricular hypertrophy. Abnormal (paradoxical) septal motion consistent with post-operative status. Elevated left atrial pressure. Right Ventricle: There is mildly elevated pulmonary artery systolic pressure. The tricuspid regurgitant velocity is 2.04 m/s, and with an assumed right atrial pressure of 15 mmHg, the estimated right ventricular systolic pressure is 09.6 mmHg. Left Atrium: Left atrial size was severely dilated. Mitral Valve: The mitral valve is normal in structure. Mild mitral annular calcification. Tricuspid Valve: The tricuspid valve is normal in structure. Tricuspid valve regurgitation is trivial. Aortic Valve: The aortic valve is tricuspid. . There is moderate thickening and moderate calcification of the aortic valve. Aortic valve regurgitation is mild. Mild to moderate aortic valve sclerosis/calcification is present, without any evidence of aortic stenosis. There is moderate thickening of the aortic valve. There is moderate calcification of the aortic valve. Pulmonic Valve: The pulmonic valve was grossly normal. Pulmonic valve regurgitation is not visualized. Aorta: Aortic dilatation noted and the aortic root is  normal in size and structure. There is borderline dilatation of the ascending aorta measuring 39 mm. Venous: The inferior vena cava is dilated in size with less than 50% respiratory variability, suggesting right atrial pressure of 15 mmHg. LEFT VENTRICLE PLAX 2D LVIDd:         4.63 cm      Diastology LVIDs:         3.70 cm      LV e' lateral:   12.90 cm/s LV PW:         1.27 cm      LV E/e' lateral: 7.4 LV IVS:        1.25 cm      LV e' medial:    4.03 cm/s                             LV E/e' medial:  23.7  LV Volumes (MOD) LV vol d, MOD A2C: 92.5 ml LV vol d, MOD A4C: 113.0 ml LV vol s, MOD A2C: 51.5 ml LV vol s, MOD A4C: 70.8 ml LV SV MOD A2C:     41.0 ml LV SV MOD A4C:     113.0 ml LV SV MOD BP:      42.2 ml LEFT ATRIUM         Index LA diam:    5.20 cm 2.50 cm/m  AORTIC VALVE LVOT Vmax:   86.20 cm/s LVOT Vmean:  52.800 cm/s LVOT VTI:    0.135 m MITRAL VALVE               TRICUSPID VALVE MV Area (PHT): 4.15 cm    TR Peak grad:   16.6 mmHg MV Decel Time: 183 msec    TR Vmax:        204.00 cm/s MV E velocity: 95.50 cm/s MV A velocity: 36.80 cm/s  SHUNTS MV E/A ratio:  2.60        Systemic VTI: 0.14 m Dani Gobble Croitoru MD Electronically signed by Sanda Klein MD Signature Date/Time: 01/30/2020/2:21:22 PM    Final    Medications: Infusions: . heparin 900 Units/hr (02/01/20 0800)  . lactated ringers Stopped (01/28/20 0058)  Scheduled Medications: . amiodarone  400 mg Oral BID  . aspirin EC  81 mg Oral Daily   Or  . aspirin  81 mg Per Tube Daily  . atorvastatin  80 mg Oral Daily  . bisacodyl  10 mg Oral Daily   Or  . bisacodyl  10 mg Rectal Daily  . Chlorhexidine Gluconate Cloth  6 each Topical Daily  . ciprofloxacin  500 mg Oral Q24H  . docusate sodium  200 mg Oral Daily  . ferrous gluconate  324 mg Oral Q breakfast  . furosemide  80 mg Intravenous Q6H  . insulin aspart  0-24 Units Subcutaneous TID WC  . melatonin  3 mg Oral QHS  . metoCLOPramide (REGLAN) injection  5 mg Intravenous Q8H  .  midodrine  10 mg Oral TID WC  . multivitamin  1 tablet Oral BID  . pantoprazole  40 mg Oral Daily  . sodium chloride flush  10-40 mL Intracatheter Q12H  . sodium chloride flush  3 mL Intravenous Q12H    have reviewed scheduled and prn medications.  Physical Exam: General: NAD Heart: slightly brady Lungs: mostly clear Abdomen: soft, non tender Extremities: pitting edema    02/01/2020,8:17 AM  LOS: 11 days

## 2020-02-01 NOTE — Progress Notes (Signed)
Per nightshift report overnight:  pt continues refusing ambulation Gait preference to the left with ambulation (pt found leaning to the left in chair at shift change)  0900 Good workout with PT (see separate note) Therapist encouraging mobility on off days  1200 OT consult (see separate note)  1600 HD at bedside (see separate note) Net 500cc off  1800 Son's at bedside visiting. Pt alert and feeling well after HD.

## 2020-02-01 NOTE — Progress Notes (Signed)
ANTICOAGULATION CONSULT NOTE - [place] for Eliquis >> Heparin Indication: large LA thrombus and recent atrial fibrillation  No Known Allergies  Patient Measurements: Height: 5' 9.5" (176.5 cm) Weight: 89.1 kg (196 lb 6.9 oz) IBW/kg (Calculated) : 71.85 Heparin Dosing Weight: 79 kg  Vital Signs: Temp: 98 F (36.7 C) (08/06 1526) Temp Source: Oral (08/06 1526) BP: 101/70 (08/06 1600) Pulse Rate: 77 (08/06 1600)  Labs: Recent Labs    01/30/20 0743 01/31/20 0509 01/31/20 1133 01/31/20 1133 02/01/20 0609 02/01/20 1546 02/01/20 1719  HGB  --   --   --   --  10.6*  --   --   HCT  --   --   --   --  32.0*  --   --   PLT  --   --   --   --  354  --   --   APTT  --   --  43*   < > 89* 105* 84*  HEPARINUNFRC  --   --  >2.20*  --   --   --   --   CREATININE 3.67* 4.11*  --   --  3.85*  --   --    < > = values in this interval not displayed.    Estimated Creatinine Clearance: 15.1 mL/min (A) (by C-G formula based on SCr of 3.85 mg/dL (H)).   Medical History: Past Medical History:  Diagnosis Date  . Benign essential HTN   . CAD (coronary artery disease)    remote PCI > 20 years ago in [place]  . HLD (hyperlipidemia)   . Neuropathy     Assessment: 86 yo M presents with large left atrial thrombus and recent atrial fibrillation. Eliquis was started during this admission on 7/29. With worsening renal function and possible dialysis, pharmacy asked to switch to IV heparin. Last dose of Eliquis given 8/5 AM. Baseline HL falsely elevated given recent Eliquis. Baseline aPTT 43.  aPTT  therapeutic at 84 (previous aPTT of 105 likely aberrant as drawn through a hep locked HD cath). CBC stable.  Will continue to dose based on aPTT until aPTT and HL correlate.  Goal of Therapy:  APTT 66-86 sec Heparin level 0.3-0.7 units/ml Monitor platelets by anticoagulation protocol: Yes   Plan:  Continue heparin infusion at 900 units/hr Monitor daily aPTT/HL, CBC, s/sx  bleeding  Alanda Slim, PharmD, [facility] Clinical Pharmacist Please see AMION for all Pharmacists' Contact Phone Numbers 02/01/2020, 6:05 PM

## 2020-02-01 NOTE — Progress Notes (Signed)
ANTICOAGULATION CONSULT NOTE - Follow-Up Consult  Pharmacy Consult for Eliquis >> Heparin Indication: large LA thrombus and recent atrial fibrillation  No Known Allergies  Patient Measurements: Height: 5' 9.5" (176.5 cm) Weight: 89.1 kg (196 lb 6.9 oz) IBW/kg (Calculated) : 71.85 Heparin Dosing Weight: 79 kg  Vital Signs: Temp: 98 F (36.7 C) (08/06 0802) Temp Source: Oral (08/06 0802) BP: 118/72 (08/06 0800) Pulse Rate: 74 (08/06 0800)  Labs: Recent Labs    01/30/20 0743 01/31/20 0509 01/31/20 1133 02/01/20 0609  HGB  --   --   --  10.6*  HCT  --   --   --  32.0*  PLT  --   --   --  354  APTT  --   --  43* 89*  HEPARINUNFRC  --   --  >2.20*  --   CREATININE 3.67* 4.11*  --  3.85*    Estimated Creatinine Clearance: 15.1 mL/min (A) (by C-G formula based on SCr of 3.85 mg/dL (H)).   Medical History: Past Medical History:  Diagnosis Date  . Benign essential HTN   . CAD (coronary artery disease)    remote PCI > 20 years ago in [place]  . HLD (hyperlipidemia)   . Neuropathy     Assessment: 86 yo M presents with large left atrial thrombus and recent atrial fibrillation. Eliquis was started during this admission on 7/29. With worsening renal function and possible dialysis, pharmacy asked to switch to IV heparin. Last dose of Eliquis given 8/5 AM. Baseline HL falsely elevated given recent Eliquis. Baseline aPTT 43.  Initial aPTT slightly supratherapeutic at 89 after starting heparin infusion at 1000 units/hr at 2310 last night. CBC stable. No overt bleeding or infusion issues per RN. Patient for iHD today. Will continue to dose based on aPTT until aPTT and HL correlate.  Goal of Therapy:  APTT 66-86 sec Heparin level 0.3-0.7 units/ml Monitor platelets by anticoagulation protocol: Yes   Plan:  Decrease heparin infusion to 900 units/hr Check 8-hr aPTT  Monitor daily aPTT/HL, CBC, s/sx bleeding  Richardine Service, PharmD PGY2 Cardiology Pharmacy Resident Phone:  [phone] 02/01/2020  10:37 AM  Please check [email or web address] for unit-specific pharmacy phone numbers.

## 2020-02-01 NOTE — Progress Notes (Signed)
° °   °  [address]       [place] [address]             [phone]      POD # 9  Tolerated HD this afternoon BP (!) 104/51 (BP Location: Right Arm)    Pulse 79    Temp 98 F (36.7 C) (Oral)    Resp 18    Ht 5' 9.5" (1.765 m)    Wt 89.1 kg    SpO2 98%    BMI 28.59 kg/m   Intake/Output Summary (Last 24 hours) at 02/01/2020 1838 Last data filed at 02/01/2020 1800 Gross per 24 hour  Intake 517.33 ml  Output 2300 ml  Net -1782.67 ml   UO has increased today  Remo Lipps C. Roxan Hockey, MD [facility] [phone]

## 2020-02-01 NOTE — Progress Notes (Signed)
      [address]       [place] [address]             [phone]                 9 Days Post-Op Procedure(s) (LRB): CORONARY ARTERY BYPASS GRAFTING (CABG), ON PUMP, TIMES FOUR, USING LEFT INTERNAL MAMMARY ARTERY AND ENDOSCOPICALLY HARVESTED LEFT GREATER SAPHENOUS VEIN (N/A) TRANSESOPHAGEAL ECHOCARDIOGRAM (TEE) (N/A)   Events: Feels better today. _______________________________________________________________ Vitals: BP (!) 99/57   Pulse 74   Temp 98.8 F (37.1 C) (Oral)   Resp 17   Ht 5' 9.5" (1.765 m)   Wt 89.1 kg   SpO2 97%   BMI 28.59 kg/m   - Neuro: alert NAD  - Cardiovascular: sinus  Drips: none.      - Pulm: EWOB    ABG    Component Value Date/Time   PHART 7.396 01/23/2020 2244   PCO2ART 42.6 01/23/2020 2244   PO2ART 112 (H) 01/23/2020 2244   HCO3 26.2 01/23/2020 2244   TCO2 20 (L) 01/29/2020 0537   ACIDBASEDEF 4.0 (H) 01/23/2020 1333   O2SAT 98.0 01/23/2020 2244    - Abd: soft - Extremity: edematous  .Intake/Output      08/05 0701 - 08/06 0700 08/06 0701 - 08/07 0700   P.O. 720 240   I.V. (mL/kg) 78.1 (0.9) 36.2 (0.4)   IV Piggyback     Total Intake(mL/kg) 798.1 (9) 276.2 (3.1)   Urine (mL/kg/hr) 380 (0.2) 1150 (1.8)   Stool     Chest Tube 430 200   Total Output 810 1350   Net -11.9 -1073.8           _______________________________________________________________ Labs: CBC Latest Ref Rng & Units 02/01/2020 01/29/2020 01/28/2020  WBC 4.0 - 10.5 K/uL 12.5(H) - 9.6  Hemoglobin 13.0 - 17.0 g/dL 10.6(L) 9.2(L) 9.7(L)  Hematocrit 39 - 52 % 32.0(L) 27.0(L) 30.0(L)  Platelets 150 - 400 K/uL 354 - 173   CMP Latest Ref Rng & Units 02/01/2020 01/31/2020 01/30/2020  Glucose 70 - 99 mg/dL 141(H) 133(H) 121(H)  BUN 8 - 23 mg/dL 95(H) 89(H) 74(H)  Creatinine 0.61 - 1.24 mg/dL 3.85(H) 4.11(H) 3.67(H)  Sodium 135 - 145 mmol/L 130(L) 126(L) 126(L)  Potassium 3.5 - 5.1 mmol/L 4.5 4.3 4.6  Chloride 98 - 111 mmol/L 98 95(L) 95(L)  CO2 22 -  32 mmol/L 18(L) 17(L) 17(L)  Calcium 8.9 - 10.3 mg/dL 8.6(L) 8.0(L) 8.1(L)  Total Protein 6.5 - 8.1 g/dL - - -  Total Bilirubin 0.3 - 1.2 mg/dL - - -  Alkaline Phos 38 - 126 U/L - - -  AST 15 - 41 U/L - - -  ALT 0 - 44 U/L - - -    CXR: pending  _______________________________________________________________  Assessment and Plan: POD 9 s/p CABG now with acute renal injury  Neuro: pain controlled CV: Blood pressure improved remains on midodrine.   Pulm: continue CT management and pulm toilet Renal: Creatinine trending down plan is still for dialysis today.   GI: on diet.  Nausea improved Heme: stabl ID: starting cipro for UTI Endo: SSi Dispo: We will transfer to floor after dialysis.  Melodie Bouillon, MD 02/01/2020 2:20 PM

## 2020-02-01 NOTE — Progress Notes (Signed)
Pt able to stand at sink for 2 grooming activities with min to min guard assist. Pt able to adhere to sternal precautions during mobility, but having difficulty recalling weight limit. Pt choosing to remain up in chair at end of session. Demonstrating L side lean in sitting, reports he has a hx of radiating back pain and suspects he may he attempting to unload weight.    02/01/20 1200  OT Visit Information  Last OT Received On 02/01/20  Assistance Needed +2 (chair follow)  History of Present Illness Bryan Dunn is a 86 y.o. male with a hx of very remote CAD with PCI over 20 years ago in [place], HTN, HLD and neuropathy who presented to [facility] with complaints of chest pain, was positive for NSTEMI and cath demonstrated 3 vessel calcification, now s/p CABG x 4 on 01/23/20.  Precautions  Precautions Sternal;Fall  Precaution Booklet Issued Yes (comment)  Precaution Comments pt needing cues for weight limit  Pain Assessment  Pain Assessment Faces  Faces Pain Scale 4  Pain Location chest and legs  Pain Descriptors / Indicators Sore;Aching  Pain Intervention(s) Monitored during session;Repositioned  Cognition  Arousal/Alertness Awake/alert  Behavior During Therapy WFL for tasks assessed/performed  Overall Cognitive Status Impaired/Different from baseline  Area of Impairment Memory  Memory Decreased recall of precautions  ADL  Overall ADL's  Needs assistance/impaired  Grooming Oral care;Standing;Min guard;Wash/dry hands  Upper Body Dressing  Sitting;Minimal assistance  Upper Body Dressing Details (indicate cue type and reason) front opening gown  Functional mobility during ADLs Minimal assistance;Rolling walker;+2 for safety/equipment  General ADL Comments focus of session on increasing standing tolerance for grooming at sink  Bed Mobility  General bed mobility comments seated and returned to recliner  Balance  Overall balance assessment Needs assistance  Sitting balance-Leahy Scale Poor  Sitting  balance - Comments pt with left lean in sitting today  Standing balance support Bilateral upper extremity supported  Standing balance-Leahy Scale Poor  Standing balance comment at least one hand support or leaning on sink during grooming  Transfers  Overall transfer level Needs assistance  Equipment used Rolling walker (2 wheeled)  Transfers Sit to/from Stand  Sit to Stand Min assist  General transfer comment min assist with rocking and momentum to stand from chair x 2 trials, cues for hand placement and safety. Pt with difficulty scooting in chair and required max +2 for posterior scooting  OT - End of Session  Equipment Utilized During Treatment Gait belt;Rolling walker  Activity Tolerance Patient tolerated treatment well  Patient left in chair;with call bell/phone within reach  OT Assessment/Plan  OT Plan Discharge plan remains appropriate  OT Visit Diagnosis Unsteadiness on feet (R26.81);Muscle weakness (generalized) (M62.81);Pain  OT Frequency (ACUTE ONLY) Min 2X/week  Follow Up Recommendations SNF;Supervision/Assistance - 24 hour  OT Equipment 3 in 1 bedside commode  AM-PAC OT "6 Clicks" Daily Activity Outcome Measure (Version 2)  Help from another person eating meals? 4  Help from another person taking care of personal grooming? 3  Help from another person toileting, which includes using toliet, bedpan, or urinal? 2  Help from another person bathing (including washing, rinsing, drying)? 2  Help from another person to put on and taking off regular upper body clothing? 3  Help from another person to put on and taking off regular lower body clothing? 2  6 Click Score 16  OT Goal Progression  Progress towards OT goals Progressing toward goals  Acute Rehab OT Goals  Patient Stated Goal  to get back home  OT Goal Formulation With patient  Time For Goal Achievement 02/10/20  Potential to Achieve Goals Good  OT Time Calculation  OT Start Time (ACUTE ONLY) 1103  OT Stop Time (ACUTE  ONLY) 1118  OT Time Calculation (min) 15 min  OT General Charges  $OT Visit 1 Visit  OT Treatments  $Self Care/Home Management  8-22 mins  Nestor Lewandowsky, OTR/L Acute Rehabilitation Services Pager: [phone] Office: [phone]

## 2020-02-02 LAB — RENAL FUNCTION PANEL
Albumin: 3 g/dL — ABNORMAL LOW (ref 3.5–5.0)
Anion gap: 17 — ABNORMAL HIGH (ref 5–15)
BUN: 68 mg/dL — ABNORMAL HIGH (ref 8–23)
CO2: 20 mmol/L — ABNORMAL LOW (ref 22–32)
Calcium: 8.5 mg/dL — ABNORMAL LOW (ref 8.9–10.3)
Chloride: 99 mmol/L (ref 98–111)
Creatinine, Ser: 2.75 mg/dL — ABNORMAL HIGH (ref 0.61–1.24)
GFR calc Af Amer: 23 mL/min — ABNORMAL LOW (ref >60)
GFR calc non Af Amer: 20 mL/min — ABNORMAL LOW (ref >60)
Glucose, Bld: 149 mg/dL — ABNORMAL HIGH (ref 70–99)
Phosphorus: 3.4 mg/dL (ref 2.5–4.6)
Potassium: 3.8 mmol/L (ref 3.5–5.1)
Sodium: 136 mmol/L (ref 135–145)

## 2020-02-02 LAB — CBC
HCT: 30.6 % — ABNORMAL LOW (ref 39.0–52.0)
Hemoglobin: 9.9 g/dL — ABNORMAL LOW (ref 13.0–17.0)
MCH: 30.8 pg (ref 26.0–34.0)
MCHC: 32.4 g/dL (ref 30.0–36.0)
MCV: 95.3 fL (ref 80.0–100.0)
Platelets: 339 10*3/uL (ref 150–400)
RBC: 3.21 MIL/uL — ABNORMAL LOW (ref 4.22–5.81)
RDW: 14.8 % (ref 11.5–15.5)
WBC: 11.8 10*3/uL — ABNORMAL HIGH (ref 4.0–10.5)
nRBC: 0 % (ref 0.0–0.2)

## 2020-02-02 LAB — GLUCOSE, CAPILLARY
Glucose-Capillary: 135 mg/dL — ABNORMAL HIGH (ref 70–99)
Glucose-Capillary: 136 mg/dL — ABNORMAL HIGH (ref 70–99)
Glucose-Capillary: 137 mg/dL — ABNORMAL HIGH (ref 70–99)
Glucose-Capillary: 151 mg/dL — ABNORMAL HIGH (ref 70–99)

## 2020-02-02 LAB — HEPATITIS B SURFACE ANTIBODY, QUANTITATIVE: Hep B S AB Quant (Post): <3.1 m[IU]/mL — ABNORMAL LOW (ref >9.9)

## 2020-02-02 LAB — APTT
aPTT: 90 seconds — ABNORMAL HIGH (ref 24–36)
aPTT: 73 seconds — ABNORMAL HIGH (ref 24–36)

## 2020-02-02 LAB — HEPARIN LEVEL (UNFRACTIONATED): Heparin Unfractionated: >2.2 IU/mL — ABNORMAL HIGH (ref 0.30–0.70)

## 2020-02-02 MED ORDER — FUROSEMIDE 10 MG/ML IJ SOLN
40.0000 mg | Freq: Four times a day (QID) | INTRAMUSCULAR | Status: DC
  Administered 2020-02-02 – 2020-02-05 (×13): 40 mg via INTRAVENOUS
  Filled 2020-02-02 (×13): qty 4

## 2020-02-02 NOTE — Progress Notes (Signed)
Subjective:  No real c/o's- had brief first HD yesterday -  Complicated by a little hypotension-  Also made 2100 of urine ! Lowest BP in the 90's -- he says he feels lighter and appetite is better   Objective Vital signs in last 24 hours: Vitals:   02/02/20 0400 02/02/20 0500 02/02/20 0600 02/02/20 0700  BP: (!) 111/49 (!) 115/101 (!) 92/59 (!) 89/61  Pulse: 79 77 78 76  Resp: 20 13 16  (!) 21  Temp: 98.1 F (36.7 C)   98.7 F (37.1 C)  TempSrc: Oral   Oral  SpO2: 96% 91% 95% 94%  Weight:   89.5 kg   Height:       Weight change: -0.2 kg  Intake/Output Summary (Last 24 hours) at 02/02/2020 3244 Last data filed at 02/02/2020 0700 Gross per 24 hour  Intake 794.77 ml  Output 3050 ml  Net -2255.23 ml    Assessment/Plan: 86 year old white male with hypertension and baseline stage III CKD.  He has now suffered acute kidney injury in the setting of CABG done on 0/03/2724 complicated by hypotension 1.Renal-  patient did have some baseline CKD -GFR of around 40.  He has now suffered AKI in the setting of CABG and postoperative hypotension, c/w  ATN. efforts to attempt to increase blood pressure-he is on midodrine and really no blood pressure lowering agents.  His blood pressure has been better  and UOP is picking up from nothing.    since there was a  failure to thrive per nursing went ahead and did an HD treatment yesterday 8/6  to provide clearance and a little UF.  Tolerated pretty well-  Numbers better and 2 liters plus negative on his I's and O's  2. Hypertension/volume  - soft BP but volume overloaded. midodrine- has been increased to max dose.    Lasix 80 q 6 finally giving [place] some diuresis-  Will dec to 40 q 6 as he may be also having a post injury diuresis.   3. Anemia  -not that significant at this time.  Continue monitoring and supportive therapy 4.  Hyponatremia-  Due to volume overload-  Improved     Bryan Dunn    Labs: Basic Metabolic Panel: Recent Labs  Lab  01/31/20 0509 02/01/20 0609 02/02/20 0443  NA 126* 130* 136  K 4.3 4.5 3.8  CL 95* 98 99  CO2 17* 18* 20*  GLUCOSE 133* 141* 149*  BUN 89* 95* 68*  CREATININE 4.11* 3.85* 2.75*  CALCIUM 8.0* 8.6* 8.5*  PHOS 4.3 4.3 3.4   Liver Function Tests: Recent Labs  Lab 01/31/20 0509 02/01/20 0609 02/02/20 0443  ALBUMIN 3.2* 3.2* 3.0*   No results for input(s): LIPASE, AMYLASE in the last 168 hours. No results for input(s): AMMONIA in the last 168 hours. CBC: Recent Labs  Lab 01/27/20 0521 01/27/20 0521 01/28/20 0300 01/28/20 0300 01/29/20 0537 02/01/20 0609 02/02/20 0443  WBC 8.5   < > 9.6  --   --  12.5* 11.8*  HGB 9.6*   < > 9.7*   < > 9.2* 10.6* 9.9*  HCT 29.2*   < > 30.0*   < > 27.0* 32.0* 30.6*  MCV 95.7  --  96.5  --   --  93.3 95.3  PLT 149*   < > 173  --   --  354 339   < > = values in this interval not displayed.   Cardiac Enzymes: No results for input(s): CKTOTAL, CKMB,  CKMBINDEX, TROPONINI in the last 168 hours. CBG: Recent Labs  Lab 02/01/20 0625 02/01/20 0810 02/01/20 1123 02/01/20 1527 02/02/20 0700  GLUCAP 141* 130* 124* 94 135*    Iron Studies: No results for input(s): IRON, TIBC, TRANSFERRIN, FERRITIN in the last 72 hours. Studies/Results: DG CHEST PORT 1 VIEW  Result Date: 01/31/2020 CLINICAL DATA:  Line placement. EXAM: PORTABLE CHEST 1 VIEW COMPARISON:  January 28, 2020. FINDINGS: Stable cardiomegaly. Status post coronary bypass graft. Right internal jugular catheter is unchanged in position. Interval placement of left internal jugular catheter with distal tip in expected position of the SVC. Left-sided chest tube is noted without pneumothorax. Probable mild bibasilar subsegmental atelectasis is noted. Bony thorax is unremarkable. IMPRESSION: Interval placement of left internal jugular catheter with distal tip in expected position of the SVC. Left-sided chest tube is noted without pneumothorax. Probable mild bibasilar subsegmental atelectasis.  Electronically Signed   By: Marijo Conception M.D.   On: 01/31/2020 11:28   Medications: Infusions: . sodium chloride    . sodium chloride    . heparin 800 Units/hr (02/02/20 0700)  . lactated ringers Stopped (01/28/20 0058)    Scheduled Medications: . amiodarone  400 mg Oral BID  . aspirin EC  81 mg Oral Daily   Or  . aspirin  81 mg Per Tube Daily  . atorvastatin  80 mg Oral Daily  . bisacodyl  10 mg Oral Daily   Or  . bisacodyl  10 mg Rectal Daily  . Chlorhexidine Gluconate Cloth  6 each Topical Daily  . Chlorhexidine Gluconate Cloth  6 each Topical Q0600  . ciprofloxacin  500 mg Oral Q24H  . docusate sodium  200 mg Oral Daily  . ferrous gluconate  324 mg Oral Q breakfast  . furosemide  80 mg Intravenous Q6H  . insulin aspart  0-24 Units Subcutaneous TID WC  . melatonin  3 mg Oral QHS  . metoCLOPramide (REGLAN) injection  5 mg Intravenous Q8H  . midodrine  10 mg Oral TID WC  . multivitamin  1 tablet Oral BID  . pantoprazole  40 mg Oral Daily  . sodium chloride flush  10-40 mL Intracatheter Q12H  . sodium chloride flush  3 mL Intravenous Q12H    have reviewed scheduled and prn medications.  Physical Exam: General: NAD Heart: slightly brady Lungs: mostly clear Abdomen: soft, non tender Extremities: pitting edema    02/02/2020,7:23 AM  LOS: 12 days

## 2020-02-02 NOTE — Progress Notes (Signed)
ANTICOAGULATION CONSULT NOTE - Follow Up Consult  Pharmacy Consult for heparin Indication: LA thrombus and Afib  Labs: Recent Labs    01/30/20 0743 01/31/20 0509 01/31/20 1133 01/31/20 1133 02/01/20 0609 02/01/20 0609 02/01/20 1546 02/01/20 1719 02/02/20 0443  HGB  --   --   --   --  10.6*  --   --   --  9.9*  HCT  --   --   --   --  32.0*  --   --   --  30.6*  PLT  --   --   --   --  354  --   --   --  339  APTT  --   --  43*   < > 89*   < > 105* 84* 90*  HEPARINUNFRC  --   --  >2.20*  --   --   --   --   --   --   CREATININE 3.67* 4.11*  --   --  3.85*  --   --   --   --    < > = values in this interval not displayed.    Assessment: 86yo male supratherapeutic on heparin after one PTT at goal; no gtt issues or signs of bleeding per RN.  Goal of Therapy:  aPTT 66-85 seconds   Plan:  Will decrease heparin gtt by ~1 units/kg/hr to 800 units/hr and check PTT in 8 hours.    Wynona Neat, PharmD, BCPS  02/02/2020,5:32 AM

## 2020-02-02 NOTE — Progress Notes (Signed)
ANTICOAGULATION CONSULT NOTE - Follow-Up Consult  Pharmacy Consult for Eliquis >> Heparin Indication: large LA thrombus and recent atrial fibrillation  No Known Allergies  Patient Measurements: Height: 5' 9.5" (176.5 cm) Weight: 89.5 kg (197 lb 5 oz) IBW/kg (Calculated) : 71.85 Heparin Dosing Weight: 79 kg  Vital Signs: Temp: 98.8 F (37.1 C) (08/07 1100) Temp Source: Oral (08/07 1100) BP: 95/55 (08/07 1300) Pulse Rate: 74 (08/07 1300)  Labs: Recent Labs    01/31/20 0509 01/31/20 1133 01/31/20 1133 02/01/20 0609 02/01/20 1546 02/01/20 1719 02/02/20 0443 02/02/20 1330  HGB  --   --   --  10.6*  --   --  9.9*  --   HCT  --   --   --  32.0*  --   --  30.6*  --   PLT  --   --   --  354  --   --  339  --   APTT  --  43*   < > 89*   < > 84* 90* 73*  HEPARINUNFRC  --  >2.20*  --   --   --   --  >2.20*  --   CREATININE 4.11*  --   --  3.85*  --   --  2.75*  --    < > = values in this interval not displayed.    Estimated Creatinine Clearance: 21.1 mL/min (A) (by C-G formula based on SCr of 2.75 mg/dL (H)).   Medical History: Past Medical History:  Diagnosis Date  . Benign essential HTN   . CAD (coronary artery disease)    remote PCI > 20 years ago in [place]  . HLD (hyperlipidemia)   . Neuropathy     Assessment: 86 yo M presents with large left atrial thrombus and recent atrial fibrillation. Eliquis was started during this admission on 7/29. With worsening renal function and possible dialysis, pharmacy asked to switch to IV heparin. Last dose of Eliquis given 8/5 AM. Baseline HL falsely elevated given recent Eliquis. Baseline aPTT 43.  aPTT  therapeutic at 73sec on heparin drip 800 uts/hr . CBC stable.  Will continue to dose based on aPTT until aPTT and HL correlate. Discuss timing with MD of back to apixaban  Goal of Therapy:  APTT 66-86 sec Heparin level 0.3-0.7 units/ml Monitor platelets by anticoagulation protocol: Yes   Plan:  Continue heparin infusion at 800  units/hr Monitor daily aPTT/HL, CBC, s/sx bleeding  Bonnita Nasuti Pharm.D. CPP, BCPS Clinical Pharmacist [phone] 02/02/2020 3:20 PM    Please see AMION for all Pharmacists' Contact Phone Numbers 02/02/2020, 3:19 PM

## 2020-02-02 NOTE — Progress Notes (Signed)
      [address]       [place] [address]             [phone]      Up in chair No new issues today BP 106/70 (BP Location: Right Arm)   Pulse 73   Temp 98.2 F (36.8 C) (Oral)   Resp (!) 21   Ht 5' 9.5" (1.765 m)   Wt 89.5 kg   SpO2 98%   BMI 28.72 kg/m   Intake/Output Summary (Last 24 hours) at 02/02/2020 1739 Last data filed at 02/02/2020 1600 Gross per 24 hour  Intake 196.57 ml  Output 1600 ml  Net -1403.43 ml   Continue current Rx  Kaelei Wheeler C. Roxan Hockey, MD [facility] [phone]

## 2020-02-02 NOTE — TOC Progression Note (Addendum)
Transition of Care [facility]) - Progression Note    Patient Details  Name: Bryan Dunn MRN: [id number] Date of Birth: 25-Sep-1930  Transition of [facility]) CM/SW [place], [place] Phone Number: 02/02/2020, 12:07 PM  Clinical Narrative:     3:10- CSW received phone call from pts son Bryan Dunn. Bryan Dunn stated that he called a couple facilities to inquire about visitation and access to private rooms. Bryan Dunn stated that he was not happy with the answers he got.   Bryan Dunn stated he talked to his family and they are thinking about taking him to their home. Bryan Dunn stated they would be able to provided the 24 hour supervision. Bryan Dunn stated he is open to pt/ot coming into the home. Bryan Dunn has not yet made a 100% answer and will continue to talk it over with pt and the family to pick the best option.   CSW gave pt bed offers. Pt requested that CSW call pts sons Bryan Dunn and Bryan Dunn. CSW spoke with Bryan Dunn on the pone and requested that [employer] text choices. Bryan Dunn stated that he will review the choices and talk to his dad and get back with CSW about choice.   Expected Discharge Plan: [place] Barriers to Discharge: Continued Medical Work up  Expected Discharge Plan and Services Expected Discharge Plan: [place] arrangements for the past 2 months: Single Family Home                                       Social Determinants of Health (SDOH) Interventions    Readmission Risk Interventions No flowsheet data found.  Emeterio Reeve, Latanya Presser, [place] Social Worker [phone]

## 2020-02-02 NOTE — Progress Notes (Signed)
10 Days Post-Op Procedure(s) (LRB): CORONARY ARTERY BYPASS GRAFTING (CABG), ON PUMP, TIMES FOUR, USING LEFT INTERNAL MAMMARY ARTERY AND ENDOSCOPICALLY HARVESTED LEFT GREATER SAPHENOUS VEIN (N/A) TRANSESOPHAGEAL ECHOCARDIOGRAM (TEE) (N/A) Subjective: No complaints this AM  Objective: Vital signs in last 24 hours: Temp:  [97.9 F (36.6 C)-99.4 F (37.4 C)] 98.7 F (37.1 C) (08/07 0700) Pulse Rate:  [70-81] 76 (08/07 0700) Cardiac Rhythm: Normal sinus rhythm (08/06 2000) Resp:  [13-25] 21 (08/07 0700) BP: (85-115)/(41-101) 89/61 (08/07 0700) SpO2:  [91 %-100 %] 94 % (08/07 0700) Weight:  [89.5 kg] 89.5 kg (08/07 0600)  Hemodynamic parameters for last 24 hours:    Intake/Output from previous day: 08/06 0701 - 08/07 0700 In: 794.8 [P.O.:480; I.V.:214.8; IV Piggyback:100] Out: 3050 [Urine:2100; Chest Tube:450] Intake/Output this shift: No intake/output data recorded.  General appearance: alert, cooperative and no distress Neurologic: intact Heart: regular rate and rhythm Lungs: diminished breath sounds bibasilar Abdomen: normal findings: soft, non-tender Extremities: edema 2-3+  Lab Results: Recent Labs    02/01/20 0609 02/02/20 0443  WBC 12.5* 11.8*  HGB 10.6* 9.9*  HCT 32.0* 30.6*  PLT 354 339   BMET:  Recent Labs    02/01/20 0609 02/02/20 0443  NA 130* 136  K 4.5 3.8  CL 98 99  CO2 18* 20*  GLUCOSE 141* 149*  BUN 95* 68*  CREATININE 3.85* 2.75*  CALCIUM 8.6* 8.5*    PT/INR: No results for input(s): LABPROT, INR in the last 72 hours. ABG    Component Value Date/Time   PHART 7.396 01/23/2020 2244   HCO3 26.2 01/23/2020 2244   TCO2 20 (L) 01/29/2020 0537   ACIDBASEDEF 4.0 (H) 01/23/2020 1333   O2SAT 98.0 01/23/2020 2244   CBG (last 3)  Recent Labs    02/01/20 1123 02/01/20 1527 02/02/20 0700  GLUCAP 124* 94 135*    Assessment/Plan: S/P Procedure(s) (LRB): CORONARY ARTERY BYPASS GRAFTING (CABG), ON PUMP, TIMES FOUR, USING LEFT INTERNAL MAMMARY  ARTERY AND ENDOSCOPICALLY HARVESTED LEFT GREATER SAPHENOUS VEIN (N/A) TRANSESOPHAGEAL ECHOCARDIOGRAM (TEE) (N/A) -CV- Bp still relatively low in setting of negative I/O balance RESP- continue IS RENAL- creatinine better post HD  UO has picked up significantly - hopefully resolving AKI ENDO- CBG well controlled Deconditioning- mobilize as tolerated Anemia- mild, follow   LOS: 12 days    [place] Nakayama 02/02/2020

## 2020-02-03 ENCOUNTER — Inpatient Hospital Stay (HOSPITAL_COMMUNITY): Payer: Medicare Other

## 2020-02-03 DIAGNOSIS — L8990 Pressure ulcer of unspecified site, unspecified stage: Secondary | ICD-10-CM | POA: Insufficient documentation

## 2020-02-03 LAB — RENAL FUNCTION PANEL
Albumin: 3 g/dL — ABNORMAL LOW (ref 3.5–5.0)
Anion gap: 14 (ref 5–15)
BUN: 65 mg/dL — ABNORMAL HIGH (ref 8–23)
CO2: 25 mmol/L (ref 22–32)
Calcium: 8.6 mg/dL — ABNORMAL LOW (ref 8.9–10.3)
Chloride: 97 mmol/L — ABNORMAL LOW (ref 98–111)
Creatinine, Ser: 2.57 mg/dL — ABNORMAL HIGH (ref 0.61–1.24)
GFR calc Af Amer: 25 mL/min — ABNORMAL LOW (ref >60)
GFR calc non Af Amer: 22 mL/min — ABNORMAL LOW (ref >60)
Glucose, Bld: 139 mg/dL — ABNORMAL HIGH (ref 70–99)
Phosphorus: 3.3 mg/dL (ref 2.5–4.6)
Potassium: 3.5 mmol/L (ref 3.5–5.1)
Sodium: 136 mmol/L (ref 135–145)

## 2020-02-03 LAB — CBC
HCT: 30.2 % — ABNORMAL LOW (ref 39.0–52.0)
Hemoglobin: 10.3 g/dL — ABNORMAL LOW (ref 13.0–17.0)
MCH: 31.6 pg (ref 26.0–34.0)
MCHC: 34.1 g/dL (ref 30.0–36.0)
MCV: 92.6 fL (ref 80.0–100.0)
Platelets: 423 10*3/uL — ABNORMAL HIGH (ref 150–400)
RBC: 3.26 MIL/uL — ABNORMAL LOW (ref 4.22–5.81)
RDW: 14.6 % (ref 11.5–15.5)
WBC: 12.7 10*3/uL — ABNORMAL HIGH (ref 4.0–10.5)
nRBC: 0 % (ref 0.0–0.2)

## 2020-02-03 LAB — GLUCOSE, CAPILLARY
Glucose-Capillary: 140 mg/dL — ABNORMAL HIGH (ref 70–99)
Glucose-Capillary: 163 mg/dL — ABNORMAL HIGH (ref 70–99)
Glucose-Capillary: 112 mg/dL — ABNORMAL HIGH (ref 70–99)

## 2020-02-03 LAB — HEPARIN LEVEL (UNFRACTIONATED): Heparin Unfractionated: 1.3 IU/mL — ABNORMAL HIGH (ref 0.30–0.70)

## 2020-02-03 LAB — APTT: aPTT: 63 seconds — ABNORMAL HIGH (ref 24–36)

## 2020-02-03 MED ORDER — SODIUM CHLORIDE 0.9% FLUSH: 3.0000 mL | INTRAVENOUS | Status: DC | PRN

## 2020-02-03 MED ORDER — SODIUM CHLORIDE 0.9 % IV SOLN: 250.0000 mL | INTRAVENOUS | Status: DC | PRN

## 2020-02-03 MED ORDER — POTASSIUM CHLORIDE CRYS ER 20 MEQ PO TBCR
40.0000 meq | EXTENDED_RELEASE_TABLET | Freq: Once | ORAL | Status: AC
  Administered 2020-02-03: 40 meq via ORAL
  Filled 2020-02-03: qty 2

## 2020-02-03 MED ORDER — SODIUM CHLORIDE 0.9% FLUSH
3.0000 mL | Freq: Two times a day (BID) | INTRAVENOUS | Status: DC
  Administered 2020-02-03 – 2020-02-06 (×6): 3 mL via INTRAVENOUS

## 2020-02-03 MED ORDER — ~~LOC~~ CARDIAC SURGERY, PATIENT & FAMILY EDUCATION
Freq: Once | Status: AC
  Administered 2020-02-03: 1

## 2020-02-03 MED ORDER — MIDODRINE HCL 5 MG PO TABS
15.0000 mg | ORAL_TABLET | Freq: Three times a day (TID) | ORAL | Status: DC
  Administered 2020-02-03 – 2020-02-06 (×8): 15 mg via ORAL
  Filled 2020-02-03 (×8): qty 3

## 2020-02-03 MED ORDER — WHITE PETROLATUM EX OINT
TOPICAL_OINTMENT | CUTANEOUS | Status: DC | PRN
  Filled 2020-02-03: qty 28.35

## 2020-02-03 NOTE — Progress Notes (Signed)
ANTICOAGULATION CONSULT NOTE - Follow-Up Consult  Pharmacy Consult for Eliquis >> Heparin Indication: large LA thrombus and recent atrial fibrillation  No Known Allergies  Patient Measurements: Height: 5' 9.5" (176.5 cm) Weight: 84.9 kg (187 lb 2.7 oz) IBW/kg (Calculated) : 71.85 Heparin Dosing Weight: 79 kg  Vital Signs: Temp: 98.7 F (37.1 C) (08/08 1154) Temp Source: Oral (08/08 1154) BP: 103/58 (08/08 1200) Pulse Rate: 72 (08/08 1200)  Labs: Recent Labs    02/01/20 0609 02/01/20 1546 02/02/20 0443 02/02/20 1330 02/03/20 0322  HGB 10.6*  --  9.9*  --  10.3*  HCT 32.0*  --  30.6*  --  30.2*  PLT 354  --  339  --  423*  APTT 89*   < > 90* 73* 63*  HEPARINUNFRC  --   --  >2.20*  --  1.30*  CREATININE 3.85*  --  2.75*  --  2.57*   < > = values in this interval not displayed.    Estimated Creatinine Clearance: 20.6 mL/min (A) (by C-G formula based on SCr of 2.57 mg/dL (H)).   Medical History: Past Medical History:  Diagnosis Date  . Benign essential HTN   . CAD (coronary artery disease)    remote PCI > 20 years ago in [place]  . HLD (hyperlipidemia)   . Neuropathy     Assessment: 86 yo M presents with large left atrial thrombus and recent atrial fibrillation. Eliquis was started during this admission on 7/29. With worsening renal function and possible dialysis, pharmacy asked to switch to IV heparin. Last dose of Eliquis given 8/5 AM. Baseline HL falsely elevated given recent Eliquis. Baseline aPTT 43.  HL continue to be falsely elevated from apixaban continue aptt to dose heparin APTT bottom end of  therapeutic at 63sec on heparin drip 800 uts/hr - but all other levels on this rate have been within range  - no changes for today CBC stable.   Discuss timing with MD of back to apixaban CT removed today  Maintaining SR   Goal of Therapy:  APTT 66-86 sec Heparin level 0.3-0.7 units/ml Monitor platelets by anticoagulation protocol: Yes   Plan:  Continue heparin  infusion at 800 units/hr Monitor daily aPTT/HL, CBC, s/sx bleeding Consider switching back to apixaban tomorrow  Bonnita Nasuti Pharm.D. CPP, BCPS Clinical Pharmacist [phone] 02/03/2020 2:08 PM    Please see AMION for all Pharmacists' Contact Phone Numbers 02/03/2020, 2:08 PM

## 2020-02-03 NOTE — Progress Notes (Signed)
11 Days Post-Op Procedure(s) (LRB): CORONARY ARTERY BYPASS GRAFTING (CABG), ON PUMP, TIMES FOUR, USING LEFT INTERNAL MAMMARY ARTERY AND ENDOSCOPICALLY HARVESTED LEFT GREATER SAPHENOUS VEIN (N/A) TRANSESOPHAGEAL ECHOCARDIOGRAM (TEE) (N/A) Subjective: No complaints this AM  Objective: Vital signs in last 24 hours: Temp:  [98 F (36.7 C)-99.6 F (37.6 C)] 98.5 F (36.9 C) (08/08 0749) Pulse Rate:  [69-85] 73 (08/08 0900) Cardiac Rhythm: Normal sinus rhythm (08/07 2000) Resp:  [13-26] 21 (08/08 0900) BP: (86-117)/(49-79) 96/54 (08/08 0900) SpO2:  [87 %-100 %] 100 % (08/08 0900) Weight:  [84.9 kg] 84.9 kg (08/08 0600)  Hemodynamic parameters for last 24 hours:    Intake/Output from previous day: 08/07 0701 - 08/08 0700 In: 1091.8 [P.O.:900; I.V.:191.8] Out: 3060 [Urine:2800; Chest Tube:260] Intake/Output this shift: Total I/O In: 136 [P.O.:120; I.V.:16] Out: 400 [Urine:400]  General appearance: alert, cooperative and no distress Neurologic: intact Heart: regular rate and rhythm Lungs: diminished breath sounds bibasilar Abdomen: normal findings: soft, non-tender Wound: clean and intact  Lab Results: Recent Labs    02/02/20 0443 02/03/20 0322  WBC 11.8* 12.7*  HGB 9.9* 10.3*  HCT 30.6* 30.2*  PLT 339 423*   BMET:  Recent Labs    02/02/20 0443 02/03/20 0322  NA 136 136  K 3.8 3.5  CL 99 97*  CO2 20* 25  GLUCOSE 149* 139*  BUN 68* 65*  CREATININE 2.75* 2.57*  CALCIUM 8.5* 8.6*    PT/INR: No results for input(s): LABPROT, INR in the last 72 hours. ABG    Component Value Date/Time   PHART 7.396 01/23/2020 2244   HCO3 26.2 01/23/2020 2244   TCO2 20 (L) 01/29/2020 0537   ACIDBASEDEF 4.0 (H) 01/23/2020 1333   O2SAT 98.0 01/23/2020 2244   CBG (last 3)  Recent Labs    02/02/20 1515 02/02/20 2115 02/03/20 0654  GLUCAP 137* 151* 140*    Assessment/Plan: S/P Procedure(s) (LRB): CORONARY ARTERY BYPASS GRAFTING (CABG), ON PUMP, TIMES FOUR, USING LEFT  INTERNAL MAMMARY ARTERY AND ENDOSCOPICALLY HARVESTED LEFT GREATER SAPHENOUS VEIN (N/A) TRANSESOPHAGEAL ECHOCARDIOGRAM (TEE) (N/A) Plan for transfer to step-down: see transfer orders  CV- A fib, rate controlled  On IV heparin until Eliquis can be restarted  BP still relatively low- increase midodrine to 15mg  TID RESP- good sats on RA  Drainage down- dc chest tube RENAL- creatinine down and UO has been good over past 2 days  Will leave dialysis cath for now, but likely won't need again  Nephrology managing ENDO- CBG modestly elevated- continue [facility] Cardiac rehab   LOS: 13 days    [place] Nakayama 02/03/2020

## 2020-02-03 NOTE — Progress Notes (Signed)
Subjective:  BP's have been for the most part over 100 since yesterday afternoon-  Nearly 3 liters of urine- crt trended down -  Slept well-  Looks good   Objective Vital signs in last 24 hours: Vitals:   02/03/20 0400 02/03/20 0500 02/03/20 0600 02/03/20 0700  BP: 117/79 102/72 107/63 96/66  Pulse: 74 72 75 85  Resp: (!) 26 15 19 14   Temp:      TempSrc:      SpO2: 97% 92% (!) 88% 93%  Weight:   84.9 kg   Height:       Weight change: -4.6 kg  Intake/Output Summary (Last 24 hours) at 02/03/2020 5427 Last data filed at 02/03/2020 0700 Gross per 24 hour  Intake 1091.84 ml  Output 3060 ml  Net -1968.16 ml    Assessment/Plan: 86 year old white male with hypertension and baseline stage III CKD.  He has now suffered acute kidney injury in the setting of CABG done on 0/62/3762 complicated by hypotension 1.Renal-  patient did have some baseline CKD -GFR of around 40.  He has now suffered AKI in the setting of CABG and postoperative hypotension, c/w  ATN. efforts to increase blood pressure-he is on midodrine and really no blood pressure lowering agents.  His blood pressure has been better  and UOP is picking up from nothing.    since there was a  failure to thrive per went ahead and did an HD treatment 8/6  to provide clearance and a little UF.  Tolerated pretty well-  Numbers better and 2 liters plus negative on his I's and O's- now clearing on his own  2. Hypertension/volume  - soft BP but volume overloaded. midodrine- has been increased to max dose.    Lasix 80 q 6 finally giving [place] some diuresis-  dec to 40 q 6 - still diuresing   3. Anemia  -not that significant at this time.  Continue monitoring and supportive therapy 4.  Hyponatremia-  Due to volume overload-  Improved  5. Hypokalemia-  Give supp    Bryan Dunn    Labs: Basic Metabolic Panel: Recent Labs  Lab 02/01/20 0609 02/02/20 0443 02/03/20 0322  NA 130* 136 136  K 4.5 3.8 3.5  CL 98 99 97*  CO2 18* 20* 25   GLUCOSE 141* 149* 139*  BUN 95* 68* 65*  CREATININE 3.85* 2.75* 2.57*  CALCIUM 8.6* 8.5* 8.6*  PHOS 4.3 3.4 3.3   Liver Function Tests: Recent Labs  Lab 02/01/20 0609 02/02/20 0443 02/03/20 0322  ALBUMIN 3.2* 3.0* 3.0*   No results for input(s): LIPASE, AMYLASE in the last 168 hours. No results for input(s): AMMONIA in the last 168 hours. CBC: Recent Labs  Lab 01/28/20 0300 01/29/20 0537 02/01/20 0609 02/02/20 0443 02/03/20 0322  WBC 9.6  --  12.5* 11.8* 12.7*  HGB 9.7*   < > 10.6* 9.9* 10.3*  HCT 30.0*   < > 32.0* 30.6* 30.2*  MCV 96.5  --  93.3 95.3 92.6  PLT 173  --  354 339 423*   < > = values in this interval not displayed.   Cardiac Enzymes: No results for input(s): CKTOTAL, CKMB, CKMBINDEX, TROPONINI in the last 168 hours. CBG: Recent Labs  Lab 02/02/20 0700 02/02/20 1059 02/02/20 1515 02/02/20 2115 02/03/20 0654  GLUCAP 135* 136* 137* 151* 140*    Iron Studies: No results for input(s): IRON, TIBC, TRANSFERRIN, FERRITIN in the last 72 hours. Studies/Results: No results found. Medications: Infusions: .  sodium chloride    . sodium chloride    . heparin 800 Units/hr (02/03/20 0700)  . lactated ringers Stopped (01/28/20 0058)    Scheduled Medications: . amiodarone  400 mg Oral BID  . aspirin EC  81 mg Oral Daily   Or  . aspirin  81 mg Per Tube Daily  . atorvastatin  80 mg Oral Daily  . bisacodyl  10 mg Oral Daily   Or  . bisacodyl  10 mg Rectal Daily  . Chlorhexidine Gluconate Cloth  6 each Topical Daily  . Chlorhexidine Gluconate Cloth  6 each Topical Q0600  . ciprofloxacin  500 mg Oral Q24H  . docusate sodium  200 mg Oral Daily  . ferrous gluconate  324 mg Oral Q breakfast  . furosemide  40 mg Intravenous Q6H  . insulin aspart  0-24 Units Subcutaneous TID WC  . melatonin  3 mg Oral QHS  . metoCLOPramide (REGLAN) injection  5 mg Intravenous Q8H  . midodrine  10 mg Oral TID WC  . multivitamin  1 tablet Oral BID  . pantoprazole  40 mg  Oral Daily  . sodium chloride flush  10-40 mL Intracatheter Q12H  . sodium chloride flush  3 mL Intravenous Q12H    have reviewed scheduled and prn medications.  Physical Exam: General: NAD-  Looks better Heart: slightly brady Lungs: mostly clear Abdomen: soft, non tender Extremities: pitting edema but better     02/03/2020,7:28 AM  LOS: 13 days

## 2020-02-03 NOTE — Plan of Care (Signed)

## 2020-02-04 LAB — CBC
HCT: 29.1 % — ABNORMAL LOW (ref 39.0–52.0)
Hemoglobin: 9.6 g/dL — ABNORMAL LOW (ref 13.0–17.0)
MCH: 30.8 pg (ref 26.0–34.0)
MCHC: 33 g/dL (ref 30.0–36.0)
MCV: 93.3 fL (ref 80.0–100.0)
Platelets: 391 10*3/uL (ref 150–400)
RBC: 3.12 MIL/uL — ABNORMAL LOW (ref 4.22–5.81)
RDW: 14.7 % (ref 11.5–15.5)
WBC: 13 10*3/uL — ABNORMAL HIGH (ref 4.0–10.5)
nRBC: 0 % (ref 0.0–0.2)

## 2020-02-04 LAB — RENAL FUNCTION PANEL
Albumin: 2.9 g/dL — ABNORMAL LOW (ref 3.5–5.0)
Anion gap: 13 (ref 5–15)
BUN: 61 mg/dL — ABNORMAL HIGH (ref 8–23)
CO2: 26 mmol/L (ref 22–32)
Calcium: 8.7 mg/dL — ABNORMAL LOW (ref 8.9–10.3)
Chloride: 97 mmol/L — ABNORMAL LOW (ref 98–111)
Creatinine, Ser: 2.44 mg/dL — ABNORMAL HIGH (ref 0.61–1.24)
GFR calc Af Amer: 27 mL/min — ABNORMAL LOW (ref >60)
GFR calc non Af Amer: 23 mL/min — ABNORMAL LOW (ref >60)
Glucose, Bld: 137 mg/dL — ABNORMAL HIGH (ref 70–99)
Phosphorus: 3.4 mg/dL (ref 2.5–4.6)
Potassium: 3.3 mmol/L — ABNORMAL LOW (ref 3.5–5.1)
Sodium: 136 mmol/L (ref 135–145)

## 2020-02-04 LAB — HEPARIN LEVEL (UNFRACTIONATED): Heparin Unfractionated: 0.78 IU/mL — ABNORMAL HIGH (ref 0.30–0.70)

## 2020-02-04 LAB — GLUCOSE, CAPILLARY: Glucose-Capillary: 145 mg/dL — ABNORMAL HIGH (ref 70–99)

## 2020-02-04 LAB — APTT: aPTT: 64 seconds — ABNORMAL HIGH (ref 24–36)

## 2020-02-04 MED ORDER — POTASSIUM CHLORIDE CRYS ER 20 MEQ PO TBCR
40.0000 meq | EXTENDED_RELEASE_TABLET | Freq: Once | ORAL | Status: AC
  Administered 2020-02-04: 40 meq via ORAL
  Filled 2020-02-04: qty 2

## 2020-02-04 MED ORDER — POTASSIUM CHLORIDE CRYS ER 20 MEQ PO TBCR
40.0000 meq | EXTENDED_RELEASE_TABLET | Freq: Every day | ORAL | Status: DC
  Administered 2020-02-04 – 2020-02-06 (×3): 40 meq via ORAL
  Filled 2020-02-04 (×3): qty 2

## 2020-02-04 NOTE — TOC Progression Note (Signed)
Transition of Care [facility]) - Progression Note    Patient Details  Name: Bryan Dunn MRN: [id number] Date of Birth: 1931/01/28  Transition of Care [facility]) CM/SW [place], [place] Phone Number: 02/04/2020, 4:33 PM  Clinical Narrative:     Pts family has not made a decision about pt going home with his son or to SNF.   Expected Discharge Plan: [place] Barriers to Discharge: Continued Medical Work up  Expected Discharge Plan and Services Expected Discharge Plan: [place] arrangements for the past 2 months: Single Family Home                                       Social Determinants of Health (SDOH) Interventions    Readmission Risk Interventions No flowsheet data found.  Emeterio Reeve, Latanya Presser, [place] Social Worker [phone]

## 2020-02-04 NOTE — Progress Notes (Signed)
[facility] Progress Note   86 year old white male with hypertension and baseline stage III CKD. He has now suffered acute kidney injury in the setting of CABG done on 6/72/0947 complicated by hypotension   Assessment/ Plan:   1.Renal-  patient did have some baseline CKD -GFR of around 40. He has now suffered AKI in the setting of CABG and postoperative hypotension, c/w ATN.efforts to increase blood pressure-he is on midodrine and really no blood pressure lowering agents. His blood pressure has been better  and UOP is picking up.  Started RRT  8/6 (fri)  to provide clearance and a little UF.  Tolerated pretty well-  Numbers better and 2 liters plus negative on his I's and O's- now clearing on his own  Will sign off at this time; please reconsult as needed.  2. Hypertension/volume - soft BP but volume overloaded. midodrine- has been increased to max dose.   Lasix  Decreased to 40 q 6 and UOP still great.  3. Anemia-not that significant at this time. Continue monitoring and supportive therapy 4.  Hyponatremia-  Due to volume overload-  Improved  5. Hypokalemia-  Give supp  Subjective:   Feeling better but still weak Denies f/c/n/v   Objective:   BP 102/66 (BP Location: Right Arm)   Pulse 73   Temp 98.1 F (36.7 C) (Oral)   Resp 20   Ht 5' 9.5" (1.765 m)   Wt 84.9 kg   SpO2 100%   BMI 27.24 kg/m   Intake/Output Summary (Last 24 hours) at 02/04/2020 1334 Last data filed at 02/04/2020 0802 Gross per 24 hour  Intake 383.96 ml  Output 3050 ml  Net -2666.04 ml   Weight change: 0 kg  Physical Exam: General: NAD-  Looks better Heart: RRR Lungs: mostly clear Abdomen: soft, non tender Extremities: tr edema   Imaging: DG Chest Port 1 View  Result Date: 02/03/2020 CLINICAL DATA:  Chest tube removal EXAM: PORTABLE CHEST 1 VIEW COMPARISON:  01/31/2020 chest radiograph. FINDINGS: Removal of left chest tube and mediastinal drains. Removal of right internal  jugular central venous sheath. Left internal jugular central venous catheter terminates in the upper third of the SVC. Intact sternotomy wires. Stable cardiomediastinal silhouette with mild cardiomegaly. No pneumothorax. Small left pleural effusion. No right pleural effusion. Cephalization of the pulmonary vasculature without overt pulmonary edema. Mild bibasilar atelectasis, improved. IMPRESSION: 1. No pneumothorax. 2. Small left pleural effusion. 3. Mild bibasilar atelectasis, improved. 4. Stable mild cardiomegaly without overt pulmonary edema. Electronically Signed   By: Ilona Sorrel M.D.   On: 02/03/2020 14:05    Labs: BMET Recent Labs  Lab 01/29/20 0537 01/30/20 0743 01/31/20 0509 02/01/20 0609 02/02/20 0443 02/03/20 0322 02/04/20 0405  NA 129*  126* 126* 126* 130* 136 136 136  K 3.5  3.5 4.6 4.3 4.5 3.8 3.5 3.3*  CL 96*  97* 95* 95* 98 99 97* 97*  CO2 18* 17* 17* 18* 20* 25 26  GLUCOSE 138*  141* 121* 133* 141* 149* 139* 137*  BUN 61*  65* 74* 89* 95* 68* 65* 61*  CREATININE 3.00*  2.76* 3.67* 4.11* 3.85* 2.75* 2.57* 2.44*  CALCIUM 7.9* 8.1* 8.0* 8.6* 8.5* 8.6* 8.7*  PHOS  --   --  4.3 4.3 3.4 3.3 3.4   CBC Recent Labs  Lab 02/01/20 0609 02/02/20 0443 02/03/20 0322 02/04/20 0405  WBC 12.5* 11.8* 12.7* 13.0*  HGB 10.6* 9.9* 10.3* 9.6*  HCT 32.0* 30.6* 30.2* 29.1*  MCV 93.3  95.3 92.6 93.3  PLT 354 339 423* 391    Medications:    . amiodarone  400 mg Oral BID  . aspirin EC  81 mg Oral Daily   Or  . aspirin  81 mg Per Tube Daily  . atorvastatin  80 mg Oral Daily  . bisacodyl  10 mg Oral Daily   Or  . bisacodyl  10 mg Rectal Daily  . Chlorhexidine Gluconate Cloth  6 each Topical Daily  . ciprofloxacin  500 mg Oral Q24H  . docusate sodium  200 mg Oral Daily  . ferrous gluconate  324 mg Oral Q breakfast  . furosemide  40 mg Intravenous Q6H  . melatonin  3 mg Oral QHS  . metoCLOPramide (REGLAN) injection  5 mg Intravenous Q8H  . midodrine  15 mg Oral TID WC   . multivitamin  1 tablet Oral BID  . pantoprazole  40 mg Oral Daily  . potassium chloride  40 mEq Oral Daily  . sodium chloride flush  10-40 mL Intracatheter Q12H  . sodium chloride flush  3 mL Intravenous Q12H      Otelia Santee, MD 02/04/2020, 1:34 PM

## 2020-02-04 NOTE — Progress Notes (Addendum)
      [address]       [place] [address]             [phone]      12 Days Post-Op Procedure(s) (LRB): CORONARY ARTERY BYPASS GRAFTING (CABG), ON PUMP, TIMES FOUR, USING LEFT INTERNAL MAMMARY ARTERY AND ENDOSCOPICALLY HARVESTED LEFT GREATER SAPHENOUS VEIN (N/A) TRANSESOPHAGEAL ECHOCARDIOGRAM (TEE) (N/A)   Subjective:  Just had a BM.  No specific complaints.  Objective: Vital signs in last 24 hours: Temp:  [98.1 F (36.7 C)-98.7 F (37.1 C)] 98.1 F (36.7 C) (08/09 0232) Pulse Rate:  [64-77] 71 (08/09 0232) Cardiac Rhythm: Normal sinus rhythm (08/09 0700) Resp:  [14-21] 18 (08/09 0232) BP: (95-116)/(48-61) 98/51 (08/09 0232) SpO2:  [90 %-100 %] 94 % (08/09 0232) Weight:  [84.9 kg] 84.9 kg (08/09 0440)  Intake/Output from previous day: 08/08 0701 - 08/09 0700 In: 1002 [P.O.:810; I.V.:192] Out: 2800 [Urine:2800]  General appearance: alert, cooperative and no distress Heart: regular rate and rhythm Lungs: diminished breath sounds bibasilar Abdomen: soft, non-tender; bowel sounds normal; no masses,  no organomegaly Extremities: edema trace Wound: clean and dry  Lab Results: Recent Labs    02/03/20 0322 02/04/20 0405  WBC 12.7* 13.0*  HGB 10.3* 9.6*  HCT 30.2* 29.1*  PLT 423* 391   BMET:  Recent Labs    02/03/20 0322 02/04/20 0405  NA 136 136  K 3.5 3.3*  CL 97* 97*  CO2 25 26  GLUCOSE 139* 137*  BUN 65* 61*  CREATININE 2.57* 2.44*  CALCIUM 8.6* 8.7*    PT/INR: No results for input(s): LABPROT, INR in the last 72 hours. ABG    Component Value Date/Time   PHART 7.396 01/23/2020 2244   HCO3 26.2 01/23/2020 2244   TCO2 20 (L) 01/29/2020 0537   ACIDBASEDEF 4.0 (H) 01/23/2020 1333   O2SAT 98.0 01/23/2020 2244   CBG (last 3)  Recent Labs    02/03/20 1153 02/03/20 1650 02/04/20 0615  GLUCAP 163* 112* 145*    Assessment/Plan: S/P Procedure(s) (LRB): CORONARY ARTERY BYPASS GRAFTING (CABG), ON PUMP, TIMES FOUR, USING LEFT  INTERNAL MAMMARY ARTERY AND ENDOSCOPICALLY HARVESTED LEFT GREATER SAPHENOUS VEIN (N/A) TRANSESOPHAGEAL ECHOCARDIOGRAM (TEE) (N/A)  1. CV- rate controlled A. Fib, large LA thrombus on Echocardiogram, BP remains on the low side- continue Midodrine, Amiodarone... on Heparin can hopefully transition to Eliquis soon 2. Pulm- no acute issues, CXR reviewed from yesterday small left pleural effusion, atelectasis, not requiring oxygen, continue pulm toilet 3. Renal- creatinine at 2.44, 2.8L U/O yesterday, K is low at 3.3, will supplement.... nephrology following 4. CBGs- mostly controlled, patient is not a diabetic, will d/c SSIP  5. Dispo- patient stable, in rate controlled A. Fib.... will transition back to Eliquis once Nephrology thinks this is okay, creatinine improving, good U/O, will supplement K,    LOS: 14 days    Ellwood Handler, PA-C 02/04/2020  Agree with above Remains on midodrine.  Bp too low for BB.  On Aspirin, and statin On heparin, will transition to back to eliquis per renal Good uop, creat trending down  Jamaury Gumz O Derald Lorge

## 2020-02-04 NOTE — Plan of Care (Signed)
  Problem: Education: Goal: Will demonstrate proper wound care and an understanding of methods to prevent future damage Outcome: Progressing Goal: Knowledge of disease or condition will improve Outcome: Progressing   Problem: Activity: Goal: Risk for activity intolerance will decrease Outcome: Progressing   Problem: Respiratory: Goal: Respiratory status will improve Outcome: Progressing   Problem: Skin Integrity: Goal: Wound healing without signs and symptoms of infection Outcome: Progressing   Problem: Activity: Goal: Risk for activity intolerance will decrease Outcome: Progressing   Problem: Cardiac: Goal: Will achieve and/or maintain hemodynamic stability Outcome: Progressing

## 2020-02-04 NOTE — Progress Notes (Signed)
ANTICOAGULATION CONSULT NOTE - Follow-Up Consult  Pharmacy Consult for Eliquis >> Heparin Indication: large LA thrombus and recent atrial fibrillation  No Known Allergies  Patient Measurements: Height: 5' 9.5" (176.5 cm) Weight: 84.9 kg (187 lb 2.7 oz) IBW/kg (Calculated) : 71.85 Heparin Dosing Weight: 79 kg  Vital Signs: Temp: 98.1 F (36.7 C) (08/09 0232) Temp Source: Oral (08/09 0232) BP: 102/66 (08/09 0802) Pulse Rate: 73 (08/09 0802)  Labs: Recent Labs    02/02/20 0443 02/02/20 0443 02/02/20 1330 02/03/20 0322 02/04/20 0405  HGB 9.9*   < >  --  10.3* 9.6*  HCT 30.6*  --   --  30.2* 29.1*  PLT 339  --   --  423* 391  APTT 90*   < > 73* 63* 64*  HEPARINUNFRC >2.20*  --   --  1.30* 0.78*  CREATININE 2.75*  --   --  2.57* 2.44*   < > = values in this interval not displayed.    Estimated Creatinine Clearance: 21.7 mL/min (A) (by C-G formula based on SCr of 2.44 mg/dL (H)).   Medical History: Past Medical History:  Diagnosis Date  . Benign essential HTN   . CAD (coronary artery disease)    remote PCI > 20 years ago in [place]  . HLD (hyperlipidemia)   . Neuropathy     Assessment: 86 yo M presents with large left atrial thrombus and recent atrial fibrillation. Eliquis was started during this admission on 7/29. With worsening renal function and possible dialysis, pharmacy asked to switch to IV heparin. Last dose of Eliquis given 8/5 AM. Baseline HL falsely elevated given recent Eliquis. Baseline aPTT 43.  HL continue to be falsely elevated from apixaban continue aptt to dose heparin APTT bottom end of  therapeutic at 64 sec on heparin drip 800 uts/hr - but all other levels on this rate have been within range  - no changes for today CBC stable.   Discuss timing with surgeon and renal of back to apixaban CT removed 8/8 Maintaining SR   Goal of Therapy:  APTT 66-86 sec Heparin level 0.3-0.7 units/ml Monitor platelets by anticoagulation protocol: Yes   Plan:   Continue heparin infusion at 800 units/hr Monitor daily aPTT/HL, CBC, s/sx bleeding Will discuss with renal and TCTS/renal  timing of oral AC  Bonnita Nasuti Pharm.D. CPP, BCPS Clinical Pharmacist [phone] 02/04/2020 12:19 PM    Please see AMION for all Pharmacists' Contact Phone Numbers 02/04/2020, 12:19 PM

## 2020-02-04 NOTE — Plan of Care (Signed)
  Problem: Education: Goal: Will demonstrate proper wound care and an understanding of methods to prevent future damage Outcome: Progressing Goal: Knowledge of disease or condition will improve Outcome: Progressing Goal: Knowledge of the prescribed therapeutic regimen will improve Outcome: Progressing Goal: Individualized Educational Video(s) Outcome: Progressing   Problem: Activity: Goal: Risk for activity intolerance will decrease Outcome: Progressing   Problem: Cardiac: Goal: Will achieve and/or maintain hemodynamic stability Outcome: Progressing   Problem: Clinical Measurements: Goal: Postoperative complications will be avoided or minimized Outcome: Progressing   Problem: Respiratory: Goal: Respiratory status will improve Outcome: Progressing   Problem: Skin Integrity: Goal: Wound healing without signs and symptoms of infection Outcome: Progressing Goal: Risk for impaired skin integrity will decrease Outcome: Progressing   Problem: Urinary Elimination: Goal: Ability to achieve and maintain adequate renal perfusion and functioning will improve Outcome: Progressing   Problem: Education: Goal: Will demonstrate proper wound care and an understanding of methods to prevent future damage Outcome: Progressing Goal: Knowledge of disease or condition will improve Outcome: Progressing Goal: Knowledge of the prescribed therapeutic regimen will improve Outcome: Progressing Goal: Individualized Educational Video(s) Outcome: Progressing

## 2020-02-04 NOTE — Progress Notes (Signed)
CARDIAC REHAB PHASE I   PRE:  Rate/Rhythm: 71 SR    BP: sitting 111/69    SaO2: 99 RA  MODE:  Ambulation: 120 ft (50 ft, 70 ft)  POST:  Rate/Rhythm: 87 SR    BP: sitting 129/80     SaO2: 99 RA   Pt eager to ambulate. Stood with mod assist and verbal cues. Used rollator and gait belt, assist x2. Bent posture, fatigued and SOB with distance. Sat and rested on rollator after 50 ft, needed verbal cues on how to turn and sit. Quite unsteady after he stood and turned back around to hold to rollator (nothing to hold to). Fatigued and SOB after walk, return to recliner. VSS. Encouraged IS and more ambulation (RN sts she can walk him later).  [place], ACSM 02/04/2020 1:57 PM

## 2020-02-05 LAB — RENAL FUNCTION PANEL
Albumin: 2.9 g/dL — ABNORMAL LOW (ref 3.5–5.0)
Anion gap: 11 (ref 5–15)
BUN: 57 mg/dL — ABNORMAL HIGH (ref 8–23)
CO2: 28 mmol/L (ref 22–32)
Calcium: 8.9 mg/dL (ref 8.9–10.3)
Chloride: 97 mmol/L — ABNORMAL LOW (ref 98–111)
Creatinine, Ser: 2.41 mg/dL — ABNORMAL HIGH (ref 0.61–1.24)
GFR calc Af Amer: 27 mL/min — ABNORMAL LOW (ref >60)
GFR calc non Af Amer: 23 mL/min — ABNORMAL LOW (ref >60)
Glucose, Bld: 142 mg/dL — ABNORMAL HIGH (ref 70–99)
Phosphorus: 3.5 mg/dL (ref 2.5–4.6)
Potassium: 3.6 mmol/L (ref 3.5–5.1)
Sodium: 136 mmol/L (ref 135–145)

## 2020-02-05 LAB — CBC
HCT: 27.7 % — ABNORMAL LOW (ref 39.0–52.0)
Hemoglobin: 9.3 g/dL — ABNORMAL LOW (ref 13.0–17.0)
MCH: 31.1 pg (ref 26.0–34.0)
MCHC: 33.6 g/dL (ref 30.0–36.0)
MCV: 92.6 fL (ref 80.0–100.0)
Platelets: 397 10*3/uL (ref 150–400)
RBC: 2.99 MIL/uL — ABNORMAL LOW (ref 4.22–5.81)
RDW: 14.7 % (ref 11.5–15.5)
WBC: 13 10*3/uL — ABNORMAL HIGH (ref 4.0–10.5)
nRBC: 0 % (ref 0.0–0.2)

## 2020-02-05 LAB — HEPARIN LEVEL (UNFRACTIONATED): Heparin Unfractionated: 0.4 IU/mL (ref 0.30–0.70)

## 2020-02-05 LAB — APTT: aPTT: 53 seconds — ABNORMAL HIGH (ref 24–36)

## 2020-02-05 MED ORDER — APIXABAN 5 MG PO TABS
5.0000 mg | ORAL_TABLET | Freq: Two times a day (BID) | ORAL | Status: DC
  Administered 2020-02-05 – 2020-02-06 (×3): 5 mg via ORAL
  Filled 2020-02-05 (×3): qty 1

## 2020-02-05 MED ORDER — FUROSEMIDE 40 MG PO TABS
40.0000 mg | ORAL_TABLET | Freq: Every day | ORAL | Status: DC
  Administered 2020-02-06: 40 mg via ORAL
  Filled 2020-02-05: qty 1

## 2020-02-05 MED ORDER — APIXABAN 5 MG PO TABS: 5.0000 mg | ORAL_TABLET | Freq: Two times a day (BID) | ORAL | Status: DC

## 2020-02-05 NOTE — Progress Notes (Addendum)
Physical Therapy Treatment Patient Details Name: Bryan Dunn MRN: [id number] DOB: [birth year] Today's Date: 02/05/2020    History of Present Illness [name] is a 86 y.o. male with a hx of very remote CAD with PCI over 20 years ago in [place], HTN, HLD and neuropathy who presented to [facility] with complaints of chest pain, was positive for NSTEMI and cath demonstrated 3 vessel calcification, now s/p CABG x 4 on 01/23/20.    PT Comments    Patient progressing slowly towards PT goals. Continues to have difficulty with bed mobility and standing from all surfaces needing Mod A to assist and use of momentum. Pt requires max cues to adhere to sternal precautions for all functional mobility as pt trying to push/pull and use UEs for all tasks despite precautions. Tolerated gait training today but requires multiple seated rest breaks due to SOB and fatigue. High fall risk due to foot drop in RLE and weakness. Continue to recommend SNF. Will follow.   Follow Up Recommendations  SNF;Supervision for mobility/OOB;Supervision/Assistance - 24 hour     Equipment Recommendations  Rolling walker with 5" wheels;3in1 (PT)    Recommendations for Other Services       Precautions / Restrictions Precautions Precautions: Sternal;Fall Precaution Booklet Issued: No Precaution Comments: Cues for precautions needed Restrictions Weight Bearing Restrictions: Yes    Mobility  Bed Mobility Overal bed mobility: Needs Assistance Bed Mobility: Rolling;Sidelying to Sit Rolling: Min assist Sidelying to sit: Mod assist;HOB elevated       General bed mobility comments: Cues to refrain from pulling with UEs on rail, assist with trunk and scooting bottom to EOB.  Transfers Overall transfer level: Needs assistance Equipment used: Rolling walker (2 wheeled) Transfers: Sit to/from Stand Sit to Stand: Min assist;Mod assist         General transfer comment: Initially Mod A to stand from low surface progressing to Min A  with use of momentum, tendency for retropulsion. Stood from [employer], from chair x3. Transferred to chair post ambulation.  Ambulation/Gait Ambulation/Gait assistance: Min assist;+2 safety/equipment Gait Distance (Feet): 50 Feet (+ 24' + 30' +100') Assistive device: Rolling walker (2 wheeled) Gait Pattern/deviations: Step-through pattern;Decreased stride length;Trunk flexed;Decreased dorsiflexion - left;Steppage Gait velocity: decreased Gait velocity interpretation: 1.31 - 2.62 ft/sec, indicative of limited community ambulator General Gait Details: Slow, mildly unsteady gait with decreased foot clearance LLE secondary to foot drop utilizing steppage gait pattern; 3 seated rest breaks. 2-3/4 DOE. VSS on RA.   Stairs             Wheelchair Mobility    Modified Rankin (Stroke Patients Only)       Balance Overall balance assessment: Needs assistance Sitting-balance support: Feet supported;No upper extremity supported Sitting balance-Leahy Scale: Fair Sitting balance - Comments: Total A to donn shoes sitting EOB   Standing balance support: During functional activity Standing balance-Leahy Scale: Poor Standing balance comment: UE support for dynamic tasks.                            Cognition Arousal/Alertness: Awake/alert Behavior During Therapy: WFL for tasks assessed/performed Overall Cognitive Status: Impaired/Different from baseline Area of Impairment: Memory                     Memory: Decreased recall of precautions         General Comments: Verbally able to state no pushing/pulling but continues to perform these things, needs reminders to adhere to  precautions throughout mobility.      Exercises      General Comments General comments (skin integrity, edema, etc.): VSS on RA, Sp02 high 90s and HR in 80s bpm throughout.      Pertinent Vitals/Pain Pain Assessment: No/denies pain    Home Living                      Prior  Function            PT Goals (current goals can now be found in the care plan section) Progress towards PT goals: Progressing toward goals    Frequency    Min 3X/week      PT Plan Current plan remains appropriate    Co-evaluation              AM-PAC PT "6 Clicks" Mobility   Outcome Measure  Help needed turning from your back to your side while in a flat bed without using bedrails?: A Little Help needed moving from lying on your back to sitting on the side of a flat bed without using bedrails?: A Lot Help needed moving to and from a bed to a chair (including a wheelchair)?: A Little Help needed standing up from a chair using your arms (e.g., wheelchair or bedside chair)?: A Little Help needed to walk in hospital room?: A Little Help needed climbing 3-5 steps with a railing? : A Lot 6 Click Score: 16    End of Session Equipment Utilized During Treatment: Gait belt Activity Tolerance: Patient tolerated treatment well Patient left: in chair;with call bell/phone within reach;with chair alarm set Nurse Communication: Mobility status PT Visit Diagnosis: Other abnormalities of gait and mobility (R26.89);Difficulty in walking, not elsewhere classified (R26.2);Muscle weakness (generalized) (M62.81)     Time: 5830-9407 PT Time Calculation (min) (ACUTE ONLY): 28 min  Charges:  $Gait Training: 8-22 mins $Therapeutic Activity: 8-22 mins                     Marisa Severin, PT, DPT Acute Rehabilitation Services Pager [phone] Office [place] 02/05/2020, 12:07 PM

## 2020-02-05 NOTE — Plan of Care (Signed)
  Problem: Education: Goal: Knowledge of the prescribed therapeutic regimen will improve Outcome: Progressing   Problem: Cardiac: Goal: Will achieve and/or maintain hemodynamic stability Outcome: Progressing   Problem: Respiratory: Goal: Respiratory status will improve Outcome: Progressing

## 2020-02-05 NOTE — Progress Notes (Signed)
CARDIAC REHAB PHASE I   PRE:  Rate/Rhythm: 56 first deg    BP: sitting 112/67    SaO2: 96 RA  MODE:  Ambulation: 60 ft (40 ft, 20 ft)   POST:  Rate/Rhythm: 78 first deg    BP: sitting 139/72     SaO2: 95 RA  Pt with mod assist to stand rocking from recliner. Used rollator, gait belt, assist x2 in hall. Sat after 40 ft due to fatigue and significant SOB. Stood with min assist and began walking again however became nauseated with dry heaving. Had to sit to rest again and ultimately rolled back to room. Pt transferred to bed with mod assist x2. Feeling better now. VSS.  [phone]  Darrick Meigs CES, ACSM 02/05/2020 1:45 PM

## 2020-02-05 NOTE — Progress Notes (Signed)
Awaiting MD order to d/c R IJ central venous catheter.

## 2020-02-05 NOTE — TOC Progression Note (Signed)
Transition of Care [facility]) - Progression Note    Patient Details  Name: Bryan Dunn MRN: [id number] Date of Birth: 1930/10/25  Transition of Care [facility]) CM/SW [place] Phone Number: 02/05/2020, 12:10 PM  Clinical Narrative:     After much back and forth pt and his sons have decided that they will go to SNF at [facility]. CSW started insurance auth for pt. Reference number [id number]. Clinicals have been faxed.   Expected Discharge Plan: [place] Barriers to Discharge: Continued Medical Work up  Expected Discharge Plan and Services Expected Discharge Plan: [place] arrangements for the past 2 months: Single Family Home                                       Social Determinants of Health (SDOH) Interventions    Readmission Risk Interventions No flowsheet data found.  Emeterio Reeve, Latanya Presser, [place] Social Worker [phone]

## 2020-02-05 NOTE — Progress Notes (Signed)
ANTICOAGULATION CONSULT NOTE - Follow-Up Consult  Pharmacy Consult for Eliquis >> Heparin Indication: large LA thrombus and recent atrial fibrillation  No Known Allergies  Patient Measurements: Height: 5' 9.5" (176.5 cm) Weight: 81.5 kg (179 lb 10.8 oz) IBW/kg (Calculated) : 71.85 Heparin Dosing Weight: 79 kg  Vital Signs: Temp: 98.5 F (36.9 C) (08/10 0317) Temp Source: Oral (08/10 0317) BP: 105/63 (08/10 0317) Pulse Rate: 69 (08/10 0317)  Labs: Recent Labs    02/03/20 0322 02/03/20 0322 02/04/20 0405 02/05/20 0306  HGB 10.3*   < > 9.6* 9.3*  HCT 30.2*  --  29.1* 27.7*  PLT 423*  --  391 397  APTT 63*  --  64* 53*  HEPARINUNFRC 1.30*  --  0.78* 0.40  CREATININE 2.57*  --  2.44* 2.41*   < > = values in this interval not displayed.    Estimated Creatinine Clearance: 22 mL/min (A) (by C-G formula based on SCr of 2.41 mg/dL (H)).   Medical History: Past Medical History:  Diagnosis Date  . Benign essential HTN   . CAD (coronary artery disease)    remote PCI > 20 years ago in [place]  . HLD (hyperlipidemia)   . Neuropathy     Assessment: 86 yo M presents with large left atrial thrombus and recent atrial fibrillation. Eliquis was started during this admission on 7/29. With worsening renal function and possible dialysis, pharmacy asked to switch to IV heparin. Last dose of Eliquis given 8/5 AM. Baseline HL falsely elevated given recent Eliquis. Baseline aPTT 43. -aPTT low, heparin level trending down   Goal of Therapy:  APTT 66-86 sec Heparin level 0.3-0.7 units/ml Monitor platelets by anticoagulation protocol: Yes   Plan:  Increase heparin to 950 units/hr APTT and heparin level in 8 hours Will follow on oral anticoagulation plans  Hildred Laser, PharmD Clinical Pharmacist **Pharmacist phone directory can now be found on [email or web address] ([email or web address]).  Listed under [place].

## 2020-02-05 NOTE — Progress Notes (Addendum)
      [address]       [place] [address]             [phone]      13 Days Post-Op Procedure(s) (LRB): CORONARY ARTERY BYPASS GRAFTING (CABG), ON PUMP, TIMES FOUR, USING LEFT INTERNAL MAMMARY ARTERY AND ENDOSCOPICALLY HARVESTED LEFT GREATER SAPHENOUS VEIN (N/A) TRANSESOPHAGEAL ECHOCARDIOGRAM (TEE) (N/A) Subjective: Feels okay this morning. He was worried since his blood pressure is a little low this morning.   Objective: Vital signs in last 24 hours: Temp:  [98.1 F (36.7 C)-98.5 F (36.9 C)] 98.5 F (36.9 C) (08/10 0317) Pulse Rate:  [63-73] 69 (08/10 0317) Cardiac Rhythm: Heart block (08/10 0702) Resp:  [16-22] 19 (08/10 0600) BP: (102-123)/(60-106) 105/63 (08/10 0317) SpO2:  [97 %-100 %] 98 % (08/10 0317) Weight:  [81.5 kg] 81.5 kg (08/10 0600)     Intake/Output from previous day: 08/09 0701 - 08/10 0700 In: 607.1 [P.O.:480; I.V.:127.1] Out: 2000 [Urine:2000] Intake/Output this shift: No intake/output data recorded.  General appearance: alert, cooperative and no distress Heart: regular rate and rhythm, S1, S2 normal, no murmur, click, rub or gallop Lungs: clear to auscultation bilaterally Abdomen: soft, non-tender; bowel sounds normal; no masses,  no organomegaly Extremities: 2-3+ pitting edema in bilateral ext Wound: clean and dry  Lab Results: Recent Labs    02/04/20 0405 02/05/20 0306  WBC 13.0* 13.0*  HGB 9.6* 9.3*  HCT 29.1* 27.7*  PLT 391 397   BMET:  Recent Labs    02/04/20 0405 02/05/20 0306  NA 136 136  K 3.3* 3.6  CL 97* 97*  CO2 26 28  GLUCOSE 137* 142*  BUN 61* 57*  CREATININE 2.44* 2.41*  CALCIUM 8.7* 8.9    PT/INR: No results for input(s): LABPROT, INR in the last 72 hours. ABG    Component Value Date/Time   PHART 7.396 01/23/2020 2244   HCO3 26.2 01/23/2020 2244   TCO2 20 (L) 01/29/2020 0537   ACIDBASEDEF 4.0 (H) 01/23/2020 1333   O2SAT 98.0 01/23/2020 2244   CBG (last 3)  Recent Labs     02/03/20 1153 02/03/20 1650 02/04/20 0615  GLUCAP 163* 112* 145*    Assessment/Plan: S/P Procedure(s) (LRB): CORONARY ARTERY BYPASS GRAFTING (CABG), ON PUMP, TIMES FOUR, USING LEFT INTERNAL MAMMARY ARTERY AND ENDOSCOPICALLY HARVESTED LEFT GREATER SAPHENOUS VEIN (N/A) TRANSESOPHAGEAL ECHOCARDIOGRAM (TEE) (N/A)  1. CV-NSR in the 60s, episodes of rate-controlled afib.Continue Midodrine and Amiodarone.  BP a little low this morning with MAP of 70. 2. Pulm-tolerating room air with good oxygenation. Continue incentive spirometer. Small left pleural effusion.  3. Renal-creatinine 2.41, electrolytes okay. 4. H and H 9.3/27.7, expected acute blood loss anemia 5. Endo-blood glucose well controlled   Plan: Continue heparin, plan to start Eliquis when okay with renal, however it appears renal has signed off. He needs more diuresis, continue to ambulate in the halls as able. He does confirm that his legs are weak and he needs help to and from the bathroom. Will consult PT   LOS: 15 days    Elgie Collard 02/05/2020  Agree with above. No further need for dialysis.  Will gently diurese. We will remove Vas-Cath. Restarting Eliquis tonight. Continue physical therapy. Dispo planning.  Deriyah Kunath Bary Leriche

## 2020-02-06 DIAGNOSIS — N184 Chronic kidney disease, stage 4 (severe): Secondary | ICD-10-CM

## 2020-02-06 LAB — RENAL FUNCTION PANEL
Albumin: 2.9 g/dL — ABNORMAL LOW (ref 3.5–5.0)
Anion gap: 12 (ref 5–15)
BUN: 52 mg/dL — ABNORMAL HIGH (ref 8–23)
CO2: 26 mmol/L (ref 22–32)
Calcium: 8.9 mg/dL (ref 8.9–10.3)
Chloride: 96 mmol/L — ABNORMAL LOW (ref 98–111)
Creatinine, Ser: 2.38 mg/dL — ABNORMAL HIGH (ref 0.61–1.24)
GFR calc Af Amer: 27 mL/min — ABNORMAL LOW (ref >60)
GFR calc non Af Amer: 24 mL/min — ABNORMAL LOW (ref >60)
Glucose, Bld: 174 mg/dL — ABNORMAL HIGH (ref 70–99)
Phosphorus: 3.9 mg/dL (ref 2.5–4.6)
Potassium: 4.1 mmol/L (ref 3.5–5.1)
Sodium: 134 mmol/L — ABNORMAL LOW (ref 135–145)

## 2020-02-06 LAB — CBC
HCT: 27.8 % — ABNORMAL LOW (ref 39.0–52.0)
Hemoglobin: 9.1 g/dL — ABNORMAL LOW (ref 13.0–17.0)
MCH: 30.7 pg (ref 26.0–34.0)
MCHC: 32.7 g/dL (ref 30.0–36.0)
MCV: 93.9 fL (ref 80.0–100.0)
Platelets: 361 10*3/uL (ref 150–400)
RBC: 2.96 MIL/uL — ABNORMAL LOW (ref 4.22–5.81)
RDW: 14.6 % (ref 11.5–15.5)
WBC: 12.3 10*3/uL — ABNORMAL HIGH (ref 4.0–10.5)
nRBC: 0 % (ref 0.0–0.2)

## 2020-02-06 LAB — SARS CORONAVIRUS 2 BY RT PCR (HOSPITAL ORDER, PERFORMED IN ~~LOC~~ HOSPITAL LAB): SARS Coronavirus 2: NEGATIVE

## 2020-02-06 MED ORDER — ASPIRIN 81 MG PO TBEC: 81.0000 mg | DELAYED_RELEASE_TABLET | Freq: Every day | ORAL | 11 refills | Status: AC

## 2020-02-06 MED ORDER — MIDODRINE HCL 5 MG PO TABS: 15.0000 mg | ORAL_TABLET | Freq: Three times a day (TID) | ORAL | 1 refills | Status: DC

## 2020-02-06 MED ORDER — POTASSIUM CHLORIDE CRYS ER 20 MEQ PO TBCR: 40.0000 meq | EXTENDED_RELEASE_TABLET | Freq: Every day | ORAL | 0 refills | Status: DC

## 2020-02-06 MED ORDER — ONDANSETRON HCL 4 MG PO TABS: 4.0000 mg | ORAL_TABLET | Freq: Three times a day (TID) | ORAL | 0 refills | Status: DC | PRN

## 2020-02-06 MED ORDER — ATORVASTATIN CALCIUM 80 MG PO TABS: 80.0000 mg | ORAL_TABLET | Freq: Every day | ORAL | Status: AC

## 2020-02-06 MED ORDER — AMIODARONE HCL 200 MG PO TABS: 200.0000 mg | ORAL_TABLET | Freq: Two times a day (BID) | ORAL | Status: DC

## 2020-02-06 MED ORDER — APIXABAN 2.5 MG PO TABS: 2.5000 mg | ORAL_TABLET | Freq: Two times a day (BID) | ORAL | Status: AC

## 2020-02-06 MED ORDER — FUROSEMIDE 40 MG PO TABS: 40.0000 mg | ORAL_TABLET | Freq: Every day | ORAL | 0 refills | Status: DC

## 2020-02-06 MED ORDER — TRAMADOL HCL 50 MG PO TABS: 50.0000 mg | ORAL_TABLET | ORAL | 0 refills | Status: DC | PRN

## 2020-02-06 MED ORDER — APIXABAN 2.5 MG PO TABS: 2.5000 mg | ORAL_TABLET | Freq: Two times a day (BID) | ORAL | Status: DC

## 2020-02-06 MED ORDER — MELATONIN 3 MG PO TABS: 3.0000 mg | ORAL_TABLET | Freq: Every day | ORAL | 0 refills | Status: AC

## 2020-02-06 NOTE — Progress Notes (Signed)
CARDIAC REHAB PHASE I   Discussed IS, sternal precautions, low sodium diet, daily wts, and CRPII. Pt voiced understanding. Will refer to [place] for after his SNF stay. Might need to be referred to [facility] in [place]. [place], ACSM 02/06/2020 1:17 PM

## 2020-02-06 NOTE — Progress Notes (Signed)
Occupational Therapy Treatment Patient Details Name: Bryan Dunn MRN: [id number] DOB: 09-02-1930 Today's Date: 02/06/2020    History of present illness Bryan Dunn is a 86 y.o. male with a hx of very remote CAD with PCI over 20 years ago in [place], HTN, HLD and neuropathy who presented to [facility] with complaints of chest pain, was positive for NSTEMI and cath demonstrated 3 vessel calcification, now s/p CABG x 4 on 01/23/20.   OT comments  Pt continues to make gradual progress towards OT goals, presents seated in recliner pleasant and willing to participate in therapy session. Pt tolerating both room and hallway level distance functional mobility using RW. Pt requiring intermittent seated rest breaks given fatigue (chair follow with mobility) but is quite motivated to improve and to return to his PLOF. Pt requiring up to modA for functional transfers, totalA for LB ADL during session. He is able to verbalize some sternal precautions but requiring cues for carryover during functional/mobility tasks. Pt to benefit from continued acute OT services and continue to recommend SNF level therapies at time of discharge. Will follow.  BP start of session 105/65 (78) End of session (132/76 (92)  HR in the 70s   Follow Up Recommendations  SNF;Supervision/Assistance - 24 hour    Equipment Recommendations  3 in 1 bedside commode          Precautions / Restrictions Precautions Precautions: Sternal;Fall Precaution Booklet Issued: No Precaution Comments: Cues for precautions needed Restrictions Weight Bearing Restrictions: No       Mobility Bed Mobility               General bed mobility comments: OOB in recliner upon arrival  Transfers Overall transfer level: Needs assistance Equipment used: Rolling walker (2 wheeled) Transfers: Sit to/from Stand Sit to Stand: Mod assist;+2 safety/equipment         General transfer comment: use of momentum to rise to standing, cues for safe hand placement  with transitions, performed multiple sit<>stands from recliner     Balance Overall balance assessment: Needs assistance Sitting-balance support: Feet supported;No upper extremity supported Sitting balance-Leahy Scale: Fair     Standing balance support: Bilateral upper extremity supported;During functional activity Standing balance-Leahy Scale: Poor Standing balance comment: reliant on UE support                           ADL either performed or assessed with clinical judgement   ADL Overall ADL's : Needs assistance/impaired     Grooming: Set up;Sitting               Lower Body Dressing: Total assistance;Sitting/lateral leans;Sit to/from stand Lower Body Dressing Details (indicate cue type and reason): assist to don shoes seated in recliner              Functional mobility during ADLs: Minimal assistance;+2 for safety/equipment;Rolling walker (modA for sit<>stand) General ADL Comments: pt tolerating room and hallway level mobility, requiring seated rest breaks throughout given fatigue                        Cognition Arousal/Alertness: Awake/alert Behavior During Therapy: WFL for tasks assessed/performed Overall Cognitive Status: Impaired/Different from baseline Area of Impairment: Memory                     Memory: Decreased recall of precautions         General Comments: Verbally able to state no pushing/pulling but  needs reminders to adhere to precautions throughout mobility and functional tasks         Exercises     Shoulder Instructions       General Comments      Pertinent Vitals/ Pain       Pain Assessment: Faces Faces Pain Scale: Hurts little more Pain Location: chest and legs Pain Descriptors / Indicators: Sore;Aching Pain Intervention(s): Monitored during session;Repositioned  Home Living                                          Prior Functioning/Environment              Frequency  Min  2X/week        Progress Toward Goals  OT Goals(current goals can now be found in the care plan section)  Progress towards OT goals: Progressing toward goals  Acute Rehab OT Goals Patient Stated Goal: to get back home OT Goal Formulation: With patient Time For Goal Achievement: 02/10/20 Potential to Achieve Goals: Good ADL Goals Pt Will Perform Lower Body Dressing: with min guard assist;sitting/lateral leans;sit to/from stand;with adaptive equipment Pt Will Transfer to Toilet: with min guard assist;ambulating Pt Will Perform Toileting - Clothing Manipulation and hygiene: with min guard assist;sitting/lateral leans;sit to/from stand Pt/caregiver will Perform Home Exercise Program: Increased strength;Both right and left upper extremity;With written HEP provided Additional ADL Goal #1: Pt will increase to x10 mins of OOB ADL in standing with 1 seated rest break.  Plan Discharge plan remains appropriate    Co-evaluation                 AM-PAC OT "6 Clicks" Daily Activity     Outcome Measure   Help from another person eating meals?: None Help from another person taking care of personal grooming?: A Little Help from another person toileting, which includes using toliet, bedpan, or urinal?: A Lot Help from another person bathing (including washing, rinsing, drying)?: A Lot Help from another person to put on and taking off regular upper body clothing?: A Little Help from another person to put on and taking off regular lower body clothing?: A Lot 6 Click Score: 16    End of Session Equipment Utilized During Treatment: Gait belt;Rolling walker  OT Visit Diagnosis: Unsteadiness on feet (R26.81);Muscle weakness (generalized) (M62.81);Pain Pain - part of body:  (chest incision)   Activity Tolerance Patient tolerated treatment well   Patient Left in chair;with call bell/phone within reach   Nurse Communication Mobility status        Time: 2952-8413 OT Time Calculation  (min): 25 min  Charges: OT General Charges $OT Visit: 1 Visit OT Treatments $Self Care/Home Management : 8-22 mins $Therapeutic Activity: 8-22 mins  Lou Cal, OT Acute Rehabilitation Services Pager [phone] Office [phone]   Raymondo Band 02/06/2020, 12:44 PM

## 2020-02-06 NOTE — TOC Transition Note (Signed)
Transition of Care [facility]) - CM/SW Discharge Note   Patient Details  Name: Bryan Dunn MRN: [id number] Date of Birth: June 10, 1931  Transition of Care [facility]) CM/SW Contact:  Emeterio Reeve, [place] Phone Number: 02/06/2020, 12:27 PM   Clinical Narrative:      Pt will discharge to [facility] via ptar. Pt and his son Shanon Brow has been notified. Pts insurance auth number is [id number].   Nurse to call report to [phone].   Final next level of care: Skilled Nursing Facility Barriers to Discharge: Barriers Resolved   Patient Goals and CMS Choice Patient states their goals for this hospitalization and ongoing recovery are:: SNF CMS Medicare.gov Compare Post Acute Care list provided to:: Patient Choice offered to / list presented to : Patient  Discharge Placement              Patient chooses bed at: [facility] Patient to be transferred to facility by: [place] Name of family member notified: Delorse Lek Patient and family notified of of transfer: 02/06/20  Discharge Plan and Services                                     Social Determinants of Health (SDOH) Interventions     Readmission Risk Interventions No flowsheet data found.  Emeterio Reeve, Latanya Presser, [place] Social Worker [phone]

## 2020-02-06 NOTE — Progress Notes (Addendum)
      [address]       [place] [address]             [phone]      14 Days Post-Op Procedure(s) (LRB): CORONARY ARTERY BYPASS GRAFTING (CABG), ON PUMP, TIMES FOUR, USING LEFT INTERNAL MAMMARY ARTERY AND ENDOSCOPICALLY HARVESTED LEFT GREATER SAPHENOUS VEIN (N/A) TRANSESOPHAGEAL ECHOCARDIOGRAM (TEE) (N/A) Subjective: Feels okay today. His legs are weak and he can only walk about 50 feet before he feels like he needs to sit down. He had an episode yesterday where he got nauseated while walking and had to turn around and go back to his room.   Objective: Vital signs in last 24 hours: Temp:  [97.6 F (36.4 C)-98.3 F (36.8 C)] 98.1 F (36.7 C) (08/11 0800) Pulse Rate:  [56-66] 56 (08/11 0423) Cardiac Rhythm: Heart block (08/11 0700) Resp:  [15-19] 17 (08/11 0800) BP: (88-105)/(46-67) 88/46 (08/11 0800) SpO2:  [96 %-98 %] 96 % (08/11 0423) Weight:  [81.1 kg] 81.1 kg (08/11 0500)     Intake/Output from previous day: 08/10 0701 - 08/11 0700 In: 603 [P.O.:600; I.V.:3] Out: 1801 [Urine:1800; Stool:1] Intake/Output this shift: No intake/output data recorded.  General appearance: alert, cooperative and no distress Heart: regular rate and rhythm, S1, S2 normal, no murmur, click, rub or gallop Lungs: clear to auscultation bilaterally Abdomen: soft, non-tender; bowel sounds normal; no masses,  no organomegaly Extremities: 2+ pitting pedal edema Wound: clean and dry  Lab Results: Recent Labs    02/05/20 0306 02/06/20 0118  WBC 13.0* 12.3*  HGB 9.3* 9.1*  HCT 27.7* 27.8*  PLT 397 361   BMET:  Recent Labs    02/05/20 0306 02/06/20 0118  NA 136 134*  K 3.6 4.1  CL 97* 96*  CO2 28 26  GLUCOSE 142* 174*  BUN 57* 52*  CREATININE 2.41* 2.38*  CALCIUM 8.9 8.9    PT/INR: No results for input(s): LABPROT, INR in the last 72 hours. ABG    Component Value Date/Time   PHART 7.396 01/23/2020 2244   HCO3 26.2 01/23/2020 2244   TCO2 20 (L) 01/29/2020  0537   ACIDBASEDEF 4.0 (H) 01/23/2020 1333   O2SAT 98.0 01/23/2020 2244   CBG (last 3)  Recent Labs    02/03/20 1153 02/03/20 1650 02/04/20 0615  GLUCAP 163* 112* 145*    Assessment/Plan: S/P Procedure(s) (LRB): CORONARY ARTERY BYPASS GRAFTING (CABG), ON PUMP, TIMES FOUR, USING LEFT INTERNAL MAMMARY ARTERY AND ENDOSCOPICALLY HARVESTED LEFT GREATER SAPHENOUS VEIN (N/A) TRANSESOPHAGEAL ECHOCARDIOGRAM (TEE) (N/A)  1. CV-NSR in the 60s, Continue Midodrine and Amiodarone.  BP low at times.  2. Pulm-tolerating room air with good oxygenation. Continue incentive spirometer. Small left pleural effusion.  3. Renal-creatinine 2.41, electrolytes okay. Continue oral lasix. He is at his baseline weight but 2+ pitting edema in lower ext.  4. H and H 9.1/27.8, expected acute blood loss anemia 5. Endo-blood glucose well controlled   Plan: Continue to work on gentle diuresis for fluid overload. Watch BP closely-continue midodrine. Continue ambulation and working with PT. He will need SNF and arrangements are being made for possible discharge tomorrow.    LOS: 16 days    Bryan Dunn 02/06/2020  Agree with above. Patient is doing much better and his creatinine continues to trend down. Planning for discharge to skilled nursing facility in the morning. We will order Covid test.  Bryan Dunn

## 2020-02-06 NOTE — Progress Notes (Signed)
Progress Note  Patient Name: Bryan Dunn Date of Encounter: 02/06/2020  [facility] Cardiologist: Fransico Him, MD   Subjective   Mr. Polanco is feeling well. Still a little weak. Denies pain or SOB.   Inpatient Medications    Scheduled Meds: . amiodarone  200 mg Oral BID  . apixaban  2.5 mg Oral BID  . aspirin EC  81 mg Oral Daily   Or  . aspirin  81 mg Per Tube Daily  . atorvastatin  80 mg Oral Daily  . bisacodyl  10 mg Oral Daily   Or  . bisacodyl  10 mg Rectal Daily  . docusate sodium  200 mg Oral Daily  . ferrous gluconate  324 mg Oral Q breakfast  . furosemide  40 mg Oral Daily  . melatonin  3 mg Oral QHS  . metoCLOPramide (REGLAN) injection  5 mg Intravenous Q8H  . midodrine  15 mg Oral TID WC  . multivitamin  1 tablet Oral BID  . pantoprazole  40 mg Oral Daily  . potassium chloride  40 mEq Oral Daily  . sodium chloride flush  10-40 mL Intracatheter Q12H  . sodium chloride flush  3 mL Intravenous Q12H   Continuous Infusions: . sodium chloride     PRN Meds: sodium chloride, DULoxetine, ipratropium, levalbuterol, lip balm, ondansetron (ZOFRAN) IV, oxyCODONE, sodium chloride flush, sodium chloride flush, traMADol, white petrolatum   Vital Signs    Vitals:   02/05/20 2329 02/06/20 0423 02/06/20 0500 02/06/20 0800  BP: (!) 102/59 (!) 105/57  (!) 88/46  Pulse: 61 (!) 56    Resp: 15 19  17   Temp: 98.1 F (36.7 C) 97.7 F (36.5 C)  98.1 F (36.7 C)  TempSrc: Oral Oral  Oral  SpO2: 96% 96%    Weight:   81.1 kg   Height:        Intake/Output Summary (Last 24 hours) at 02/06/2020 1006 Last data filed at 02/06/2020 0435 Gross per 24 hour  Intake 363 ml  Output 900 ml  Net -537 ml   Last 3 Weights 02/06/2020 02/05/2020 02/04/2020  Weight (lbs) 178 lb 12.7 oz 179 lb 10.8 oz 187 lb 2.7 oz  Weight (kg) 81.1 kg 81.5 kg 84.9 kg      Telemetry    Sinus rhythm- Personally Reviewed  ECG    none  Physical Exam   GEN: No acute distress.   Neck: No  JVD Cardiac: RRR, no murmurs, rubs, or gallops. Sternal wound is healing well.  Respiratory: Clear to auscultation bilaterally. GI: Soft, nontender, non-distended  MS: No edema; No deformity. Neuro:  Nonfocal  Psych: Normal affect   Labs    High Sensitivity Troponin:   Recent Labs  Lab 01/21/20 0325 01/21/20 0702 01/21/20 0851  TROPONINIHS 1,825* 4,392* 5,974*      Chemistry Recent Labs  Lab 02/04/20 0405 02/05/20 0306 02/06/20 0118  NA 136 136 134*  K 3.3* 3.6 4.1  CL 97* 97* 96*  CO2 26 28 26   GLUCOSE 137* 142* 174*  BUN 61* 57* 52*  CREATININE 2.44* 2.41* 2.38*  CALCIUM 8.7* 8.9 8.9  ALBUMIN 2.9* 2.9* 2.9*  GFRNONAA 23* 23* 24*  GFRAA 27* 27* 27*  ANIONGAP 13 11 12      Hematology Recent Labs  Lab 02/04/20 0405 02/05/20 0306 02/06/20 0118  WBC 13.0* 13.0* 12.3*  RBC 3.12* 2.99* 2.96*  HGB 9.6* 9.3* 9.1*  HCT 29.1* 27.7* 27.8*  MCV 93.3 92.6 93.9  MCH 30.8  31.1 30.7  MCHC 33.0 33.6 32.7  RDW 14.7 14.7 14.6  PLT 391 397 361    BNP No results for input(s): BNP, PROBNP in the last 168 hours.   DDimer No results for input(s): DDIMER in the last 168 hours.   Radiology    No results found.  Cardiac Studies   Chronic catheterization (01/21/2020)  Conclusion   Heavily calcified severe three-vessel coronary artery disease.  Decreased LVEF, 35 to 40% with anteroapical moderate hypokinesis.  LVEDP 22%.  Acute on chronic combined systolic and diastolic heart failure.  30% proximal left main  Heavily calcified 95% proximal LAD followed by total occlusion in the mid vessel.  99% ostial to proximal circumflex proximal to a previously placed stent which is patent.  2 large obtuse marginal branches are noted distally.  RCA is dominant.  There is eccentric 85% possible acute lesion in the RCA.  Segmental 75 to 90% distal RCA with severe diffuse disease in the PDA and LV branches with left to right collaterals.  Large acute marginal branch supplies  collaterals around the apex to the LAD.  RECOMMENDATIONS:   Guarded prognosis given age, LV function, and multifocal high-grade three-vessel CAD.  Needs to be evaluated to determine if he is a surgical candidate.  IV nitroglycerin  Resume IV heparin  Cycle cardiac markers  Coronary Diagrams  Diagnostic Dominance: Right  Intervention  Implants    2D echocardiogram (01/21/2020)  IMPRESSIONS    1. Left ventricular ejection fraction, by estimation, is 30 to 35%. The  left ventricle has moderately decreased function. The left ventricle  demonstrates regional wall motion abnormalities (see scoring  diagram/findings for description). Left ventricular  diastolic parameters are consistent with Grade I diastolic dysfunction  (impaired relaxation).  2. Right ventricular systolic function is normal. The right ventricular  size is normal. There is mildly elevated pulmonary artery systolic  pressure. The estimated right ventricular systolic pressure is 16.6 mmHg.  3. Left atrial size was moderately dilated.  4. The mitral valve is degenerative. Mild to moderate mitral valve  regurgitation. No evidence of mitral stenosis.  5. The aortic valve was not well visualized. Aortic valve regurgitation  is mild. Mild aortic valve sclerosis is present, with no evidence of  aortic valve stenosis. Aortic regurgitation PHT measures 454 msec. Aortic  valve mean gradient measures 5.4 mmHg.  Aortic valve Vmax measures 1.49 m/s.  6. The inferior vena cava is normal in size with <50% respiratory  variability, suggesting right atrial pressure of 8 mmHg.   Conclusion(s)/Recommendation(s): Findings consistent with ischemic  cardiomyopathy.    Patient Profile     86 y.o. male married Caucasian male who has  a history remote CAD status post intervention in [place] over 20 years ago.  He has treated hypertension hyperlipidemia. Admitted with NSTEMI  Assessment & Plan    1: Coronary  artery disease-troponin is 4000.    Cath revealed severe calcified three-vessel disease with severe left main and subtotally occluded ostial circumflex, subtotally occluded mid LAD with right to left collaterals.  His anatomy is not suitable for percutaneous intervention.  He is now s/p CABG by Dr Kipp Brood. Recovering well with plans to go to Rehab in next day or so.  2: Essential hypertension-BP has been soft with orthostasis. Antihypertensive therapy on hold.   3: Hyperlipidemia-on atorvastatin with total cholesterol 181 and HDL 107.  4: Ischemic cardiomyopathy-EF 30% by 2D echo with mild to moderate MR and mild left ear.  Hopefully  this will  improve with revascularization.  He is currently on lasix daily. On follow up as outpatient will hopefully be able to resume some heart failure therapy with beta blocker. Probably not a candidate for ARB/ARNI/Aldactone due to CKD. Plan to repeat Echo in 2-3 months.  5: CKD. Stage IV. Followed by Nephrology.   6. Afib. Post op. Now in NSR on amiodarone. Given age I would recommend reducing amiodarone dose to 400 mg daily for 2 weeks then 200 mg daily. [place] Vasc score is 5. Now on Eliquis. Appropriate dose is 2.5 mg bid based on age and renal function.   CARDIOLOGY RECOMMENDATIONS:  Discharge is anticipated in the next 48 hours. Recommendations for medications and follow up:  Discharge Medications: Continue medications as they are currently listed in the [facility]. Exceptions to the above:  See above note  Follow Up: The patient's Primary Cardiologist is Fransico Him, MD   Follow up in the office in 2 week(s).  Signed,  Ilianna Bown [place], MD  10:14 AM 02/06/2020  [facility]   For questions or updates, please contact [facility] Please consult [email or web address] for contact info under        Signed, Denea Cheaney [place], MD  02/06/2020, 10:06 AM

## 2020-02-06 NOTE — Care Management Important Message (Signed)
Important Message  Patient Details  Name: Bryan Dunn MRN: [id number] Date of Birth: [birth year]   Medicare Important Message Given:  Yes     [name] 02/06/2020, 3:40 PM

## 2020-02-07 ENCOUNTER — Telehealth: Payer: Self-pay | Admitting: Physician Assistant

## 2020-02-07 ENCOUNTER — Other Ambulatory Visit: Payer: Self-pay | Admitting: Physician Assistant

## 2020-02-07 MED ORDER — APIXABAN 2.5 MG PO TABS: 2.5000 mg | ORAL_TABLET | Freq: Two times a day (BID) | ORAL | 1 refills | Status: DC

## 2020-02-07 MED ORDER — MIDODRINE HCL 5 MG PO TABS: 5.0000 mg | ORAL_TABLET | Freq: Three times a day (TID) | ORAL | 3 refills | Status: DC

## 2020-02-07 MED ORDER — POTASSIUM CHLORIDE ER 20 MEQ PO TBCR: 10.0000 meq | EXTENDED_RELEASE_TABLET | Freq: Every day | ORAL | 0 refills | Status: DC

## 2020-02-07 MED ORDER — AMIODARONE HCL 200 MG PO TABS: 200.0000 mg | ORAL_TABLET | Freq: Two times a day (BID) | ORAL | 1 refills | Status: DC

## 2020-02-07 MED ORDER — TRAMADOL HCL 50 MG PO TABS: 50.0000 mg | ORAL_TABLET | ORAL | 0 refills | Status: DC | PRN

## 2020-02-07 MED ORDER — FUROSEMIDE 40 MG PO TABS
40.0000 mg | ORAL_TABLET | Freq: Every day | ORAL | 0 refills | Status: DC
Start: 2020-02-07 — End: 2020-02-15

## 2020-02-07 MED ORDER — ATORVASTATIN CALCIUM 80 MG PO TABS
80.0000 mg | ORAL_TABLET | Freq: Every day | ORAL | 3 refills | Status: DC
Start: 2020-02-07 — End: 2020-02-15

## 2020-02-07 NOTE — Telephone Encounter (Signed)
Spoke with pts son. Pt's son voluntarily signed him out of rehab today. Pt was discharged from the hospital and admitted to rehab yesterday. Pt's son states "his rehab facility was a total nightmare and we couldn't let him stay there."  Today he is calling because he has "got to get a nurse to come to the house because I don't know what I'm doing, " specifically with wound care and ambulation.  I advised him to become established with a primary care provider and to also contact his surgeons office for recommendation.  He inquired about a "Bandage on dad's back side, like he has a bedsore." My first thought it may be a mepilex dressing applied on admission to rehab for pressure ulcer prevention. I looked through some hospital notes and did not see any staged pressure ulcer documented. Pt's son stated there was no drainage under or around the dressing. I advised pt's son he may want to carefully peel the bandage back to see if his skin is intact. He also wanted recommendation on his sternal incision. Again, I stressed he contact the surgeons office for specific incision care instructions.  He is concerned with bilateral pedal edema, documented several times in hospital notes. We discussed elevating extremities, regular ambulation, and light compression stockings.  Lastly he is concerned with ambulation and showering stating "Dad's got a dropped foot." I advised him absolutely use a shower chair and not to leave his father alone when ambulating or showering.  Pt's son understands [facility] may not be the appropriate office to request an order for home care or home PT and he should contact the surgeons office. Contact number to establish primary care with [facility] was provided.  I will forward to M. Lenze, PA for review since he is scheduled to see her later in the month.

## 2020-02-07 NOTE — Telephone Encounter (Signed)
LVM on phone # [phone] advising pt to contact his surgeon's office for PT orders. I will forward to M. Lenze to review if she would like to place the orders.

## 2020-02-07 NOTE — Social Work (Signed)
CSW received call from pts son Elta Guadeloupe on 8/12, 8:08am (left VM), 9:05am, and 10:10am.CSW spoke to [place] at 9:05 and 10:10am  Elta Guadeloupe stated that Kole left [facility] soon after arriving due to roommate. Ghassan left [facility] AMA. CSW confirmed with [facility].  Elta Guadeloupe inquired about getting his meds. CSW explained that Jaque is no longer at the hospital and the MD has rotated off and that any meds would have to come from his PCP. Mark requested [place] orders, CSW explained that Larell is unable to get new orders because he is no longer a patient. CSW recommended thfor him to call his dads PCP. Elta Guadeloupe stated he called pts nurse from yesterday that give him a number to call but stated no one answered it.   Mark expressed displeasure in not being able to get meds and HH orders from hospital. Elta Guadeloupe demanded to have the contact information of someone at the hospital, CSW gave pt number to patient experience.   Emeterio Reeve, Latanya Presser, [place] Social Worker [phone]

## 2020-02-07 NOTE — Telephone Encounter (Signed)
Patient's son returning call. 

## 2020-02-07 NOTE — Progress Notes (Signed)
Patient left SNF AMA.. contacted office for prescriptions as he was not provided any at discharge from SNF.   Medications have been sent to [facility] [place].   Ellwood Handler, PA-C

## 2020-02-07 NOTE — Telephone Encounter (Signed)
New messages:    Patient son calling stating that he had orders to go rehab. He took him out last night, because he  was not happy about what was going on there, patient has some bed sores. Patient son would like to speak with a nurse  Concerning this matter.He took his father out that place and took him home. Patient son would like for someone to call him to get new orders for rehab for his father. Please call patient son.

## 2020-02-08 ENCOUNTER — Telehealth (HOSPITAL_COMMUNITY): Payer: Self-pay

## 2020-02-08 NOTE — Telephone Encounter (Signed)
Agree with all you have done. CV surgery knows what all he will need as far as home PT, wound care and home heatlh and should be the ones to order. Thanks

## 2020-02-08 NOTE — Telephone Encounter (Signed)
I spoke with patient's son and gave him information from [place], [place].  He reports he has contacted surgeon's office.

## 2020-02-08 NOTE — Telephone Encounter (Signed)
Pt insurance is active and benefits verified through [facility] Medicare. Co-pay $0.00, DED $0.00/$0.00 met, out of pocket $2,900.00/$45.00 met, co-insurance 0%. No pre-authorization required. [name], 02/08/20 @ 3:57PM, BHE#[id number]  Will contact patient to see if he is interested in the Cardiac Rehab Program. If interested, patient will need to complete follow up appt. Once completed, patient will be contacted for scheduling upon review by the RN Navigator.

## 2020-02-11 ENCOUNTER — Other Ambulatory Visit: Payer: Self-pay

## 2020-02-11 MED ORDER — ONDANSETRON HCL 4 MG PO TABS: 4.0000 mg | ORAL_TABLET | Freq: Three times a day (TID) | ORAL | 0 refills | Status: DC | PRN

## 2020-02-12 ENCOUNTER — Telehealth: Payer: Self-pay | Admitting: Cardiology

## 2020-02-12 NOTE — Telephone Encounter (Signed)
Spoke with Estill Bamberg at [facility] who is requesting orders for home health nursing services for the patient. I advised her that the son was supposed to be in contact with the cardiothoracic surgeon's office for home health orders. She will contact them and let us know if she has any problems.

## 2020-02-12 NOTE — Telephone Encounter (Signed)
Bryan Dunn from [facility] is calling requesting nurse orders to concentrate on the heart attack he recently had as well as the CABG. Please advise.

## 2020-02-13 NOTE — Telephone Encounter (Signed)
Spoke with Darlene from [facility]. She would like clarification because the patient's discharge summary states that he should be taking midodrine 3 tablets (15 mg total) three times per day, however the prescription that was picked up states that he should be taking 1 tablet (5 mg) three times per day.  In the patient's chart it looks like both of these prescriptions were sent in and are listed in the patient's chart. These medications were sent in by [facility] office. Patient is following up with Dr. Kipp Brood there on Friday.  Darlene will contact their office for clarification.

## 2020-02-13 NOTE — Telephone Encounter (Signed)
Pt c/o medication issue:  1. Name of Medication: midodrine (PROAMATINE) 5 MG tablet  2. How are you currently taking this medication (dosage and times per day)? N/A  3. Are you having a reaction (difficulty breathing--STAT)? No  4. What is your medication issue? Darlene, Quarry manager with [facility], is requesting clarification for medication instructions. Please call to discuss.

## 2020-02-15 ENCOUNTER — Ambulatory Visit (INDEPENDENT_AMBULATORY_CARE_PROVIDER_SITE_OTHER): Payer: Self-pay | Admitting: Thoracic Surgery (Cardiothoracic Vascular Surgery)

## 2020-02-15 ENCOUNTER — Other Ambulatory Visit: Payer: Self-pay

## 2020-02-15 ENCOUNTER — Encounter: Payer: Self-pay | Admitting: Thoracic Surgery (Cardiothoracic Vascular Surgery)

## 2020-02-15 VITALS — BP 138/74 | HR 73 | Temp 98.9°F | Resp 20 | Ht 69.0 in | Wt 175.4 lb

## 2020-02-15 DIAGNOSIS — I2510 Atherosclerotic heart disease of native coronary artery without angina pectoris: Secondary | ICD-10-CM

## 2020-02-15 DIAGNOSIS — Z951 Presence of aortocoronary bypass graft: Secondary | ICD-10-CM

## 2020-02-15 MED ORDER — AMIODARONE HCL 200 MG PO TABS
200.0000 mg | ORAL_TABLET | Freq: Every day | ORAL | 1 refills | Status: DC
Start: 2020-02-15 — End: 2020-02-26

## 2020-02-15 MED ORDER — MIDODRINE HCL 5 MG PO TABS: 15.0000 mg | ORAL_TABLET | Freq: Three times a day (TID) | ORAL | 1 refills | Status: DC

## 2020-02-15 NOTE — Progress Notes (Signed)
      [address]       [place] [address]             [phone]        [place] Bertrand Medical Record #[id number] Date of Birth: 1930/07/11  Referring: Lorretta Harp, MD Primary Care: System, Pcp Not In Primary Cardiologist:Traci Radford Pax, MD  Reason for visit:   follow-up  History of Present Illness:     Mr. Dimas Millin comes in for his first follow-up appointment.  He left the skilled nursing facility AMA and his pain sounds were providing good care for him.  They have several questions about his medications.  He is working with physical therapy at home  Physical Exam: BP 138/74 (BP Location: Right Arm, Patient Position: Sitting, Cuff Size: Normal)   Pulse 73   Temp 98.9 F (37.2 C) (Temporal)   Resp 20   Ht 5\' 9"  (1.753 m)   Wt 175 lb 6.4 oz (79.6 kg)   SpO2 96% Comment: RA  BMI 25.90 kg/m   Alert NAD Incision clean.  Sternum stable Abdomen soft, ND No peripheral edema       Assessment / Plan:   86 year old male status post CABG, complicated by acute kidney injury that resolved prior to discharge. Remains on midodrine for low blood pressure. He appears to be in sinus rhythm on exam today.  He is scheduled to meet with cardiology next week Made an appointment nephrologist I will see him back in clinic in 1 month  The family did have multiple questions about the ability for him to travel back to [place].  I recommended against this given that it is more than 10-hour drive.  Once he is cleared for the surgical staff can potentially make him back to [place] for cardiac rehab.   Lajuana Matte 02/15/2020 2:18 PM

## 2020-02-20 NOTE — Progress Notes (Signed)
Cardiology Office Note    Date:  02/26/2020   ID:  Bryan Dunn, DOB [birth year], MRN [id number]  PCP:  System, Pcp Not In  Cardiologist: Bryan Him, MD EPS: None  No chief complaint on file.   History of Present Illness:  Bryan Dunn is a 86 y.o. male history remote CAD status post intervention in [place] over 20 years ago.  He has treated hypertension hyperlipidemia. Admitted with NSTEMI 01/22/2020 followed by CABG 01/23/2020, ischemic cardiomyopathy ejection fraction 30% on echo with moderate MR.  Did not tolerate beta-blockers because of hypotension and not a candidate for ARB/ARN I/Aldactone due to CKD, CKD stage IV, postop atrial fibrillation CHA2DS2-VASc equals 5 on Eliquis, hyperlipidemia.  Follow-up echo 01/30/2020 LVEF 45 to 50% with mild concentric LVH, grade 2 DD, aortic valve regurgitation was mild, mild to moderate aortic valve sclerosis, diminutive atrial kick on mitral annulus mitral inflow suggests left atrial mechanical failure stunning such as recent episode of atrial fibrillation.  Severely dilated left atrium.  Patient saw Dr. Kipp Dunn in follow-up 02/15/2020.  He had left skilled nursing home facility AMA.  They did not recommend a 10-hour drive to [place] until he was cleared by surgery.  Patient comes in today for f/u accompanied by his son who he's living with. BP running 120-130's at home, a little up today. Working with PT/OT and walking. Trying to get his strength back. Saw renal last week and Crt 1.5 per son. Was 2.38 on 02/06/20. Wanting to move back to [place].     Past Medical History:  Diagnosis Date  . Benign essential HTN   . CAD (coronary artery disease)    remote PCI > 20 years ago in [place]  . HLD (hyperlipidemia)   . Neuropathy     Past Surgical History:  Procedure Laterality Date  . CORONARY ANGIOPLASTY WITH STENT PLACEMENT    . CORONARY ARTERY BYPASS GRAFT N/A 01/23/2020   Procedure: CORONARY ARTERY BYPASS GRAFTING (CABG), ON PUMP, TIMES  FOUR, USING LEFT INTERNAL MAMMARY ARTERY AND ENDOSCOPICALLY HARVESTED LEFT GREATER SAPHENOUS VEIN;  Surgeon: Bryan Matte, MD;  Location: [facility];  Service: Open Heart Surgery;  Laterality: N/A;  SVG to PDA, SVG to OM, SVG to Diag, LIMA to LAD  . CORONARY/GRAFT ACUTE MI REVASCULARIZATION N/A 01/21/2020   Procedure: Coronary/Graft Acute MI Revascularization;  Surgeon: Bryan Crome, MD;  Location: [facility];  Service: Cardiovascular;  Laterality: N/A;  . LEFT HEART CATH AND CORONARY ANGIOGRAPHY N/A 01/21/2020   Procedure: LEFT HEART CATH AND CORONARY ANGIOGRAPHY;  Surgeon: Bryan Crome, MD;  Location: [facility];  Service: Cardiovascular;  Laterality: N/A;  . MEDIAL PARTIAL KNEE REPLACEMENT Bilateral   . TEE WITHOUT CARDIOVERSION N/A 01/23/2020   Procedure: TRANSESOPHAGEAL ECHOCARDIOGRAM (TEE);  Surgeon: Bryan Matte, MD;  Location: [facility];  Service: Open Heart Surgery;  Laterality: N/A;    Current Medications: Current Meds  Medication Sig  . amiodarone (PACERONE) 200 MG tablet Take 200 mg by mouth daily.  Marland Kitchen apixaban (ELIQUIS) 2.5 MG TABS tablet Take 1 tablet (2.5 mg total) by mouth 2 (two) times daily.  Marland Kitchen aspirin EC 81 MG EC tablet Take 1 tablet (81 mg total) by mouth daily. Swallow whole.  Marland Kitchen atorvastatin (LIPITOR) 80 MG tablet Take 1 tablet (80 mg total) by mouth daily.  . melatonin 3 MG TABS tablet Take 1 tablet (3 mg total) by mouth at bedtime.  . [DISCONTINUED] midodrine (PROAMATINE) 5 MG tablet Take 3 tablets (15  mg total) by mouth 3 (three) times daily with meals.     Allergies:   Patient has no known allergies.   Social History   Socioeconomic History  . Marital status: Married    Spouse name: Not on file  . Number of children: Not on file  . Years of education: Not on file  . Highest education level: Not on file  Occupational History  . Not on file  Tobacco Use  . Smoking status: Never Smoker  . Smokeless tobacco: Never Used  Substance and  Sexual Activity  . Alcohol use: Not on file  . Drug use: Not on file  . Sexual activity: Not on file  Other Topics Concern  . Not on file  Social History Narrative  . Not on file   Social Determinants of Health   Financial Resource Strain:   . Difficulty of Paying Living Expenses: Not on file  Food Insecurity:   . Worried About Charity fundraiser in the Last Year: Not on file  . Ran Out of Food in the Last Year: Not on file  Transportation Needs:   . Lack of Transportation (Medical): Not on file  . Lack of Transportation (Non-Medical): Not on file  Physical Activity:   . Days of Exercise per Week: Not on file  . Minutes of Exercise per Session: Not on file  Stress:   . Feeling of Stress : Not on file  Social Connections:   . Frequency of Communication with Friends and Family: Not on file  . Frequency of Social Gatherings with Friends and Family: Not on file  . Attends Religious Services: Not on file  . Active Member of Clubs or Organizations: Not on file  . Attends Archivist Meetings: Not on file  . Marital Status: Not on file     Family History:  The patient's   family history includes CAD in his father.   ROS:   Please see the history of present illness.    ROS All other systems reviewed and are negative.   PHYSICAL EXAM:   VS:  BP (!) 132/92   Pulse 80   Ht 5\' 8"  (1.727 m)   Wt 174 lb 9.6 oz (79.2 kg)   SpO2 98%   BMI 26.55 kg/m   Physical Exam  GEN: Well nourished, well developed, in no acute distress  Neck: no JVD, carotid bruits, or masses Cardiac:RRR; 1/6 systolic murmur the left sternal border Respiratory: Decreased breath sounds at the bases otherwise clear to auscultation bilaterally, normal work of breathing GI: soft, nontender, nondistended, + BS Ext: without cyanosis, clubbing, or edema, Good distal pulses bilaterally Neuro:  Alert and Oriented x 3 Psych: euthymic mood, full affect  Wt Readings from Last 3 Encounters:  02/26/20 174  lb 9.6 oz (79.2 kg)  02/15/20 175 lb 6.4 oz (79.6 kg)  02/06/20 178 lb 12.7 oz (81.1 kg)      Studies/Labs Reviewed:   EKG:  EKG is not ordered today.  The ekg reviewed from 01/29/2020 atrial fibrillation with controlled ventricular rate Recent Labs: 01/21/2020: B Natriuretic Peptide 393.8 01/23/2020: ALT 41 01/29/2020: Magnesium 2.5 02/06/2020: BUN 52; Creatinine, Ser 2.38; Hemoglobin 9.1; Platelets 361; Potassium 4.1; Sodium 134   Lipid Panel    Component Value Date/Time   CHOL 192 01/22/2020 0136   TRIG 61 01/22/2020 0136   HDL 68 01/22/2020 0136   CHOLHDL 2.8 01/22/2020 0136   VLDL 12 01/22/2020 0136   LDLCALC 112 (H) 01/22/2020  0136    Additional studies/ records that were reviewed today include:  Chronic catheterization (01/21/2020)   Conclusion    Heavily calcified severe three-vessel coronary artery disease.  Decreased LVEF, 35 to 40% with anteroapical moderate hypokinesis.  LVEDP 22%.  Acute on chronic combined systolic and diastolic heart failure.  30% proximal left main  Heavily calcified 95% proximal LAD followed by total occlusion in the mid vessel.  99% ostial to proximal circumflex proximal to a previously placed stent which is patent.  2 large obtuse marginal branches are noted distally.  RCA is dominant.  There is eccentric 85% possible acute lesion in the RCA.  Segmental 75 to 90% distal RCA with severe diffuse disease in the PDA and LV branches with left to right collaterals.  Large acute marginal branch supplies collaterals around the apex to the LAD.   RECOMMENDATIONS:    Guarded prognosis given age, LV function, and multifocal high-grade three-vessel CAD.  Needs to be evaluated to determine if he is a surgical candidate.  IV nitroglycerin  Resume IV heparin  Cycle cardiac markers   2D echocardiogram (01/21/2020)   IMPRESSIONS     1. Left ventricular ejection fraction, by estimation, is 30 to 35%. The  left ventricle has moderately decreased  function. The left ventricle  demonstrates regional wall motion abnormalities (see scoring  diagram/findings for description). Left ventricular   diastolic parameters are consistent with Grade I diastolic dysfunction  (impaired relaxation).   2. Right ventricular systolic function is normal. The right ventricular  size is normal. There is mildly elevated pulmonary artery systolic  pressure. The estimated right ventricular systolic pressure is 66.0 mmHg.   3. Left atrial size was moderately dilated.   4. The mitral valve is degenerative. Mild to moderate mitral valve  regurgitation. No evidence of mitral stenosis.   5. The aortic valve was not well visualized. Aortic valve regurgitation  is mild. Mild aortic valve sclerosis is present, with no evidence of  aortic valve stenosis. Aortic regurgitation PHT measures 454 msec. Aortic  valve mean gradient measures 5.4 mmHg.   Aortic valve Vmax measures 1.49 m/s.   6. The inferior vena cava is normal in size with <50% respiratory  variability, suggesting right atrial pressure of 8 mmHg.   Conclusion(s)/Recommendation(s): Findings consistent with ischemic  cardiomyopathy.        ASSESSMENT:    1. Coronary artery disease involving coronary bypass graft of native heart without angina pectoris   2. Ischemic cardiomyopathy   3. Paroxysmal atrial fibrillation (HCC)   4. AKI (acute kidney injury) ([place])   5. Hypotension, unspecified hypotension type      PLAN:  In order of problems listed above:  CAD status post remote intervention in [place] 20 years ago, NSTEMI/26/21 cath revealed three-vessel disease and underwent CABG 01/23/2020.  Patient is working with PT and OT and gaining his strength back.  Continue aspirin Lipitor Eliquis and amiodarone.  He has follow-up with Dr. Kipp Dunn late September to determine if he can travel back to [place].  Follow-up with Dr. Radford Pax in 2 months if he is still in [place].  Otherwise see is making  appointments with cardiology in [place].  Ischemic cardiomyopathy ejection fraction 30% on echo with mild to moderate MR and mild AS.    Not a candidate for ARB/ARN I/Aldactone due to CKD.  repeat echo showed improved LVEF 45 to 50%.  Dr. Kipp Dunn recommended that he not travel to [place] until he is completely cleared by surgery.  Postop  A. fib treated with amiodarone [place] Vascor equals 5 so on Eliquis  CKD stage IV followed by nephrology.  Son says creatinine was 1.5 last week.  Hypotension treated with midodrine.  Diastolic blood pressure 92 today.  Will decrease midodrine to 5 mg 2 tablets 3 times daily.  Son will monitor his blood pressures at home and increase if needed.  Hopefully he will be able to come off this eventually.   Medication Adjustments/Labs and Tests Ordered: Current medicines are reviewed at length with the patient today.  Concerns regarding medicines are outlined above.  Medication changes, Labs and Tests ordered today are listed in the Patient Instructions below. Patient Instructions  Medication Instructions:  Your physician has recommended you make the following change in your medication:  -- DECREASE MIDODRINE to 10 mg - Take 2 tablets (10 mg) by mouth three times daily - NEW RX SENT -- *If you need a refill on your cardiac medications before your next appointment, please call your pharmacy*  Follow-Up: At [facility], you and your health needs are our priority.  As part of our continuing mission to provide you with exceptional heart care, we have created designated Provider Care Teams.  These Care Teams include your primary Cardiologist (physician) and Advanced Practice Providers (APPs -  Physician Assistants and Nurse Practitioners) who all work together to provide you with the care you need, when you need it.  We recommend signing up for the patient portal called "MyChart".  Sign up information is provided on this After Visit Summary.  MyChart is used to connect  with patients for Virtual Visits (Telemedicine).  Patients are able to view lab/test results, encounter notes, upcoming appointments, etc.  Non-urgent messages can be sent to your provider as well.   To learn more about what you can do with MyChart, go to [email or web address].    Your next appointment:   Your physician recommends that you schedule a follow-up appointment in: 2 MONTHS with Dr. Radford Pax -- Wednesday 04/30/20 at 9:00 am -- This is a virtual visit via telephone.  The format for your next appointment:   Virtual Visit  with Bryan Him, MD        Signed, Bryan Barrios, PA-C  02/26/2020 11:49 AM    [address], [place], [place]  [address] Phone: [phone]; Fax: [phone]

## 2020-02-26 ENCOUNTER — Ambulatory Visit: Payer: Medicare Other | Admitting: Physician Assistant

## 2020-02-26 ENCOUNTER — Other Ambulatory Visit: Payer: Self-pay

## 2020-02-26 ENCOUNTER — Encounter: Payer: Self-pay | Admitting: Physician Assistant

## 2020-02-26 VITALS — BP 132/92 | HR 80 | Ht 68.0 in | Wt 174.6 lb

## 2020-02-26 DIAGNOSIS — I25810 Atherosclerosis of coronary artery bypass graft(s) without angina pectoris: Secondary | ICD-10-CM | POA: Diagnosis not present

## 2020-02-26 DIAGNOSIS — I959 Hypotension, unspecified: Secondary | ICD-10-CM

## 2020-02-26 DIAGNOSIS — N179 Acute kidney failure, unspecified: Secondary | ICD-10-CM | POA: Diagnosis not present

## 2020-02-26 DIAGNOSIS — I255 Ischemic cardiomyopathy: Secondary | ICD-10-CM | POA: Diagnosis not present

## 2020-02-26 DIAGNOSIS — I480 Paroxysmal atrial fibrillation: Secondary | ICD-10-CM | POA: Diagnosis not present

## 2020-02-26 MED ORDER — MIDODRINE HCL 5 MG PO TABS: 10.0000 mg | ORAL_TABLET | Freq: Three times a day (TID) | ORAL | 2 refills | Status: AC

## 2020-02-26 NOTE — Patient Instructions (Addendum)
Medication Instructions:  Your physician has recommended you make the following change in your medication:  -- DECREASE MIDODRINE to 10 mg - Take 2 tablets (10 mg) by mouth three times daily - NEW RX SENT -- *If you need a refill on your cardiac medications before your next appointment, please call your pharmacy*  Follow-Up: At [facility], you and your health needs are our priority.  As part of our continuing mission to provide you with exceptional heart care, we have created designated Provider Care Teams.  These Care Teams include your primary Cardiologist (physician) and Advanced Practice Providers (APPs -  Physician Assistants and Nurse Practitioners) who all work together to provide you with the care you need, when you need it.  We recommend signing up for the patient portal called "MyChart".  Sign up information is provided on this After Visit Summary.  MyChart is used to connect with patients for Virtual Visits (Telemedicine).  Patients are able to view lab/test results, encounter notes, upcoming appointments, etc.  Non-urgent messages can be sent to your provider as well.   To learn more about what you can do with MyChart, go to [email or web address].    Your next appointment:   Your physician recommends that you schedule a follow-up appointment in: 2 MONTHS with Dr. Radford Pax -- Wednesday 04/30/20 at 9:00 am -- This is a virtual visit via telephone.  The format for your next appointment:   Virtual Visit  with Fransico Him, MD

## 2020-02-27 ENCOUNTER — Telehealth: Payer: Self-pay

## 2020-02-27 NOTE — Telephone Encounter (Signed)
Bryan Dunn (son) was notified about his Dad returning to [place] this Friday. He will pick up all of Bryan Dunn records from [place] office and will set up is dad  with post op care in [place] with a Cardiac Surgeon and Cardiac rehab

## 2020-02-27 NOTE — Telephone Encounter (Signed)
-----   Message from Lajuana Matte, MD sent at 02/27/2020  8:08 AM EDT ----- Regarding: RE: moving back to [place] -Friday? Contact: [phone] That's fine.  They can go back to [place].  They need to figure out surgical clearance.  Either they need to come back, or find a Psychologist, sport and exercise that will see them in [place].  Thanks,  H. ----- Message ----- From: Marylen Ponto, LPN Sent: 10/22/621   2:38 PM EDT To: Lajuana Matte, MD Subject: moving back to [place] -Friday?                Mr Bartow son Shanon Brow [phone]) called to see if it would be ok for his dad to return to [place] this Friday. His brother from [place] is driving him..he states that they will stop every 2 hours to walk and stretch. And will stop for the night in [place]/ 1st 5 hours. I did let him know that your office note from 8/20th was against traveling that far this soon after surgery. But he want me to run it by you once more. Please advise. Linden Dolin

## 2020-03-20 ENCOUNTER — Other Ambulatory Visit: Payer: Self-pay | Admitting: Thoracic Surgery (Cardiothoracic Vascular Surgery)

## 2020-03-21 ENCOUNTER — Telehealth (INDEPENDENT_AMBULATORY_CARE_PROVIDER_SITE_OTHER): Payer: Self-pay | Admitting: Thoracic Surgery (Cardiothoracic Vascular Surgery)

## 2020-03-21 ENCOUNTER — Other Ambulatory Visit: Payer: Self-pay

## 2020-03-21 ENCOUNTER — Encounter: Payer: Self-pay | Admitting: Thoracic Surgery (Cardiothoracic Vascular Surgery)

## 2020-03-21 DIAGNOSIS — Z951 Presence of aortocoronary bypass graft: Secondary | ICD-10-CM

## 2020-03-21 NOTE — Progress Notes (Signed)
     [address]       [place] [address]             [phone]       This is a virtual visit over the phone with Bryan Dunn.  I was in the office the patient was at his house in [place].  He states that he has been doing well.  He has been getting up and using his walker.  He denies any shortness of breath or chest pain.  His incision is doing fine.  He has an appointment with the cardiologist on October 5 where he will get a referral for a local cardiac rehab facility.  I instructed the patient that he can call with any concerns or questions in regards to the incision, but the rest of his care will take place down in [place].  This visit lasted 10 minutes.

## 2020-03-28 ENCOUNTER — Other Ambulatory Visit: Payer: Self-pay | Admitting: Physician Assistant

## 2020-04-30 ENCOUNTER — Telehealth: Payer: Medicare Other | Admitting: Cardiology

## 2021-08-31 IMAGING — CT CT CHEST W/O CM
2 of 4 series · 14 of 36 positions shown, 17 images · non-contrast
Comparison: Chest x-ray from the previous day.

CLINICAL DATA: History of recent STEMI and chest pain, evaluate for
aortic disease

EXAM:
CT CHEST WITHOUT CONTRAST
TECHNIQUE: Multidetector CT imaging of the chest was performed following the
standard protocol without IV contrast.

[Series 3: chest wo · axial · 0.91mm/px · z∈[-285,-19]mm · 11 of 159 slices shown, 14 images]
[im 13/159  mediastinal]
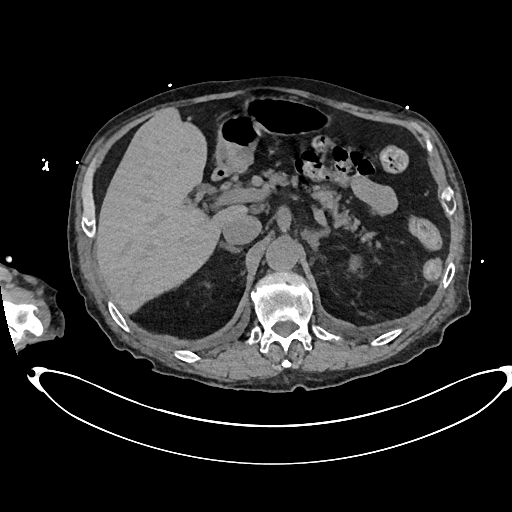
[im 13/159  lung]
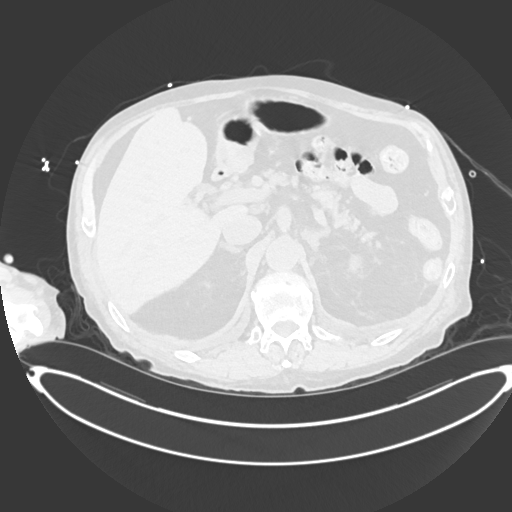
[im 25/159  lung]
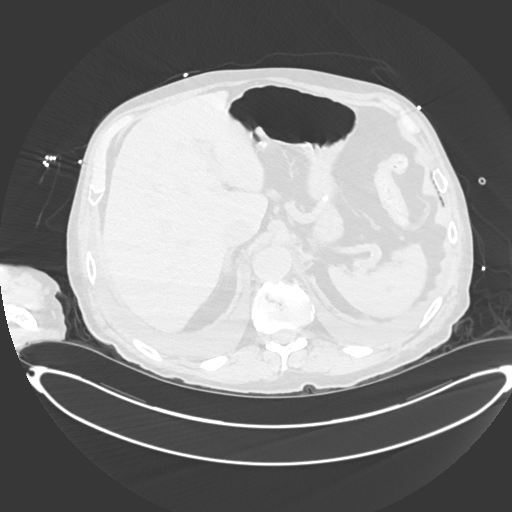
[im 37/159  lung]
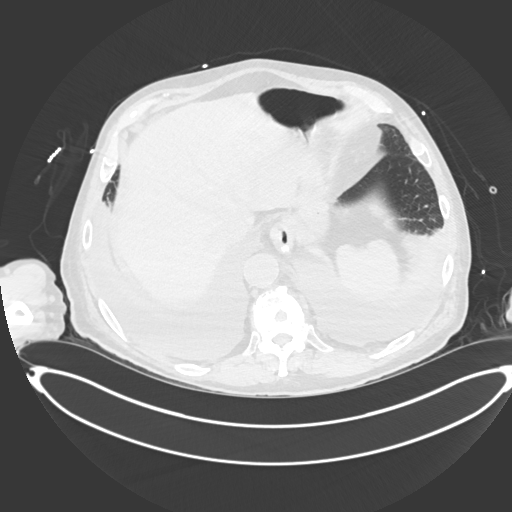
[im 49/159  lung]
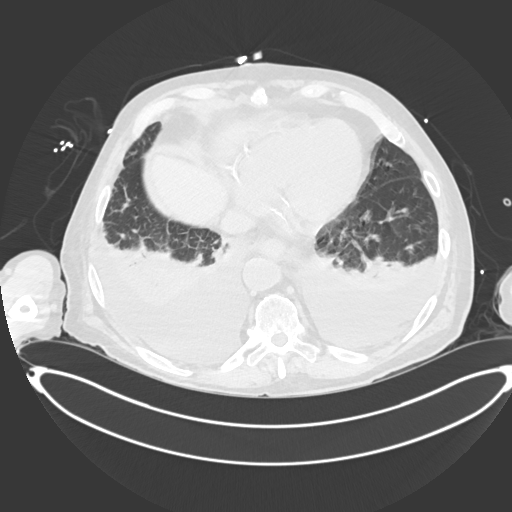
[im 61/159  mediastinal]
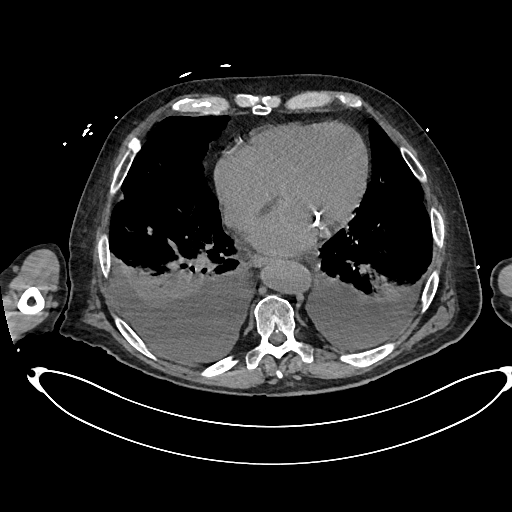
[im 61/159  lung]
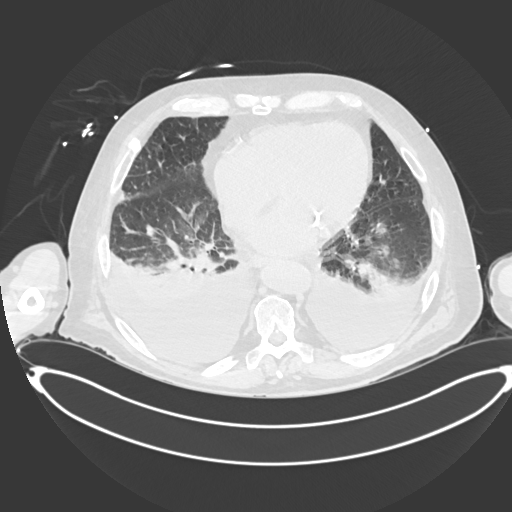
[im 86/159  lung]
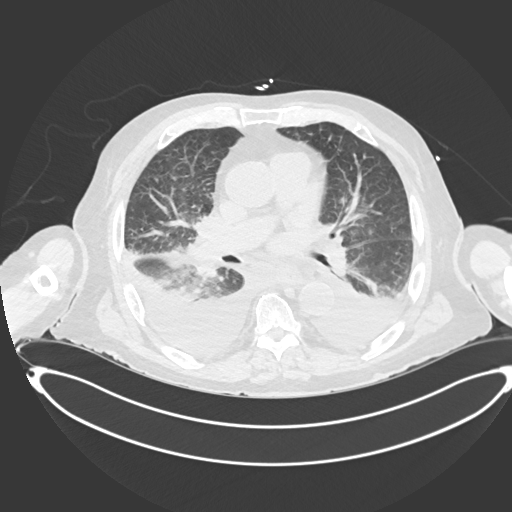
[im 98/159  lung]
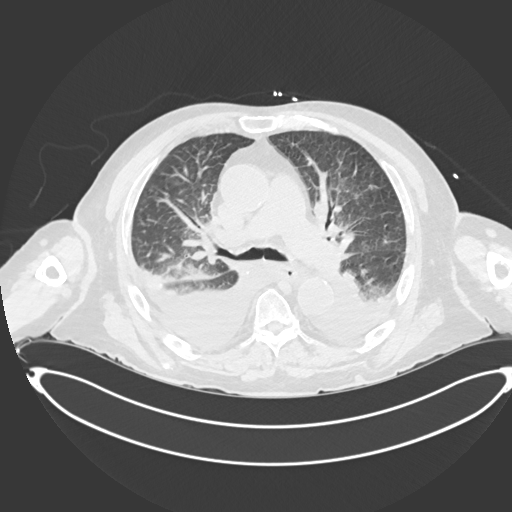
[im 110/159  lung]
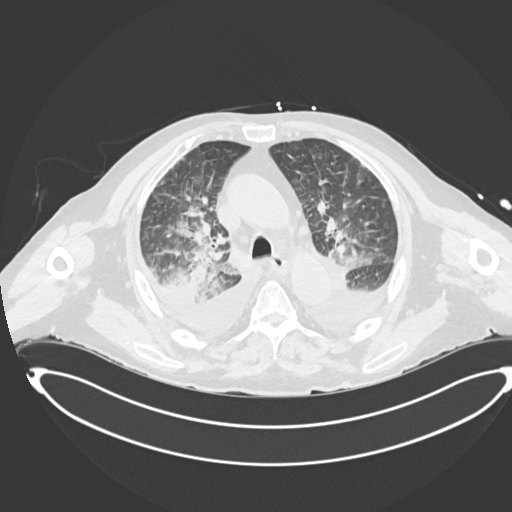
[im 122/159  mediastinal]
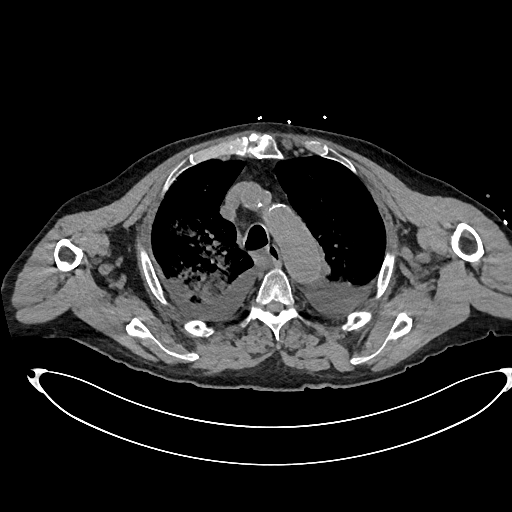
[im 122/159  lung]
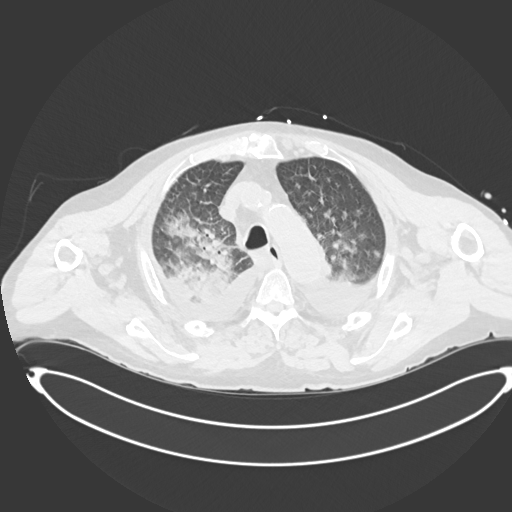
[im 134/159  lung]
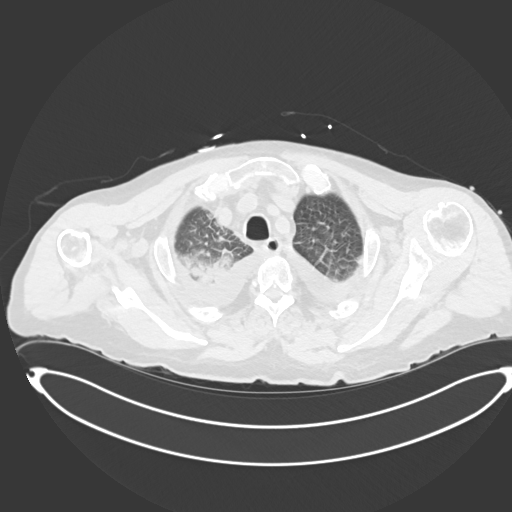
[im 146/159  lung]
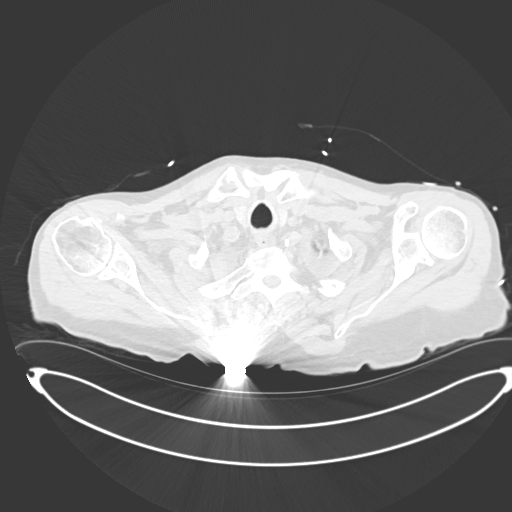

[Series 6: cor · coronal · 0.63mm/px · 3 of 160 slices shown]
[im 32/160  lung]
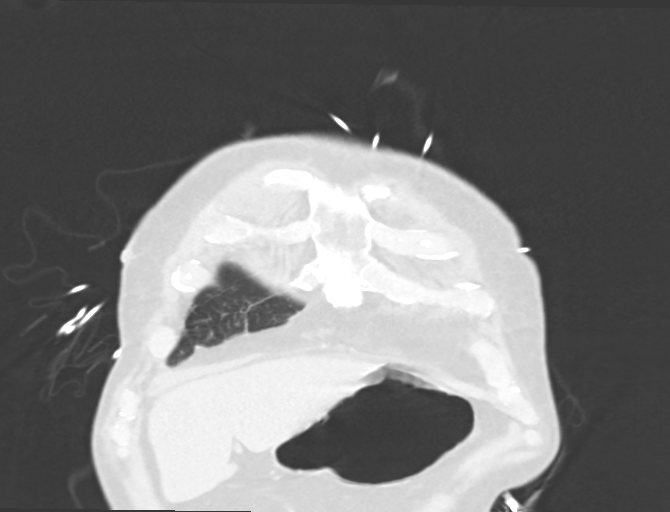
[im 64/160  lung]
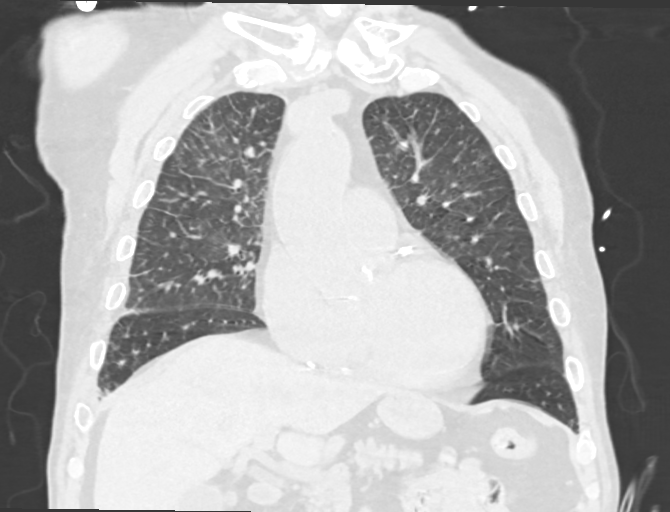
[im 96/160  lung]
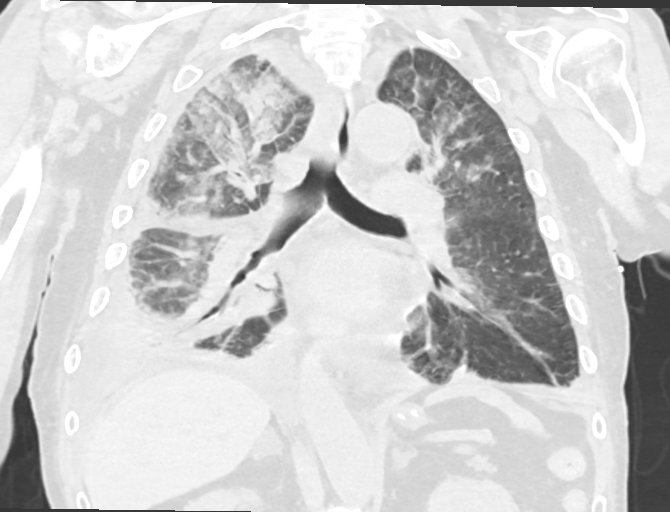

[14 of 36 positions shown; findings below may reference images not displayed]

FINDINGS: Cardiovascular: Thoracic aorta demonstrates atherosclerotic
calcifications of a mild degree. Mild dilatation of the ascending
aorta to 4.3 cm is noted. Heavy coronary calcifications are noted
consistent with the given clinical history. No cardiac enlargement
is noted. Pulmonary artery as visualized appears within normal
limits.

Mediastinum/Nodes: Thoracic inlet is unremarkable. No sizable hilar
or mediastinal adenopathy is noted. The esophagus is within normal
limits.

Lungs/Pleura: Lungs are well aerated bilaterally. Bilateral pleural
effusions are seen with mild lower lobe consolidation identified.
Interstitial thickening is noted as well as patchy central airspace
opacity most consistent with congestive failure. These changes are
slightly greater on the right than the left. They appear to have
progressed somewhat in the interval from the prior plain film
examination. No pneumothorax is seen.

Upper Abdomen: Calcification in the gallbladder wall is noted. No
other focal abnormality in the upper abdomen is seen.

Musculoskeletal: Degenerative changes of the thoracic spine are
noted.
IMPRESSION: Changes of congestive failure with bilateral pleural effusions
increased from the prior plain film examination. Lower lobe
consolidation is noted as well as pulmonary airspace and
interstitial edema.

Aortic calcifications with mild aneurysmal dilatation to 4.3 cm.
Recommend annual imaging followup by CTA or MRA. This recommendation
follows 3191 ACCF/AHA/AATS/ACR/ASA/SCA/OLI/TIGER/AYO/MESFIN Guidelines
for the Diagnosis and Management of Patients with Thoracic Aortic
Disease. Circulation. 3191; 121: [id number]. Aortic aneurysm NOS
(T3RHA-OSF.Q)

Calcification in the gallbladder wall without focal mass. This is
likely early changes of porcelain gallbladder.

Heavy coronary calcifications consistent with the given clinical
history of recent STEMI.

Aortic Atherosclerosis ([id number].B).

## 2023-06-06 ENCOUNTER — Encounter: Payer: Self-pay | Admitting: Thoracic Surgery (Cardiothoracic Vascular Surgery)
